# Patient Record
Sex: Female | Born: 1995 | Race: White | Hispanic: No | Marital: Single | Smoking: Former smoker
Health system: Southern US, Community
[De-identification: ages and names within clinical notes are randomized; demographics above are authoritative.]

## PROBLEM LIST (undated history)

## (undated) ENCOUNTER — Inpatient Hospital Stay (HOSPITAL_COMMUNITY): Payer: Self-pay

## (undated) DIAGNOSIS — R87619 Unspecified abnormal cytological findings in specimens from cervix uteri: Secondary | ICD-10-CM

## (undated) DIAGNOSIS — F419 Anxiety disorder, unspecified: Secondary | ICD-10-CM

## (undated) DIAGNOSIS — J45909 Unspecified asthma, uncomplicated: Secondary | ICD-10-CM

## (undated) DIAGNOSIS — A749 Chlamydial infection, unspecified: Secondary | ICD-10-CM

## (undated) DIAGNOSIS — IMO0002 Reserved for concepts with insufficient information to code with codable children: Secondary | ICD-10-CM

## (undated) DIAGNOSIS — N83209 Unspecified ovarian cyst, unspecified side: Secondary | ICD-10-CM

## (undated) DIAGNOSIS — E162 Hypoglycemia, unspecified: Secondary | ICD-10-CM

## (undated) DIAGNOSIS — O24419 Gestational diabetes mellitus in pregnancy, unspecified control: Secondary | ICD-10-CM

## (undated) DIAGNOSIS — A549 Gonococcal infection, unspecified: Secondary | ICD-10-CM

## (undated) HISTORY — PX: CYSTECTOMY: SHX5119

## (undated) HISTORY — PX: APPENDECTOMY: SHX54

## (undated) HISTORY — PX: WISDOM TOOTH EXTRACTION: SHX21

---
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
## 2013-03-10 ENCOUNTER — Emergency Department (INDEPENDENT_AMBULATORY_CARE_PROVIDER_SITE_OTHER)
Admission: EM | Admit: 2013-03-10 | Discharge: 2013-03-10 | Disposition: A | Discharge status: Home / Self Care | Payer: Medicaid Other | Source: Home / Self Care | Attending: Family Medicine | Admitting: Family Medicine

## 2013-03-10 ENCOUNTER — Emergency Department (INDEPENDENT_AMBULATORY_CARE_PROVIDER_SITE_OTHER): Payer: Medicaid Other

## 2013-03-10 ENCOUNTER — Encounter (HOSPITAL_COMMUNITY): Payer: Self-pay | Admitting: Emergency Medicine

## 2013-03-10 DIAGNOSIS — S60221A Contusion of right hand, initial encounter: Secondary | ICD-10-CM

## 2013-03-10 DIAGNOSIS — S60229A Contusion of unspecified hand, initial encounter: Secondary | ICD-10-CM

## 2013-03-10 MED ORDER — TRAMADOL HCL 50 MG PO TABS: 50.0000 mg | ORAL_TABLET | Freq: Four times a day (QID) | ORAL | Status: DC | PRN

## 2013-03-10 NOTE — ED Notes (Signed)
C/o right hand injury due to closing hand in car door today Tired icing hand for treatment

## 2013-03-10 NOTE — ED Provider Notes (Signed)
Brittany Williams is a 17 y.o. female who presents to Urgent Care today for right hand pain. Patient's hand was shut in a car door about 1 hour prior to presentation today. She notes pain and swelling especially across her radial wrist and metacarpal. She denies any radiating pain weakness or numbness. She took a Goody powder prior to presentation which has not helped much. No fevers or chills.   Past Medical History  Diagnosis Date  . Hypoglycemia   . Abnormal Pap smear   . Ovarian cyst   . Gonorrhea   . Chlamydia    History  Substance Use Topics  . Smoking status: Current Every Day Smoker -- 0.50 packs/day    Types: Cigarettes  . Smokeless tobacco: Not on file  . Alcohol Use: No   ROS as above Medications reviewed. No current facility-administered medications for this encounter.   Current Outpatient Prescriptions  Medication Sig Dispense Refill  . traMADol (ULTRAM) 50 MG tablet Take 1 tablet (50 mg total) by mouth every 6 (six) hours as needed.  15 tablet  0  . [DISCONTINUED] promethazine (PHENERGAN) 25 MG tablet Take 1 tablet (25 mg total) by mouth every 6 (six) hours as needed for nausea.  20 tablet  0    Exam:  BP 114/65  Pulse 80  Temp(Src) 98.4 F (36.9 C) (Oral)  Resp 14  SpO2 98%  LMP 02/24/2013 Gen: Well NAD Right hand: Swelling and ecchymosis present at the thenar eminence.  Tender across metacarpals 1-3 Tender across the wrist diffusely. Not particularly tender in the anatomical snuffbox.  Capillary refill and sensation are intact Motion of the fingers are intact thumb motion is slightly limited opposition otherwise normal.   No results found for this or any previous visit (from the past 24 hour(s)). Dg Wrist Complete Right  03/10/2013   CLINICAL DATA:  Right wrist injury and pain.  EXAM: RIGHT WRIST - COMPLETE 3+ VIEW  COMPARISON:  Plain films right hand 03/04/2007.  FINDINGS: Imaged bones, joints and soft tissues appear normal.  IMPRESSION: Negative exam.    Electronically Signed   By: Drusilla Kanner M.D.   On: 03/10/2013 13:38   Dg Hand Complete Right  03/10/2013   CLINICAL DATA:  Pain post injury  EXAM: RIGHT HAND - COMPLETE 3+ VIEW  COMPARISON:  03/04/2007  FINDINGS: Three views of right hand submitted. No acute fracture or subluxation. No radiopaque foreign body.  IMPRESSION: Negative.   Electronically Signed   By: Natasha Mead M.D.   On: 03/10/2013 13:31    Assessment and Plan: 17 y.o. female with contusion to the right hand. Plan to treat with NSAIDs, tramadol, and thumb spica splint. Followup at sports medicine if not improving.  Discussed warning signs or symptoms. Please see discharge instructions. Patient expresses understanding.      Rodolph Bong, MD 03/10/13 [phone]

## 2013-09-12 ENCOUNTER — Emergency Department (INDEPENDENT_AMBULATORY_CARE_PROVIDER_SITE_OTHER)
Admission: EM | Admit: 2013-09-12 | Discharge: 2013-09-12 | Disposition: A | Discharge status: Home / Self Care | Payer: Medicaid Other | Source: Home / Self Care

## 2013-09-12 ENCOUNTER — Encounter (HOSPITAL_COMMUNITY): Payer: Self-pay | Admitting: Emergency Medicine

## 2013-09-12 DIAGNOSIS — H109 Unspecified conjunctivitis: Secondary | ICD-10-CM

## 2013-09-12 MED ORDER — ERYTHROMYCIN 5 MG/GM OP OINT: TOPICAL_OINTMENT | OPHTHALMIC | Status: DC

## 2013-09-12 MED ORDER — KETOTIFEN FUMARATE 0.025 % OP SOLN: 1.0000 [drp] | Freq: Two times a day (BID) | OPHTHALMIC | Status: DC

## 2013-09-12 NOTE — ED Provider Notes (Signed)
CSN: [id number]     Arrival date & time 09/12/13  1215 History   First MD Initiated Contact with Patient 09/12/13 1258     Chief Complaint  Patient presents with  . Conjunctivitis   (Consider location/radiation/quality/duration/timing/severity/associated sxs/prior Treatment) HPI Comments: 2 d of bilat eye pain redness and soreness with drainage.   Past Medical History  Diagnosis Date  . Hypoglycemia   . Abnormal Pap smear   . Ovarian cyst   . Gonorrhea   . Chlamydia    Past Surgical History  Procedure Laterality Date  . Appendectomy    . Cystectomy    . Wisdom tooth extraction     Family History  Problem Relation Age of Onset  . Other Neg Hx    History  Substance Use Topics  . Smoking status: Current Every Day Smoker -- 0.50 packs/day    Types: Cigarettes  . Smokeless tobacco: Not on file  . Alcohol Use: No   OB History   Grav Para Term Preterm Abortions TAB SAB Ect Mult Living   0              Review of Systems  Constitutional: Negative.   Eyes: Positive for pain, discharge, redness and itching. Negative for visual disturbance.  All other systems reviewed and are negative.   Allergies  Latex  Home Medications   Prior to Admission medications   Medication Sig Start Date End Date Taking? Authorizing Provider  erythromycin ophthalmic ointment Place a 1/2 inch ribbon of ointment into the lower eyelid of ou tid. 09/12/13   Hayden Rasmussen, NP  ketotifen (ZADITOR) 0.025 % ophthalmic solution Place 1 drop into both eyes 2 (two) times daily. 09/12/13   Hayden Rasmussen, NP  traMADol (ULTRAM) 50 MG tablet Take 1 tablet (50 mg total) by mouth every 6 (six) hours as needed. 03/10/13   Rodolph Bong, MD   BP 108/76  Pulse 90  Temp(Src) 98.3 F (36.8 C) (Oral)  Resp 16  SpO2 98%  LMP 08/22/2013 Physical Exam  Nursing note and vitals reviewed. Constitutional: She appears well-developed and well-nourished. No distress.  Eyes: EOM are normal. Pupils are equal, round, and  reactive to light. Left eye exhibits discharge.  Bilat upper and lower lids red with inflammation, puffiness. Sclera mildly injected. Clear mucoid drainage.  Neck: Normal range of motion. Neck supple.  Neurological: She is alert.  Skin: Skin is warm and dry.    ED Course  Procedures (including critical care time) Labs Review Labs Reviewed - No data to display  Imaging Review No results found.   MDM   1. Bilateral conjunctivitis    Erythromycin op oint Warm compresses    Hayden Rasmussen, NP 09/12/13 1350

## 2013-09-12 NOTE — Discharge Instructions (Signed)

## 2013-09-12 NOTE — ED Notes (Signed)
C/O bilateral eye redness and drainage.  Irritation.    No relief with compresses.   otc meds tried.

## 2013-09-13 NOTE — ED Provider Notes (Signed)
Medical screening examination/treatment/procedure(s) were performed by non-physician practitioner and as supervising physician I was immediately available for consultation/collaboration.  Adamaris King, M.D.  Jola Critzer C Shamela Haydon, MD 09/13/13 0842 

## 2013-09-17 ENCOUNTER — Encounter (HOSPITAL_COMMUNITY): Payer: Self-pay | Admitting: Emergency Medicine

## 2013-09-17 ENCOUNTER — Emergency Department (HOSPITAL_COMMUNITY)
Admission: EM | Admit: 2013-09-17 | Discharge: 2013-09-17 | Disposition: A | Discharge status: Home / Self Care | Payer: Medicaid Other | Attending: Emergency Medicine | Admitting: Emergency Medicine

## 2013-09-17 DIAGNOSIS — H11429 Conjunctival edema, unspecified eye: Secondary | ICD-10-CM

## 2013-09-17 DIAGNOSIS — Z8619 Personal history of other infectious and parasitic diseases: Secondary | ICD-10-CM | POA: Insufficient documentation

## 2013-09-17 DIAGNOSIS — Z91040 Latex allergy status: Secondary | ICD-10-CM | POA: Insufficient documentation

## 2013-09-17 DIAGNOSIS — F17200 Nicotine dependence, unspecified, uncomplicated: Secondary | ICD-10-CM | POA: Insufficient documentation

## 2013-09-17 DIAGNOSIS — H109 Unspecified conjunctivitis: Secondary | ICD-10-CM

## 2013-09-17 DIAGNOSIS — Z862 Personal history of diseases of the blood and blood-forming organs and certain disorders involving the immune mechanism: Secondary | ICD-10-CM | POA: Insufficient documentation

## 2013-09-17 DIAGNOSIS — Z8639 Personal history of other endocrine, nutritional and metabolic disease: Secondary | ICD-10-CM | POA: Insufficient documentation

## 2013-09-17 MED ORDER — DIPHENHYDRAMINE HCL 25 MG PO CAPS
25.0000 mg | ORAL_CAPSULE | Freq: Once | ORAL | Status: AC
  Administered 2013-09-17: 25 mg via ORAL
  Filled 2013-09-17: qty 1

## 2013-09-17 MED ORDER — PROPARACAINE HCL 0.5 % OP SOLN
1.0000 [drp] | Freq: Once | OPHTHALMIC | Status: AC
  Administered 2013-09-17: 1 [drp] via OPHTHALMIC
  Filled 2013-09-17: qty 15

## 2013-09-17 MED ORDER — FLUORESCEIN SODIUM 1 MG OP STRP
1.0000 | ORAL_STRIP | Freq: Once | OPHTHALMIC | Status: AC
  Administered 2013-09-17: 1 via OPHTHALMIC
  Filled 2013-09-17: qty 1

## 2013-09-17 NOTE — ED Notes (Signed)
Pt c/o eye irritation and redness x 1 week. Pt tx for conjuctivitis on 5/30 now redness worse with swollen sclera to L eye. Pt reports burning with eye drops.

## 2013-09-17 NOTE — ED Provider Notes (Signed)
CSN: [id number]     Arrival date & time 09/17/13  0443 History   First MD Initiated Contact with Patient 09/17/13 [phone]     Chief Complaint  Patient presents with  . Eye Problem      HPI Patient states she's had some eye irritation and redness over the past week.  She was started on a bacterial eye ointment on May 30 she states that she's having some increasing redness to both of her eyes but her left greater than right.  She woke this morning and found increased swelling of her left eye conjunctiva was concerned and came to the ER for evaluation.  She reports when she puts in an antihistamine drops 2 or higher causes burning sensation of her.  She has no change in her vision.    Past Medical History  Diagnosis Date  . Hypoglycemia   . Abnormal Pap smear   . Ovarian cyst   . Gonorrhea   . Chlamydia    Past Surgical History  Procedure Laterality Date  . Appendectomy    . Cystectomy    . Wisdom tooth extraction     Family History  Problem Relation Age of Onset  . Other Neg Hx    History  Substance Use Topics  . Smoking status: Current Every Day Smoker -- 0.50 packs/day    Types: Cigarettes  . Smokeless tobacco: Not on file  . Alcohol Use: Yes   OB History   Grav Para Term Preterm Abortions TAB SAB Ect Mult Living   0              Review of Systems  All other systems reviewed and are negative.     Allergies  Latex  Home Medications   Prior to Admission medications   Not on File   BP 127/74  Pulse 101  Temp(Src) 98.4 F (36.9 C) (Oral)  Resp 18  Ht 5' (1.524 m)  Wt 135 lb (61.236 kg)  BMI 26.37 kg/m2  SpO2 99%  LMP 08/22/2013 Physical Exam  Nursing note and vitals reviewed. Constitutional: She is oriented to person, place, and time. She appears well-developed and well-nourished.  HENT:  Head: Normocephalic.  Eyes: EOM and lids are normal. Pupils are equal, round, and reactive to light. Lids are everted and swept, no foreign bodies found. Right eye  exhibits no chemosis, no discharge, no exudate and no hordeolum. No foreign body present in the right eye. Left eye exhibits chemosis. Left eye exhibits no discharge, no exudate and no hordeolum. No foreign body present in the left eye. Right conjunctiva is injected. Left conjunctiva is injected. No scleral icterus. Right eye exhibits normal extraocular motion. Left eye exhibits normal extraocular motion.  Slit lamp exam:      The left eye shows no corneal abrasion, no corneal flare, no corneal ulcer, no foreign body, no hyphema, no hypopyon and no fluorescein uptake.  Neck: Normal range of motion.  Pulmonary/Chest: Effort normal.  Abdominal: She exhibits no distension.  Musculoskeletal: Normal range of motion.  Neurological: She is alert and oriented to person, place, and time.  Psychiatric: She has a normal mood and affect.    ED Course  Procedures (including critical care time) Labs Review Labs Reviewed - No data to display  Imaging Review No results found.   EKG Interpretation None      MDM   Final diagnoses:  Chemosis  Conjunctivitis    Much of this may represent allergic reaction.  All medications we're  stopped the patient will followup with the ophthalmologist later today.  She understands return the ER for new or worsening symptoms.  The patient appears reasonably screened and/or stabilized for discharge and I doubt any other medical condition or other [facility] requiring further screening, evaluation, or treatment in the ED at this time prior to discharge.   Filed Vitals:   09/17/13 0611  BP: 127/74  Pulse: 101  Temp: 98.4 F (36.9 C)  Resp: [address]     Lyanne Co, MD 09/17/13 [phone]

## 2013-11-02 ENCOUNTER — Encounter (HOSPITAL_COMMUNITY): Payer: Self-pay | Admitting: Emergency Medicine

## 2013-11-02 ENCOUNTER — Emergency Department (HOSPITAL_COMMUNITY)
Admission: EM | Admit: 2013-11-02 | Discharge: 2013-11-02 | Disposition: A | Discharge status: Home / Self Care | Payer: Medicaid Other | Attending: Emergency Medicine | Admitting: Emergency Medicine

## 2013-11-02 DIAGNOSIS — Z23 Encounter for immunization: Secondary | ICD-10-CM | POA: Insufficient documentation

## 2013-11-02 DIAGNOSIS — A499 Bacterial infection, unspecified: Secondary | ICD-10-CM | POA: Insufficient documentation

## 2013-11-02 DIAGNOSIS — N390 Urinary tract infection, site not specified: Secondary | ICD-10-CM | POA: Diagnosis not present

## 2013-11-02 DIAGNOSIS — Y9389 Activity, other specified: Secondary | ICD-10-CM | POA: Insufficient documentation

## 2013-11-02 DIAGNOSIS — Z3202 Encounter for pregnancy test, result negative: Secondary | ICD-10-CM | POA: Diagnosis not present

## 2013-11-02 DIAGNOSIS — B9689 Other specified bacterial agents as the cause of diseases classified elsewhere: Secondary | ICD-10-CM | POA: Diagnosis not present

## 2013-11-02 DIAGNOSIS — Z792 Long term (current) use of antibiotics: Secondary | ICD-10-CM | POA: Diagnosis not present

## 2013-11-02 DIAGNOSIS — N760 Acute vaginitis: Secondary | ICD-10-CM | POA: Insufficient documentation

## 2013-11-02 DIAGNOSIS — F17200 Nicotine dependence, unspecified, uncomplicated: Secondary | ICD-10-CM | POA: Insufficient documentation

## 2013-11-02 DIAGNOSIS — T148XXA Other injury of unspecified body region, initial encounter: Secondary | ICD-10-CM

## 2013-11-02 DIAGNOSIS — Y929 Unspecified place or not applicable: Secondary | ICD-10-CM | POA: Insufficient documentation

## 2013-11-02 DIAGNOSIS — W268XXA Contact with other sharp object(s), not elsewhere classified, initial encounter: Secondary | ICD-10-CM | POA: Insufficient documentation

## 2013-11-02 DIAGNOSIS — Z79899 Other long term (current) drug therapy: Secondary | ICD-10-CM | POA: Diagnosis not present

## 2013-11-02 DIAGNOSIS — Z91040 Latex allergy status: Secondary | ICD-10-CM | POA: Diagnosis not present

## 2013-11-02 DIAGNOSIS — N949 Unspecified condition associated with female genital organs and menstrual cycle: Secondary | ICD-10-CM | POA: Diagnosis present

## 2013-11-02 DIAGNOSIS — IMO0002 Reserved for concepts with insufficient information to code with codable children: Secondary | ICD-10-CM | POA: Insufficient documentation

## 2013-11-02 LAB — WET PREP, GENITAL
Trich, Wet Prep: NONE SEEN
YEAST WET PREP: NONE SEEN

## 2013-11-02 LAB — URINALYSIS, ROUTINE W REFLEX MICROSCOPIC
Bilirubin Urine: NEGATIVE
GLUCOSE, UA: NEGATIVE mg/dL
HGB URINE DIPSTICK: NEGATIVE
KETONES UR: NEGATIVE mg/dL
Nitrite: NEGATIVE
PROTEIN: NEGATIVE mg/dL
Specific Gravity, Urine: 1.011 (ref 1.005–1.030)
Urobilinogen, UA: 0.2 mg/dL (ref 0.0–1.0)
pH: 8 (ref 5.0–8.0)

## 2013-11-02 LAB — URINE MICROSCOPIC-ADD ON

## 2013-11-02 LAB — HIV ANTIBODY (ROUTINE TESTING W REFLEX): HIV: NONREACTIVE

## 2013-11-02 LAB — RPR

## 2013-11-02 LAB — POC URINE PREG, ED: PREG TEST UR: NEGATIVE

## 2013-11-02 MED ORDER — AZITHROMYCIN 250 MG PO TABS
1000.0000 mg | ORAL_TABLET | Freq: Once | ORAL | Status: AC
  Administered 2013-11-02: 1000 mg via ORAL
  Filled 2013-11-02: qty 4

## 2013-11-02 MED ORDER — TETANUS-DIPHTH-ACELL PERTUSSIS 5-2.5-18.5 LF-MCG/0.5 IM SUSP
0.5000 mL | Freq: Once | INTRAMUSCULAR | Status: AC
  Administered 2013-11-02: 0.5 mL via INTRAMUSCULAR
  Filled 2013-11-02: qty 0.5

## 2013-11-02 MED ORDER — METRONIDAZOLE 500 MG PO TABS: 500.0000 mg | ORAL_TABLET | Freq: Two times a day (BID) | ORAL | Status: DC

## 2013-11-02 MED ORDER — CEPHALEXIN 500 MG PO CAPS: 500.0000 mg | ORAL_CAPSULE | Freq: Two times a day (BID) | ORAL | Status: DC

## 2013-11-02 MED ORDER — CEFTRIAXONE SODIUM 250 MG IJ SOLR
250.0000 mg | Freq: Once | INTRAMUSCULAR | Status: AC
  Administered 2013-11-02: 250 mg via INTRAMUSCULAR
  Filled 2013-11-02: qty 250

## 2013-11-02 NOTE — Discharge Instructions (Signed)
Please call your doctor for a followup appointment within 24-48 hours. When you talk to your doctor please let them know that you were seen in the emergency department and have them acquire all of your records so that they can discuss the findings with you and formulate a treatment plan to fully care for your new and ongoing problems. Please call and set-up an appointment with your primary care provider to be seen and re-assessed Please call and set-up an appointment with OBGYN to be re-assessed, important to continue with Pap smears Please rest and stay hydrated Please have partner checked Please take antibiotics as prescribed and take on a full stomach  Please avoid any sexual activity until infection cleared Please avoid any shaving of the area to prevent further irritation  Please apply neosporin or antibiotic ointment to aid in relief Please apply cool compressions Please continue to monitor symptoms closely and if symptoms are to worsen or change (fever greater than 101, chills, sweating, nausea, vomiting, stomach pain, diarrhea, headache, neck pain, back pain, red streaks, abscess formation, worsening or changes to vaginal discharge, vaginal pain) please report back to the ED immediately   Urinary Tract Infection Urinary tract infections (UTIs) can develop anywhere along your urinary tract. Your urinary tract is your body's drainage system for removing wastes and extra water. Your urinary tract includes two kidneys, two ureters, a bladder, and a urethra. Your kidneys are a pair of bean-shaped organs. Each kidney is about the size of your fist. They are located below your ribs, one on each side of your spine. CAUSES Infections are caused by microbes, which are microscopic organisms, including fungi, viruses, and bacteria. These organisms are so small that they can only be seen through a microscope. Bacteria are the microbes that most commonly cause UTIs. SYMPTOMS  Symptoms of UTIs may vary by  age and gender of the patient and by the location of the infection. Symptoms in young women typically include a frequent and intense urge to urinate and a painful, burning feeling in the bladder or urethra during urination. Older women and men are more likely to be tired, shaky, and weak and have muscle aches and abdominal pain. A fever may mean the infection is in your kidneys. Other symptoms of a kidney infection include pain in your back or sides below the ribs, nausea, and vomiting. DIAGNOSIS To diagnose a UTI, your caregiver will ask you about your symptoms. Your caregiver also will ask to provide a urine sample. The urine sample will be tested for bacteria and white blood cells. White blood cells are made by your body to help fight infection. TREATMENT  Typically, UTIs can be treated with medication. Because most UTIs are caused by a bacterial infection, they usually can be treated with the use of antibiotics. The choice of antibiotic and length of treatment depend on your symptoms and the type of bacteria causing your infection. HOME CARE INSTRUCTIONS  If you were prescribed antibiotics, take them exactly as your caregiver instructs you. Finish the medication even if you feel better after you have only taken some of the medication.  Drink enough water and fluids to keep your urine clear or pale yellow.  Avoid caffeine, tea, and carbonated beverages. They tend to irritate your bladder.  Empty your bladder often. Avoid holding urine for long periods of time.  Empty your bladder before and after sexual intercourse.  After a bowel movement, women should cleanse from front to back. Use each tissue only once. SEEK  MEDICAL CARE IF:   You have back pain.  You develop a fever.  Your symptoms do not begin to resolve within 3 days. SEEK IMMEDIATE MEDICAL CARE IF:   You have severe back pain or lower abdominal pain.  You develop chills.  You have nausea or vomiting.  You have continued  burning or discomfort with urination. MAKE SURE YOU:   Understand these instructions.  Will watch your condition.  Will get help right away if you are not doing well or get worse. Document Released: 01/10/2005 Document Revised: 10/02/2011 Document Reviewed: 05/11/2011 [facility] Patient Information 2015 [place], [place]. This information is not intended to replace advice given to you by your health care provider. Make sure you discuss any questions you have with your health care provider.  Bacterial Vaginosis Bacterial vaginosis is a vaginal infection that occurs when the normal balance of bacteria in the vagina is disrupted. It results from an overgrowth of certain bacteria. This is the most common vaginal infection in women of childbearing age. Treatment is important to prevent complications, especially in pregnant women, as it can cause a premature delivery. CAUSES  Bacterial vaginosis is caused by an increase in harmful bacteria that are normally present in smaller amounts in the vagina. Several different kinds of bacteria can cause bacterial vaginosis. However, the reason that the condition develops is not fully understood. RISK FACTORS Certain activities or behaviors can put you at an increased risk of developing bacterial vaginosis, including:  Having a new sex partner or multiple sex partners.  Douching.  Using an intrauterine device (IUD) for contraception. Women do not get bacterial vaginosis from toilet seats, bedding, swimming pools, or contact with objects around them. SIGNS AND SYMPTOMS  Some women with bacterial vaginosis have no signs or symptoms. Common symptoms include:  Grey vaginal discharge.  A fishlike odor with discharge, especially after sexual intercourse.  Itching or burning of the vagina and vulva.  Burning or pain with urination. DIAGNOSIS  Your health care provider will take a medical history and examine the vagina for signs of bacterial vaginosis. A  sample of vaginal fluid may be taken. Your health care provider will look at this sample under a microscope to check for bacteria and abnormal cells. A vaginal pH test may also be done.  TREATMENT  Bacterial vaginosis may be treated with antibiotic medicines. These may be given in the form of a pill or a vaginal cream. A second round of antibiotics may be prescribed if the condition comes back after treatment.  HOME CARE INSTRUCTIONS   Only take over-the-counter or prescription medicines as directed by your health care provider.  If antibiotic medicine was prescribed, take it as directed. Make sure you finish it even if you start to feel better.  Do not have sex until treatment is completed.  Tell all sexual partners that you have a vaginal infection. They should see their health care provider and be treated if they have problems, such as a mild rash or itching.  Practice safe sex by using condoms and only having one sex partner. SEEK MEDICAL CARE IF:   Your symptoms are not improving after 3 days of treatment.  You have increased discharge or pain.  You have a fever. MAKE SURE YOU:   Understand these instructions.  Will watch your condition.  Will get help right away if you are not doing well or get worse. FOR MORE INFORMATION  Centers for Disease Control and Prevention, Division of STD Prevention: SolutionApps.co.zawww.cdc.gov/std American Sexual  Health Association (ASHA): www.ashastd.org  Document Released: 04/02/2005 Document Revised: 01/21/2013 Document Reviewed: 11/12/2012 [facility] Patient Information 2015 [place], [place]. This information is not intended to replace advice given to you by your health care provider. Make sure you discuss any questions you have with your health care provider.   Emergency Department Resource Guide 1) Find a Doctor and Pay Out of Pocket Although you won't have to find out who is covered by your insurance plan, it is a good idea to ask around and get  recommendations. You will then need to call the office and see if the doctor you have chosen will accept you as a new patient and what types of options they offer for patients who are self-pay. Some doctors offer discounts or will set up payment plans for their patients who do not have insurance, but you will need to ask so you aren't surprised when you get to your appointment.  2) Contact Your Local Health Department Not all health departments have doctors that can see patients for sick visits, but many do, so it is worth a call to see if yours does. If you don't know where your local health department is, you can check in your phone book. The CDC also has a tool to help you locate your state's health department, and many state websites also have listings of all of their local health departments.  3) Find a Walk-in Clinic If your illness is not likely to be very severe or complicated, you may want to try a walk in clinic. These are popping up all over the country in pharmacies, drugstores, and shopping centers. They're usually staffed by nurse practitioners or physician assistants that have been trained to treat common illnesses and complaints. They're usually fairly quick and inexpensive. However, if you have serious medical issues or chronic medical problems, these are probably not your best option.  No Primary Care Doctor: - Call Health Connect at  [phone] - they can help you locate a primary care doctor that  accepts your insurance, provides certain services, etc. - Physician Referral Service- [phone]  Chronic Pain Problems: Organization         Address  Phone   Notes  [facility]  [phone] Patients need to be referred by their primary care doctor.   Medication Assistance: Organization         Address  Phone   Notes  [facility] Medication [facility] [address] [place] [place], [place] [address] [phone] --Must be a resident of  [facility] -- Must have NO insurance coverage whatsoever (no Medicaid/ Medicare, etc.) -- The pt. MUST have a primary care doctor that directs their care regularly and follows them in the community   MedAssist  [phone]   [employer]  [phone]    Agencies that provide inexpensive medical care: Organization         Address  Phone   Notes  [facility]  [phone]   [facility]    [phone]   [facility] [address] [place], [place] [address] [phone]   [facility] [address] [place], [place] [phone]   Planned Parenthood    [phone]   [facility]    [phone]   Community Health and [facility]  [address], [place] Phone:  [phone], Fax:  [phone] Hours of Operation:  9  am - 6 pm, M-F.  Also accepts Medicaid/Medicare and self-pay.  [facility] for [place] [place], [address], [place] Phone: [phone], Fax: [phone]. Hours of Operation:  8:30 am - 5:30 pm, M-F.  Also accepts Medicaid and self-pay.  [facility] [address], [place] Phone: [phone]   [place], [place], [place] [phone], Ext. [phone] Mondays & Thursdays: 7-9 AM.  First 15 patients are seen on a first come, first serve basis.    [place] Providers:  Organization         Address  Phone   Notes  [facility] [address], [place] [phone] Also accepts self-pay patients.  [facility] [address] [place], [place]  [phone]   [place], [address], [place] [phone]   [facility] [address], [place] [phone]   Lucianne Lei [address], [place]   [phone] Only accepts [place] Access  [place] patients after they have their name applied to their card.   Self-Pay (no insurance) in [facility]:  Organization         Address  Phone   Notes  Sickle Cell Patients, [facility] [place] [phone]   [facility] [place] [phone]   [facility] [place]  [place], [place], [place] [phone]   [facility]/Dr. Osei-Bonsu  [address], [place] or [address], [place] [phone] Phone number for both [place] and [place] locations is the same.  Urgent Medical and [facility] [address], [place] [phone]   [facility] [address], [place] or [address] [phone]   [facility] [address], [place] [phone], phone; [phone], fax Sees patients 1st and 3rd Saturday of every month.  Must not qualify for public or private insurance (i.e. Medicaid, Medicare, [place] Health Choice, Veterans' Benefits)  Household income should be no more than 200% of the poverty level The clinic cannot treat you if you are pregnant or think you are pregnant  Sexually transmitted diseases are not treated at the clinic.    Dental Care: Organization         Address  Phone  Notes  [facility] Department of [facility] [phone] Accepts children up to age 55 who are enrolled in [place] or [place]; pregnant women with a Medicaid card; and children who have applied for Medicaid or [place] Health Choice, but were declined, whose parents can pay a reduced fee at time of service.  [facility] Department of [facility]  [address], [place] [phone] Accepts children up to age 31 who are enrolled in [place] or [place]; pregnant women with a Medicaid  card; and children who have applied for Medicaid or [place] Health Choice, but were declined, whose parents can pay a reduced fee at time of service.  [place] Adult Dental Access PROGRAM  [place] [phone] Patients are seen by appointment only. Walk-ins are not accepted. [place] will see patients 18 years of  age and older. Monday - Tuesday (8am-5pm) Most Wednesdays (8:30-5pm) $30 per visit, cash only  [facility] Adult Dental Access PROGRAM  [address], [place] [facility] [phone] Patients are seen by appointment only. Walk-ins are not accepted. [place] will see patients 22 years of age and older. One Wednesday Evening (Monthly: Volunteer Based).  $30 per visit, cash only  [place]  [phone] for adults; Children under age 29, call Graduate Pediatric Dentistry at [phone]. Children aged 66-14, please call [phone] to request a pediatric application.  Dental services are provided in all areas of dental care including fillings, crowns and bridges, complete and partial dentures, implants, gum treatment, root canals, and extractions. Preventive care is also provided. Treatment is provided to both adults and children. Patients are selected via a lottery and there is often a waiting list.   [facility] [address], [place]  [phone] [email or web address]   [facility] [address] [place], [place] [phone], Ext. [phone] Second and Fourth Thursday of each month, opens at 6:30 AM; Clinic ends at 9 AM.  Patients are seen on a first-come first-served basis, and a limited number are seen during each clinic.   [facility]  [address] Hillard Danker [place], [place] [phone]   Eligibility Requirements You must have lived in [place], [place], or [place] counties for at least the last three months.   You cannot be eligible for state or federal sponsored [employer],  including [employer], [place], or [employer].   You generally cannot be eligible for healthcare insurance through your employer.    How to apply: Eligibility screenings are held every Tuesday and Wednesday afternoon from 1:00 pm until 4:00 pm. You do not need an appointment for the interview!  [facility] [address] [place], [place]   [place]  Leigh Department  [place]  [phone]    Behavioral Health Resources in the Community: Intensive Outpatient Programs Organization         Address  Phone  Notes  [place]. [address] [place], [place] [phone]   [facility] Outpatient [address] [place], [place]   ADS: Alcohol & Drug Svcs [address] [place], [place]   [place] [address],  [place], [place] or [phone]   Substance Abuse Resources Organization         Address  Phone  Notes  Alcohol and Drug Services  [phone]   [place]  [phone]   [place]   Chinita Pester  [phone]   Residential & Outpatient Substance Abuse Program  [phone]   Psychological Services Organization         Address  Phone  Notes  [employer] [place]  [place]  [phone]   [place] [address], [place] [phone] or [phone]    Mobile Crisis Teams Organization         Address  Phone  Notes  [facility], Mobile Crisis Care Unit  [phone]   [facility]  [address] [place], [place]   [facility] [address], [place] [phone]    Self-Help/Support Groups Organization         Address  Phone             Notes  Mental Health Assoc.  of [place] - variety of support groups  [place] Call for more information  Narcotics Anonymous (NA), [facility] [address], [employer] [place]  2 meetings at this location   Special educational needs teacher         Address  Phone  Notes  ASAP Residential Treatment [place],    [place]  [phone]   [facility]  [address], [place] [phone], [place], [place]   [place], [place] [phone] Admissions: 8am-3pm M-F  Incentives Substance [place] [address]    [place], [place] [phone]   [facility] [address] [place], [place], [place]   [facility] [address]  [place], [place]   [facility] Programs - Intensive Outpatient [address] Kristeen Mans [address], [place], [place]   [facility] ([place].) [place].,  [place], [place] [phone] or [phone]   [facility] Treatment Services (RTS) [address] [place], [place] Accepts Medicaid  [facility] [place] [address],  [place] [phone] Substance Abuse/Addiction Treatment   [facility] Organization         Address  Phone  Notes  [facility]  [phone]   Domenic Schwab, PhD [address] Arlis Porta [place], [place]   [phone] or [phone]   [place] Gam[place], [place] [phone]   [facility] [address] [place], [place] [phone] Insurance/Medicaid/sponsorship through [facility] and Families [address], [address]                                    [place], [place] [phone] [place] [address], [place] [phone]    Dr. Adele Schilder  [phone]   [facility] Dept. 1) [address] [place] 2)  [place] 3)  [place], [place] [phone]  [phone]   [place] [phone] or [phone] (After Hours)

## 2013-11-02 NOTE — ED Provider Notes (Signed)
CSN: [id number]     Arrival date & time 11/02/13  0545 History   First MD Initiated Contact with Patient 11/02/13 0606     Chief Complaint  Patient presents with  . Vaginal Pain     (Consider location/radiation/quality/duration/timing/severity/associated sxs/prior Treatment) The history is provided by the patient. No language interpreter was used.  Brittany Williams is a 18 y/o F with PMhx of hypoglycemia, abnormal pap smears, GC/Chlamydia, appendectomy presenting to the ED with vaginal discomfort after shaving on Friday. Patient reported that while shaving on Friday she noticed that she cut herself in her vaginal region, right sided labia majora. Stated that there has been a constant burning sensation and mild swelling noted to the region. Patient reported that the pain is worse when wearing her undergarments. Stated that wound started to itch the other day so she placed Kalamine lotion on it - stated that this made the pain worse. Reported that she placed Vasaline for comfort. Stated that she was told by her friend that her friend used the razor before her - patient reported that she normally does not share razors. Patient reported that she is sexually active and does not use protection and is not on birth control secondary to the birth control leading to abnormal Pap smear results. Patient reported that she has not followed up with a OBGYN secondary to being afraid of the outcome. Denied  fever, chills, chest pain, shortness of breath, difficulty breathing, bleeding, drainage, vaginal discharge, dysuria, hematuria, pelvic pain, nausea, vomiting, diarrhea, abdominal pain. PCP Dr. Concepcion ElkAvbuere  Past Medical History  Diagnosis Date  . Hypoglycemia   . Abnormal Pap smear   . Ovarian cyst   . Gonorrhea   . Chlamydia    Past Surgical History  Procedure Laterality Date  . Appendectomy    . Cystectomy    . Wisdom tooth extraction     Family History  Problem Relation Age of Onset  . Other Neg Hx   .  Hypertension Other   . Diabetes Other    History  Substance Use Topics  . Smoking status: Current Every Day Smoker -- 0.50 packs/day    Types: Cigarettes  . Smokeless tobacco: Not on file  . Alcohol Use: Yes     Comment: social   OB History   Grav Para Term Preterm Abortions TAB SAB Ect Mult Living   0              Review of Systems  Constitutional: Negative for fever and chills.  Respiratory: Negative for chest tightness and shortness of breath.   Cardiovascular: Negative for chest pain.  Gastrointestinal: Negative for nausea, vomiting, abdominal pain, diarrhea, constipation, blood in stool and anal bleeding.  Genitourinary: Positive for vaginal pain. Negative for dysuria, hematuria, vaginal bleeding, vaginal discharge and pelvic pain.  Musculoskeletal: Negative for back pain and neck pain.      Allergies  Latex  Home Medications   Prior to Admission medications   Medication Sig Start Date End Date Taking? Authorizing Provider  ibuprofen (ADVIL,MOTRIN) 800 MG tablet Take 800 mg by mouth every 8 (eight) hours as needed (pain).   Yes Historical Provider, MD  cephALEXin (KEFLEX) 500 MG capsule Take 1 capsule (500 mg total) by mouth 2 (two) times daily. 11/02/13   Roshan Roback, PA-C  metroNIDAZOLE (FLAGYL) 500 MG tablet Take 1 tablet (500 mg total) by mouth 2 (two) times daily. 11/02/13   Keelin Neville, PA-C   BP 103/62  Pulse 65  Temp(Src)  97.5 F (36.4 C) (Oral)  Resp 16  SpO2 100%  LMP 10/25/2013 Physical Exam  Nursing note and vitals reviewed. Constitutional: She is oriented to person, place, and time. She appears well-developed and well-nourished. No distress.  HENT:  Head: Normocephalic and atraumatic.  Mouth/Throat: Oropharynx is clear and moist. No oropharyngeal exudate.  Eyes: Conjunctivae and EOM are normal. Pupils are equal, round, and reactive to light. Right eye exhibits no discharge. Left eye exhibits no discharge.  Neck: Normal range of motion. Neck  supple. No tracheal deviation present.  Negative neck stiffness Negative nuchal rigidity  Negative cervical lymphadenopathy  Negative meningeal signs   Cardiovascular: Normal rate, regular rhythm and normal heart sounds.  Exam reveals no friction rub.   No murmur heard. Pulmonary/Chest: Effort normal and breath sounds normal. No respiratory distress. She has no wheezes. She has no rales.  Patient is able to speak in full sentences without difficulty Negative use of accessory muscles Negative stridor   Abdominal: Soft. Bowel sounds are normal. She exhibits no distension. There is no tenderness. There is no rebound and no guarding.  Negative abdominal distension BS normoactive in all 4 quadrants Abdomen soft upon palpation  Negative peritoneal signs Negative rigidity or guarding noted   Genitourinary:     Pelvic Exam: Mild erythema with superficial abrasions noted to the superior aspect of the right labia majora with negative active drainage or bleeding noted. Negative fluctuance upon palpation. Negative discomfort upon palpation to the region. Region of superficial abrasions is circular measuring approximately 0.5 cm x 0.5 cm. Negative red streaks. Negative signs of further infection. Doubt cellulitis. Negative sores, masses, deformities identified sectioning genitalia. Negative swelling, erythema, inflammation, lesions, sores, deformities, cysts noted to the vaginal canal. Cervix identified with negative friability-negative abnormalities noted. Mild thick white discharge noted nonodorous. Negative CMT. Negative bilateral adnexal tenderness. Exam chaperoned with nurse  Musculoskeletal: Normal range of motion.  Full ROM to upper and lower extremities without difficulty noted, negative ataxia noted.  Lymphadenopathy:    She has no cervical adenopathy.  Neurological: She is alert and oriented to person, place, and time. No cranial nerve deficit. She exhibits normal muscle tone. Coordination  normal.  Cranial nerves III-XII grossly intact Negative facial drooping Negative slurred speech  Negative aphasia  Skin: Skin is warm and dry. No rash noted. She is not diaphoretic. No erythema.  Psychiatric: She has a normal mood and affect. Her behavior is normal. Thought content normal.    ED Course  Procedures (including critical care time)  Results for orders placed during the hospital encounter of 11/02/13  WET PREP, GENITAL      Result Value Ref Range   Yeast Wet Prep HPF POC NONE SEEN  NONE SEEN   Trich, Wet Prep NONE SEEN  NONE SEEN   Clue Cells Wet Prep HPF POC FEW (*) NONE SEEN   WBC, Wet Prep HPF POC FEW (*) NONE SEEN  URINALYSIS, ROUTINE W REFLEX MICROSCOPIC      Result Value Ref Range   Color, Urine YELLOW  YELLOW   APPearance TURBID (*) CLEAR   Specific Gravity, Urine 1.011  1.005 - 1.030   pH 8.0  5.0 - 8.0   Glucose, UA NEGATIVE  NEGATIVE mg/dL   Hgb urine dipstick NEGATIVE  NEGATIVE   Bilirubin Urine NEGATIVE  NEGATIVE   Ketones, ur NEGATIVE  NEGATIVE mg/dL   Protein, ur NEGATIVE  NEGATIVE mg/dL   Urobilinogen, UA 0.2  0.0 - 1.0 mg/dL   Nitrite NEGATIVE  NEGATIVE   Leukocytes, UA MODERATE (*) NEGATIVE  URINE MICROSCOPIC-ADD ON      Result Value Ref Range   WBC, UA 7-10  <3 WBC/hpf   Urine-Other AMORPHOUS URATES/PHOSPHATES    POC URINE PREG, ED      Result Value Ref Range   Preg Test, Ur NEGATIVE  NEGATIVE    Labs Review Labs Reviewed  WET PREP, GENITAL - Abnormal; Notable for the following:    Clue Cells Wet Prep HPF POC FEW (*)    WBC, Wet Prep HPF POC FEW (*)    All other components within normal limits  URINALYSIS, ROUTINE W REFLEX MICROSCOPIC - Abnormal; Notable for the following:    APPearance TURBID (*)    Leukocytes, UA MODERATE (*)    All other components within normal limits  GC/CHLAMYDIA PROBE AMP  URINE MICROSCOPIC-ADD ON  RPR  HIV ANTIBODY (ROUTINE TESTING)  POC URINE PREG, ED    Imaging Review No results found.   EKG  Interpretation None      MDM   Final diagnoses:  Urinary tract infection without hematuria, site unspecified  BV (bacterial vaginosis)  Superficial abrasion   Medications  Tdap (BOOSTRIX) injection 0.5 mL (0.5 mLs Intramuscular Given 11/02/13 0752)  azithromycin (ZITHROMAX) tablet 1,000 mg (1,000 mg Oral Given 11/02/13 0751)  cefTRIAXone (ROCEPHIN) injection 250 mg (250 mg Intramuscular Given 11/02/13 0752)   Filed Vitals:   11/02/13 0550 11/02/13 0748  BP: 116/76 103/62  Pulse: 77 65  Temp: 97.7 F (36.5 C) 97.5 F (36.4 C)  TempSrc: Oral Oral  Resp: 18 16  SpO2: 100% 100%   Patient agreed to STD testing.  Urine pregnancy negative. Urinalysis noted moderate leukocytes with white blood cell count of 7-10 - negative hemoglobin, nitrites identified. Wet prep identified few clue cells and a few white blood cells. HIV, RPR, GC/Chlamydia pending. Urine culture pending. Doubt TOA. Doubt ovarian torsion. Doubt ectopic pregnancy. Doubt pyelonephritis. Doubt abscess. Patient presenting with superficial abrasion to the right labia majora measuring approximately 0.5 x 0.5 cm, circular region. Negative fluctuance upon palpation. Negative findings of cellulitis. Incidental urinary tract infection and BV identified. Negative findings of trichomoniasis. Patient sexually active will cover patient for STD. Patient given tetanus booster secondary to sharing of razors and patient does not remember the last time her tetanus was performed. Patient stable, afebrile. Patient not septic appearing. Discharged patient. Discharged patient with antibiotics to be treated for UTI and BV. Discussed with patient to apply Neosporin/bacitracin ointment. Discussed with patient to avoid shaving until fully healed. Discussed with patient the importance of Pap smears and the importance of following up with OBGYN. Referred to PCP and OBGYN. Discussed with patient to closely monitor symptoms and if symptoms are to worsen or  change to report back to the ED - strict return instructions given.  Patient agreed to plan of care, understood, all questions answered.   Raymon Mutton, PA-C 11/02/13 1635

## 2013-11-02 NOTE — ED Notes (Signed)
Patient states she cut herself while shaving. Small knick to labia noted during witness of pelvic exam. Patient denies taking in medication for discomfort PTA.

## 2013-11-02 NOTE — ED Notes (Signed)
Pt states she was shaving the other day and cut herself  Pt states the area is not healing and is very painful

## 2013-11-03 LAB — GC/CHLAMYDIA PROBE AMP
CT PROBE, AMP APTIMA: NEGATIVE
GC Probe RNA: NEGATIVE

## 2013-11-04 LAB — URINE CULTURE

## 2013-11-05 ENCOUNTER — Telehealth (HOSPITAL_BASED_OUTPATIENT_CLINIC_OR_DEPARTMENT_OTHER): Payer: Self-pay | Admitting: Emergency Medicine

## 2013-11-05 NOTE — Telephone Encounter (Signed)
Post ED Visit - Positive Culture Follow-up  Culture report reviewed by antimicrobial stewardship pharmacist: [] Wes Dulaney, Pharm.D., BCPS [] Jeremy Frens, Pharm.D., BCPS [] Elizabeth Martin, Pharm.D., BCPS [x] Minh Pham, Pharm.D., BCPS, AAHIVP [] Michelle Turner, Pharm.D., BCPS, AAHIVP  Positive urine culture Treated with Keflex, organism sensitive to the same and no further patient follow-up is required at this time.  Samella Lucchetti 11/05/2013, 6:49 PM   

## 2013-11-05 NOTE — ED Provider Notes (Signed)
Medical screening examination/treatment/procedure(s) were performed by non-physician practitioner and as supervising physician I was immediately available for consultation/collaboration.   Yides Saidi David Malaiyah Achorn III, MD 11/05/13 0859 

## 2013-12-20 ENCOUNTER — Encounter (HOSPITAL_COMMUNITY): Payer: Self-pay | Admitting: Emergency Medicine

## 2013-12-20 ENCOUNTER — Emergency Department (HOSPITAL_COMMUNITY)
Admission: EM | Admit: 2013-12-20 | Discharge: 2013-12-20 | Disposition: A | Discharge status: Home / Self Care | Payer: Medicaid Other | Attending: Emergency Medicine | Admitting: Emergency Medicine

## 2013-12-20 DIAGNOSIS — Z3202 Encounter for pregnancy test, result negative: Secondary | ICD-10-CM | POA: Insufficient documentation

## 2013-12-20 DIAGNOSIS — N390 Urinary tract infection, site not specified: Secondary | ICD-10-CM

## 2013-12-20 DIAGNOSIS — J45909 Unspecified asthma, uncomplicated: Secondary | ICD-10-CM | POA: Insufficient documentation

## 2013-12-20 DIAGNOSIS — Z8742 Personal history of other diseases of the female genital tract: Secondary | ICD-10-CM | POA: Insufficient documentation

## 2013-12-20 DIAGNOSIS — F17200 Nicotine dependence, unspecified, uncomplicated: Secondary | ICD-10-CM | POA: Insufficient documentation

## 2013-12-20 DIAGNOSIS — H9209 Otalgia, unspecified ear: Secondary | ICD-10-CM | POA: Insufficient documentation

## 2013-12-20 DIAGNOSIS — Z8639 Personal history of other endocrine, nutritional and metabolic disease: Secondary | ICD-10-CM | POA: Insufficient documentation

## 2013-12-20 DIAGNOSIS — R110 Nausea: Secondary | ICD-10-CM | POA: Insufficient documentation

## 2013-12-20 DIAGNOSIS — H9201 Otalgia, right ear: Secondary | ICD-10-CM

## 2013-12-20 DIAGNOSIS — R188 Other ascites: Secondary | ICD-10-CM | POA: Insufficient documentation

## 2013-12-20 DIAGNOSIS — R1032 Left lower quadrant pain: Secondary | ICD-10-CM

## 2013-12-20 DIAGNOSIS — Z862 Personal history of diseases of the blood and blood-forming organs and certain disorders involving the immune mechanism: Secondary | ICD-10-CM | POA: Insufficient documentation

## 2013-12-20 DIAGNOSIS — Z91040 Latex allergy status: Secondary | ICD-10-CM | POA: Insufficient documentation

## 2013-12-20 DIAGNOSIS — Z8619 Personal history of other infectious and parasitic diseases: Secondary | ICD-10-CM | POA: Insufficient documentation

## 2013-12-20 HISTORY — DX: Unspecified asthma, uncomplicated: J45.909

## 2013-12-20 LAB — URINALYSIS, ROUTINE W REFLEX MICROSCOPIC
Bilirubin Urine: NEGATIVE
GLUCOSE, UA: NEGATIVE mg/dL
HGB URINE DIPSTICK: NEGATIVE
KETONES UR: NEGATIVE mg/dL
Nitrite: NEGATIVE
Protein, ur: NEGATIVE mg/dL
SPECIFIC GRAVITY, URINE: 1.021 (ref 1.005–1.030)
Urobilinogen, UA: 0.2 mg/dL (ref 0.0–1.0)
pH: 7 (ref 5.0–8.0)

## 2013-12-20 LAB — URINE MICROSCOPIC-ADD ON

## 2013-12-20 LAB — WET PREP, GENITAL
TRICH WET PREP: NONE SEEN
YEAST WET PREP: NONE SEEN

## 2013-12-20 LAB — PREGNANCY, URINE: Preg Test, Ur: NEGATIVE

## 2013-12-20 MED ORDER — ANTIPYRINE-BENZOCAINE 5.4-1.4 % OT SOLN: 3.0000 [drp] | OTIC | Status: DC | PRN

## 2013-12-20 MED ORDER — LIDOCAINE HCL 1 % IJ SOLN
INTRAMUSCULAR | Status: AC
  Administered 2013-12-20: 0.9 mL via INTRAMUSCULAR
  Filled 2013-12-20: qty 20

## 2013-12-20 MED ORDER — AZITHROMYCIN 250 MG PO TABS
1000.0000 mg | ORAL_TABLET | Freq: Once | ORAL | Status: AC
  Administered 2013-12-20: 1000 mg via ORAL
  Filled 2013-12-20: qty 4

## 2013-12-20 MED ORDER — NITROFURANTOIN MONOHYD MACRO 100 MG PO CAPS: 100.0000 mg | ORAL_CAPSULE | Freq: Two times a day (BID) | ORAL | Status: DC

## 2013-12-20 MED ORDER — CEFTRIAXONE SODIUM 250 MG IJ SOLR
250.0000 mg | Freq: Once | INTRAMUSCULAR | Status: AC
  Administered 2013-12-20: 250 mg via INTRAMUSCULAR
  Filled 2013-12-20: qty 250

## 2013-12-20 MED ORDER — ACETAMINOPHEN 325 MG PO TABS
650.0000 mg | ORAL_TABLET | Freq: Once | ORAL | Status: AC
  Administered 2013-12-20: 650 mg via ORAL
  Filled 2013-12-20: qty 2

## 2013-12-20 MED ORDER — OXYCODONE-ACETAMINOPHEN 5-325 MG PO TABS: 1.0000 | ORAL_TABLET | Freq: Four times a day (QID) | ORAL | Status: DC | PRN

## 2013-12-20 NOTE — ED Notes (Signed)
Pt states she is having left lower quadrant pain for the past 2 weeks  Pt states the pain increases with sex and ambulation  Pt states she has an ear infection in her right ear for the past week and has a cough that she has had for the past 4 to 5 days  Pt states this morning she woke up feeling short of breath  Pt states she has been taking organic medication but it is not helping

## 2013-12-20 NOTE — ED Provider Notes (Signed)
CSN: [id number]     Arrival date & time 12/20/13  0615 History   First MD Initiated Contact with Patient 12/20/13 [phone]     Chief Complaint  Patient presents with  . Abdominal Pain  . Otalgia  . Cough     (Consider location/radiation/quality/duration/timing/severity/associated sxs/prior Treatment) Patient is a 18 y.o. female presenting with abdominal pain, ear pain, and cough. The history is provided by the patient.  Abdominal Pain Pain location:  LLQ Pain quality: sharp   Pain radiates to:  Does not radiate Pain severity:  Moderate Onset quality:  Sudden Duration:  2 weeks Timing:  Intermittent Progression:  Waxing and waning Chronicity:  Recurrent Relieved by:  Nothing Worsened by:  Movement Ineffective treatments:  None tried Associated symptoms: cough and nausea   Associated symptoms: no chest pain, no diarrhea, no dysuria, no fatigue, no fever, no hematuria, no shortness of breath and no vomiting   Otalgia Location:  Right Quality:  Aching Severity:  Mild Onset quality:  Gradual Duration:  1 week Timing:  Constant Associated symptoms: abdominal pain, congestion and cough   Associated symptoms: no diarrhea, no fever, no headaches, no neck pain and no vomiting   Cough Associated symptoms: ear pain   Associated symptoms: no chest pain, no fever, no headaches and no shortness of breath     Past Medical History  Diagnosis Date  . Hypoglycemia   . Abnormal Pap smear   . Ovarian cyst   . Gonorrhea   . Chlamydia   . Asthma    Past Surgical History  Procedure Laterality Date  . Appendectomy    . Cystectomy    . Wisdom tooth extraction     Family History  Problem Relation Age of Onset  . Other Neg Hx   . Hypertension Other   . Diabetes Other    History  Substance Use Topics  . Smoking status: Current Some Day Smoker -- 0.50 packs/day    Types: Cigarettes  . Smokeless tobacco: Not on file  . Alcohol Use: Yes     Comment: social   OB History   Grav Para  Term Preterm Abortions TAB SAB Ect Mult Living   0              Review of Systems  Constitutional: Negative for fever and fatigue.  HENT: Positive for congestion and ear pain. Negative for drooling.   Eyes: Negative for pain.  Respiratory: Positive for cough. Negative for shortness of breath.   Cardiovascular: Negative for chest pain.  Gastrointestinal: Positive for nausea and abdominal pain. Negative for vomiting and diarrhea.  Genitourinary: Negative for dysuria and hematuria.  Musculoskeletal: Negative for back pain, gait problem and neck pain.  Skin: Negative for color change.  Neurological: Negative for dizziness and headaches.  Hematological: Negative for adenopathy.  Psychiatric/Behavioral: Negative for behavioral problems.  All other systems reviewed and are negative.     Allergies  Latex  Home Medications   Prior to Admission medications   Not on File   BP 122/68  Pulse 82  Temp(Src) 98.3 F (36.8 C) (Oral)  Resp 19  Ht 5' (1.524 m)  Wt 130 lb (58.968 kg)  BMI 25.39 kg/m2  SpO2 98%  LMP 12/02/2013 Physical Exam  Nursing note and vitals reviewed. Constitutional: She is oriented to person, place, and time. She appears well-developed and well-nourished.  HENT:  Head: Normocephalic and atraumatic.  Mouth/Throat: Oropharynx is clear and moist. No oropharyngeal exudate.  Serous effusion noted behind right  TM. Normal appearing left TM.   Eyes: Conjunctivae and EOM are normal. Pupils are equal, round, and reactive to light.  Neck: Normal range of motion. Neck supple.  Cardiovascular: Normal rate, regular rhythm, normal heart sounds and intact distal pulses.  Exam reveals no gallop and no friction rub.   No murmur heard. Pulmonary/Chest: Effort normal and breath sounds normal. No respiratory distress. She has no wheezes. She exhibits tenderness (anterior chest wall ttp).  Abdominal: Soft. Bowel sounds are normal. There is tenderness (mild ttp of LLq). There is no  rebound and no guarding.  Genitourinary:  Normal appearing external vagina. Normal-appearing cervix. Os closed. Small amount of milky fluid in the posterior fornix. No significant cervical motion tenderness. Mild left adnexal tenderness to palpation.  Musculoskeletal: Normal range of motion. She exhibits no edema and no tenderness.  Neurological: She is alert and oriented to person, place, and time.  Skin: Skin is warm and dry.  Psychiatric: She has a normal mood and affect. Her behavior is normal.    ED Course  Procedures (including critical care time) Labs Review Labs Reviewed  WET PREP, GENITAL - Abnormal; Notable for the following:    Clue Cells Wet Prep HPF POC FEW (*)    WBC, Wet Prep HPF POC FEW (*)    All other components within normal limits  URINALYSIS, ROUTINE W REFLEX MICROSCOPIC - Abnormal; Notable for the following:    APPearance TURBID (*)    Leukocytes, UA MODERATE (*)    All other components within normal limits  URINE MICROSCOPIC-ADD ON - Abnormal; Notable for the following:    Squamous Epithelial / LPF MANY (*)    Bacteria, UA MANY (*)    All other components within normal limits  URINE CULTURE  GC/CHLAMYDIA PROBE AMP  PREGNANCY, URINE    Imaging Review No results found.   EKG Interpretation None      MDM   Final diagnoses:  UTI (lower urinary tract infection)  LLQ pain  Otalgia, right    7:36 AM 18 y.o. female with a history of left ovarian cyst who presents with left lower quadrant pain. She states that she developed left lower quadrant pain 2 weeks ago which has been intermittent since that time. Pain is consistent with previous ovarian cyst. She also has a self-reported history of gonorrhea and Chlamydia. She denies any fevers, vomiting, or diarrhea. She also complains of a mild cough with chest wall pain during coughing. She also complains of otalgia of the right ear and nasal congestion. She is afebrile and vital signs are unremarkable here. I  offered and ultrasound to confirm the presence of an ovarian cyst but she declined. I think this is reasonable as her abdomen is soft and her symptoms are consistent with previous ovarian cysts. Will perform pelvic exam.  8:21 AM: UA suspicious for UTI, will tx. Pt would like empiric tx for STD's, will cover w/ rocephin/azithro. LLQ pain likely ovarian cyst, will provide pain control. Do not think this is PID or ovarian torsion or TOA given well appearance, mild/intermittent sx, normal VS.  I have discussed the diagnosis/risks/treatment options with the patient and believe the pt to be eligible for discharge home to follow-up with her pcp as needed. We also discussed returning to the ED immediately if new or worsening sx occur. We discussed the sx which are most concerning (e.g., worsening pain, fever) that necessitate immediate return. Medications administered to the patient during their visit and any new prescriptions provided to  the patient are listed below.  Medications given during this visit Medications  cefTRIAXone (ROCEPHIN) injection 250 mg (not administered)  azithromycin (ZITHROMAX) tablet 1,000 mg (not administered)  acetaminophen (TYLENOL) tablet 650 mg (650 mg Oral Given 12/20/13 0741)    New Prescriptions   ANTIPYRINE-BENZOCAINE (A/B OTIC) OTIC SOLUTION    Place 3-4 drops into the right ear every 2 (two) hours as needed for ear pain.   NITROFURANTOIN, MACROCRYSTAL-MONOHYDRATE, (MACROBID) 100 MG CAPSULE    Take 1 capsule (100 mg total) by mouth 2 (two) times daily.   OXYCODONE-ACETAMINOPHEN (PERCOCET) 5-325 MG PER TABLET    Take 1 tablet by mouth every 6 (six) hours as needed for moderate pain.     Purvis Sheffield, MD 12/20/13 [phone]

## 2013-12-20 NOTE — Discharge Instructions (Signed)
Abdominal Pain, Women °Abdominal (stomach, pelvic, or belly) pain can be caused by many things. It is important to tell your doctor: °· The location of the pain. °· Does it come and go or is it present all the time? °· Are there things that start the pain (eating certain foods, exercise)? °· Are there other symptoms associated with the pain (fever, nausea, vomiting, diarrhea)? °All of this is helpful to know when trying to find the cause of the pain. °CAUSES  °· Stomach: virus or bacteria infection, or ulcer. °· Intestine: appendicitis (inflamed appendix), regional ileitis (Crohn's disease), ulcerative colitis (inflamed colon), irritable bowel syndrome, diverticulitis (inflamed diverticulum of the colon), or cancer of the stomach or intestine. °· Gallbladder disease or stones in the gallbladder. °· Kidney disease, kidney stones, or infection. °· Pancreas infection or cancer. °· Fibromyalgia (pain disorder). °· Diseases of the female organs: °¨ Uterus: fibroid (non-cancerous) tumors or infection. °¨ Fallopian tubes: infection or tubal pregnancy. °¨ Ovary: cysts or tumors. °¨ Pelvic adhesions (scar tissue). °¨ Endometriosis (uterus lining tissue growing in the pelvis and on the pelvic organs). °¨ Pelvic congestion syndrome (female organs filling up with blood just before the menstrual period). °¨ Pain with the menstrual period. °¨ Pain with ovulation (producing an egg). °¨ Pain with an IUD (intrauterine device, birth control) in the uterus. °¨ Cancer of the female organs. °· Functional pain (pain not caused by a disease, may improve without treatment). °· Psychological pain. °· Depression. °DIAGNOSIS  °Your doctor will decide the seriousness of your pain by doing an examination. °· Blood tests. °· X-rays. °· Ultrasound. °· CT scan (computed tomography, special type of X-ray). °· MRI (magnetic resonance imaging). °· Cultures, for infection. °· Barium enema (dye inserted in the large intestine, to better view it with  X-rays). °· Colonoscopy (looking in intestine with a lighted tube). °· Laparoscopy (minor surgery, looking in abdomen with a lighted tube). °· Major abdominal exploratory surgery (looking in abdomen with a large incision). °TREATMENT  °The treatment will depend on the cause of the pain.  °· Many cases can be observed and treated at home. °· Over-the-counter medicines recommended by your caregiver. °· Prescription medicine. °· Antibiotics, for infection. °· Birth control pills, for painful periods or for ovulation pain. °· Hormone treatment, for endometriosis. °· Nerve blocking injections. °· Physical therapy. °· Antidepressants. °· Counseling with a psychologist or psychiatrist. °· Minor or major surgery. °HOME CARE INSTRUCTIONS  °· Do not take laxatives, unless directed by your caregiver. °· Take over-the-counter pain medicine only if ordered by your caregiver. Do not take aspirin because it can cause an upset stomach or bleeding. °· Try a clear liquid diet (broth or water) as ordered by your caregiver. Slowly move to a bland diet, as tolerated, if the pain is related to the stomach or intestine. °· Have a thermometer and take your temperature several times a day, and record it. °· Bed rest and sleep, if it helps the pain. °· Avoid sexual intercourse, if it causes pain. °· Avoid stressful situations. °· Keep your follow-up appointments and tests, as your caregiver orders. °· If the pain does not go away with medicine or surgery, you may try: °¨ Acupuncture. °¨ Relaxation exercises (yoga, meditation). °¨ Group therapy. °¨ Counseling. °SEEK MEDICAL CARE IF:  °· You notice certain foods cause stomach pain. °· Your home care treatment is not helping your pain. °· You need stronger pain medicine. °· You want your IUD removed. °· You feel faint or   lightheaded. °· You develop nausea and vomiting. °· You develop a rash. °· You are having side effects or an allergy to your medicine. °SEEK IMMEDIATE MEDICAL CARE IF:  °· Your  pain does not go away or gets worse. °· You have a fever. °· Your pain is felt only in portions of the abdomen. The right side could possibly be appendicitis. The left lower portion of the abdomen could be colitis or diverticulitis. °· You are passing blood in your stools (bright red or black tarry stools, with or without vomiting). °· You have blood in your urine. °· You develop chills, with or without a fever. °· You pass out. °MAKE SURE YOU:  °· Understand these instructions. °· Will watch your condition. °· Will get help right away if you are not doing well or get worse. °Document Released: 01/28/2007 Document Revised: 08/17/2013 Document Reviewed: 02/17/2009 °[employer]® Patient Information ©2015 [employer]. This information is not intended to replace advice given to you by your health care provider. Make sure you discuss any questions you have with your health care provider. ° °

## 2013-12-22 LAB — URINE CULTURE
Colony Count: >=100000
SPECIAL REQUESTS: NORMAL

## 2013-12-22 LAB — GC/CHLAMYDIA PROBE AMP
CT Probe RNA: NEGATIVE
GC PROBE AMP APTIMA: NEGATIVE

## 2013-12-23 ENCOUNTER — Telehealth (HOSPITAL_BASED_OUTPATIENT_CLINIC_OR_DEPARTMENT_OTHER): Payer: Self-pay | Admitting: Emergency Medicine

## 2013-12-29 ENCOUNTER — Emergency Department (HOSPITAL_COMMUNITY)
Admission: EM | Admit: 2013-12-29 | Discharge: 2013-12-29 | Disposition: A | Discharge status: Home / Self Care | Payer: Medicaid Other | Attending: Emergency Medicine | Admitting: Emergency Medicine

## 2013-12-29 ENCOUNTER — Encounter (HOSPITAL_COMMUNITY): Payer: Self-pay | Admitting: Emergency Medicine

## 2013-12-29 DIAGNOSIS — F17200 Nicotine dependence, unspecified, uncomplicated: Secondary | ICD-10-CM | POA: Insufficient documentation

## 2013-12-29 DIAGNOSIS — Z9089 Acquired absence of other organs: Secondary | ICD-10-CM | POA: Insufficient documentation

## 2013-12-29 DIAGNOSIS — Z862 Personal history of diseases of the blood and blood-forming organs and certain disorders involving the immune mechanism: Secondary | ICD-10-CM | POA: Insufficient documentation

## 2013-12-29 DIAGNOSIS — Z91040 Latex allergy status: Secondary | ICD-10-CM | POA: Insufficient documentation

## 2013-12-29 DIAGNOSIS — Z8639 Personal history of other endocrine, nutritional and metabolic disease: Secondary | ICD-10-CM | POA: Insufficient documentation

## 2013-12-29 DIAGNOSIS — Z8619 Personal history of other infectious and parasitic diseases: Secondary | ICD-10-CM | POA: Insufficient documentation

## 2013-12-29 DIAGNOSIS — R1032 Left lower quadrant pain: Secondary | ICD-10-CM | POA: Insufficient documentation

## 2013-12-29 DIAGNOSIS — Z8742 Personal history of other diseases of the female genital tract: Secondary | ICD-10-CM | POA: Insufficient documentation

## 2013-12-29 DIAGNOSIS — R1030 Lower abdominal pain, unspecified: Secondary | ICD-10-CM

## 2013-12-29 MED ORDER — IBUPROFEN 800 MG PO TABS
800.0000 mg | ORAL_TABLET | Freq: Once | ORAL | Status: AC
  Administered 2013-12-29: 800 mg via ORAL
  Filled 2013-12-29: qty 1

## 2013-12-29 MED ORDER — IBUPROFEN 600 MG PO TABS: 600.0000 mg | ORAL_TABLET | Freq: Four times a day (QID) | ORAL | Status: DC | PRN

## 2013-12-29 NOTE — ED Notes (Signed)
Pt states she has a known ovarian cyst on the left side and it is hurting  Pt states she has been hurting for three weeks and was seen here last week for same

## 2013-12-29 NOTE — Progress Notes (Signed)
[facility] [employer]  Provided pt with a list of primary care resources, highlighting [facility].

## 2013-12-29 NOTE — ED Notes (Signed)
Patient was told to undress, patient refused. This tech asked patient to use the restroom to obtain sample, patient stated she forgot. Patient also sent friend down stairs to get food. This tech advised that she cannot have anything at this time. Patient sent friend anyways and asked him to call her with her options for breakfast. Patient states that she knows what's wrong and can't have surgery just wants meds to feel better.

## 2013-12-29 NOTE — ED Notes (Signed)
Pt refused pain meds and further treatment at onset of offer from MD.   MD notified and came to room with RN to explain risks of getting narcotics and receiving no further diagnostic testing. Pt made aware of risks and decided to take pain med given and d/c without further testing.

## 2013-12-29 NOTE — ED Notes (Signed)
Pt has purse and cell phone leaving room with her.

## 2013-12-29 NOTE — ED Provider Notes (Signed)
CSN: [id number]     Arrival date & time 12/29/13  0401 History   First MD Initiated Contact with Patient 12/29/13 [phone]     Chief Complaint  Patient presents with  . Abdominal Pain     (Consider location/radiation/quality/duration/timing/severity/associated sxs/prior Treatment) HPI Comments: Patient here complaining of worsening left lower quadrant pain consistent with her prior ovarian cyst. Symptoms have been there for approximately 3 weeks and are persistent. She does have vaginal bleeding now that she's on her menstrual cycle during her scheduled time. Denies any fever or chills. No urinary symptoms. Pain is persisting characterized as sharp and nonradiating and worse with movement and standing and better with rest. Been using over-the-counter medications without relief.  Patient is a 18 y.o. female presenting with abdominal pain. The history is provided by the patient.  Abdominal Pain   Past Medical History  Diagnosis Date  . Hypoglycemia   . Abnormal Pap smear   . Ovarian cyst   . Gonorrhea   . Chlamydia   . Asthma    Past Surgical History  Procedure Laterality Date  . Appendectomy    . Cystectomy    . Wisdom tooth extraction     Family History  Problem Relation Age of Onset  . Other Neg Hx   . Hypertension Other   . Diabetes Other    History  Substance Use Topics  . Smoking status: Current Some Day Smoker -- 0.50 packs/day    Types: Cigarettes  . Smokeless tobacco: Not on file  . Alcohol Use: Yes     Comment: social   OB History   Grav Para Term Preterm Abortions TAB SAB Ect Mult Living   0              Review of Systems  Gastrointestinal: Positive for abdominal pain.  All other systems reviewed and are negative.     Allergies  Latex  Home Medications   Prior to Admission medications   Not on File   BP 99/65  Pulse 75  Temp(Src) 98.3 F (36.8 C) (Oral)  Resp 16  SpO2 99%  LMP 12/28/2013 Physical Exam  Nursing note and vitals  reviewed. Constitutional: She is oriented to person, place, and time. She appears well-developed and well-nourished.  Non-toxic appearance. No distress.  HENT:  Head: Normocephalic and atraumatic.  Eyes: Conjunctivae, EOM and lids are normal. Pupils are equal, round, and reactive to light.  Neck: Normal range of motion. Neck supple. No tracheal deviation present. No mass present.  Cardiovascular: Normal rate, regular rhythm and normal heart sounds.  Exam reveals no gallop.   No murmur heard. Pulmonary/Chest: Effort normal and breath sounds normal. No stridor. No respiratory distress. She has no decreased breath sounds. She has no wheezes. She has no rhonchi. She has no rales.  Abdominal: Soft. Normal appearance and bowel sounds are normal. She exhibits no distension. There is tenderness in the left lower quadrant. There is no rigidity, no rebound, no guarding and no CVA tenderness.    Musculoskeletal: Normal range of motion. She exhibits no edema and no tenderness.  Neurological: She is alert and oriented to person, place, and time. She has normal strength. No cranial nerve deficit or sensory deficit. GCS eye subscore is 4. GCS verbal subscore is 5. GCS motor subscore is 6.  Skin: Skin is warm and dry. No abrasion and no rash noted.  Psychiatric: She has a normal mood and affect. Her speech is normal and behavior is normal.  ED Course  Procedures (including critical care time) Labs Review Labs Reviewed  URINALYSIS, ROUTINE W REFLEX MICROSCOPIC  POC URINE PREG, ED    Imaging Review No results found.   EKG Interpretation None      MDM   Final diagnoses:  None    Patient does not want any pelvic imaging at this time or pelvic exam. Offered her a pregnancy test which is also refused at this time. She is requesting to be for pain. Informed her that she could be having an ectopic pregnancy although unlikely. She understands this. Also informed her that it could be an ovarian  malignancy that we are not identify and she understands this day. She was to followup at [facility]    Toy Baker, MD 12/29/13 [phone]

## 2013-12-29 NOTE — Discharge Instructions (Signed)
Abdominal Pain, Women  Abdominal (stomach, pelvic, or belly) pain can be caused by many things. It is important to tell your doctor:   The location of the pain.   Does it come and go or is it present all the time?   Are there things that start the pain (eating certain foods, exercise)?   Are there other symptoms associated with the pain (fever, nausea, vomiting, diarrhea)?  All of this is helpful to know when trying to find the cause of the pain.  CAUSES    Stomach: virus or bacteria infection, or ulcer.   Intestine: appendicitis (inflamed appendix), regional ileitis (Crohn's disease), ulcerative colitis (inflamed colon), irritable bowel syndrome, diverticulitis (inflamed diverticulum of the colon), or cancer of the stomach or intestine.   Gallbladder disease or stones in the gallbladder.   Kidney disease, kidney stones, or infection.   Pancreas infection or cancer.   Fibromyalgia (pain disorder).   Diseases of the female organs:   Uterus: fibroid (non-cancerous) tumors or infection.   Fallopian tubes: infection or tubal pregnancy.   Ovary: cysts or tumors.   Pelvic adhesions (scar tissue).   Endometriosis (uterus lining tissue growing in the pelvis and on the pelvic organs).   Pelvic congestion syndrome (female organs filling up with blood just before the menstrual period).   Pain with the menstrual period.   Pain with ovulation (producing an egg).   Pain with an IUD (intrauterine device, birth control) in the uterus.   Cancer of the female organs.   Functional pain (pain not caused by a disease, may improve without treatment).   Psychological pain.   Depression.  DIAGNOSIS   Your doctor will decide the seriousness of your pain by doing an examination.   Blood tests.   X-rays.   Ultrasound.   CT scan (computed tomography, special type of X-ray).   MRI (magnetic resonance imaging).   Cultures, for infection.   Barium enema (dye inserted in the large intestine, to better view it with  X-rays).   Colonoscopy (looking in intestine with a lighted tube).   Laparoscopy (minor surgery, looking in abdomen with a lighted tube).   Major abdominal exploratory surgery (looking in abdomen with a large incision).  TREATMENT   The treatment will depend on the cause of the pain.    Many cases can be observed and treated at home.   Over-the-counter medicines recommended by your caregiver.   Prescription medicine.   Antibiotics, for infection.   Birth control pills, for painful periods or for ovulation pain.   Hormone treatment, for endometriosis.   Nerve blocking injections.   Physical therapy.   Antidepressants.   Counseling with a psychologist or psychiatrist.   Minor or major surgery.  HOME CARE INSTRUCTIONS    Do not take laxatives, unless directed by your caregiver.   Take over-the-counter pain medicine only if ordered by your caregiver. Do not take aspirin because it can cause an upset stomach or bleeding.   Try a clear liquid diet (broth or water) as ordered by your caregiver. Slowly move to a bland diet, as tolerated, if the pain is related to the stomach or intestine.   Have a thermometer and take your temperature several times a day, and record it.   Bed rest and sleep, if it helps the pain.   Avoid sexual intercourse, if it causes pain.   Avoid stressful situations.   Keep your follow-up appointments and tests, as your caregiver orders.   If the pain does   not go away with medicine or surgery, you may try:   Acupuncture.   Relaxation exercises (yoga, meditation).   Group therapy.   Counseling.  SEEK MEDICAL CARE IF:    You notice certain foods cause stomach pain.   Your home care treatment is not helping your pain.   You need stronger pain medicine.   You want your IUD removed.   You feel faint or lightheaded.   You develop nausea and vomiting.   You develop a rash.   You are having side effects or an allergy to your medicine.  SEEK IMMEDIATE MEDICAL CARE IF:    Your  pain does not go away or gets worse.   You have a fever.   Your pain is felt only in portions of the abdomen. The right side could possibly be appendicitis. The left lower portion of the abdomen could be colitis or diverticulitis.   You are passing blood in your stools (bright red or black tarry stools, with or without vomiting).   You have blood in your urine.   You develop chills, with or without a fever.   You pass out.  MAKE SURE YOU:    Understand these instructions.   Will watch your condition.   Will get help right away if you are not doing well or get worse.  Document Released: 01/28/2007 Document Revised: 08/17/2013 Document Reviewed: 02/17/2009  ExitCare Patient Information 2015 [employer]. This information is not intended to replace advice given to you by your health care provider. Make sure you discuss any questions you have with your health care provider.          Ovarian Cyst  An ovarian cyst is a fluid-filled sac that forms on an ovary. The ovaries are small organs that produce eggs in women. Various types of cysts can form on the ovaries. Most are not cancerous. Many do not cause problems, and they often go away on their own. Some may cause symptoms and require treatment. Common types of ovarian cysts include:   Functional cysts--These cysts may occur every month during the menstrual cycle. This is normal. The cysts usually go away with the next menstrual cycle if the woman does not get pregnant. Usually, there are no symptoms with a functional cyst.   Endometrioma cysts--These cysts form from the tissue that lines the uterus. They are also called "chocolate cysts" because they become filled with blood that turns brown. This type of cyst can cause pain in the lower abdomen during intercourse and with your menstrual period.   Cystadenoma cysts--This type develops from the cells on the outside of the ovary. These cysts can get very big and cause lower abdomen pain and pain with  intercourse. This type of cyst can twist on itself, cut off its blood supply, and cause severe pain. It can also easily rupture and cause a lot of pain.   Dermoid cysts--This type of cyst is sometimes found in both ovaries. These cysts may contain different kinds of body tissue, such as skin, teeth, hair, or cartilage. They usually do not cause symptoms unless they get very big.   Theca lutein cysts--These cysts occur when too much of a certain hormone (human chorionic gonadotropin) is produced and overstimulates the ovaries to produce an egg. This is most common after procedures used to assist with the conception of a baby (in vitro fertilization).  CAUSES    Fertility drugs can cause a condition in which multiple large cysts are formed on the   ovaries. This is called ovarian hyperstimulation syndrome.   A condition called polycystic ovary syndrome can cause hormonal imbalances that can lead to nonfunctional ovarian cysts.  SIGNS AND SYMPTOMS   Many ovarian cysts do not cause symptoms. If symptoms are present, they may include:   Pelvic pain or pressure.   Pain in the lower abdomen.   Pain during sexual intercourse.   Increasing girth (swelling) of the abdomen.   Abnormal menstrual periods.   Increasing pain with menstrual periods.   Stopping having menstrual periods without being pregnant.  DIAGNOSIS   These cysts are commonly found during a routine or annual pelvic exam. Tests may be ordered to find out more about the cyst. These tests may include:   Ultrasound.   X-ray of the pelvis.   CT scan.   MRI.   Blood tests.  TREATMENT   Many ovarian cysts go away on their own without treatment. Your health care provider may want to check your cyst regularly for 2-3 months to see if it changes. For women in menopause, it is particularly important to monitor a cyst closely because of the higher rate of ovarian cancer in menopausal women. When treatment is needed, it may include any of the following:   A  procedure to drain the cyst (aspiration). This may be done using a long needle and ultrasound. It can also be done through a laparoscopic procedure. This involves using a thin, lighted tube with a tiny camera on the end (laparoscope) inserted through a small incision.   Surgery to remove the whole cyst. This may be done using laparoscopic surgery or an open surgery involving a larger incision in the lower abdomen.   Hormone treatment or birth control pills. These methods are sometimes used to help dissolve a cyst.  HOME CARE INSTRUCTIONS    Only take over-the-counter or prescription medicines as directed by your health care provider.   Follow up with your health care provider as directed.   Get regular pelvic exams and Pap tests.  SEEK MEDICAL CARE IF:    Your periods are late, irregular, or painful, or they stop.   Your pelvic pain or abdominal pain does not go away.   Your abdomen becomes larger or swollen.   You have pressure on your bladder or trouble emptying your bladder completely.   You have pain during sexual intercourse.   You have feelings of fullness, pressure, or discomfort in your stomach.   You lose weight for no apparent reason.   You feel generally ill.   You become constipated.   You lose your appetite.   You develop acne.   You have an increase in body and facial hair.   You are gaining weight, without changing your exercise and eating habits.   You think you are pregnant.  SEEK IMMEDIATE MEDICAL CARE IF:    You have increasing abdominal pain.   You feel sick to your stomach (nauseous), and you throw up (vomit).   You develop a fever that comes on suddenly.   You have abdominal pain during a bowel movement.   Your menstrual periods become heavier than usual.  MAKE SURE YOU:   Understand these instructions.   Will watch your condition.   Will get help right away if you are not doing well or get worse.  Document Released: 04/02/2005 Document Revised: 04/07/2013 Document  Reviewed: 12/08/2012  Exit[employer] Patient Information 2015 [employer]. This information is not intended to replace advice given to you by   your health care provider. Make sure you discuss any questions you have with your health care provider.

## 2013-12-31 ENCOUNTER — Telehealth (HOSPITAL_COMMUNITY): Payer: Self-pay

## 2013-12-31 NOTE — ED Notes (Signed)
Unable to reach by telephone. Letter sent to address on record.  

## 2014-01-16 ENCOUNTER — Telehealth (HOSPITAL_COMMUNITY): Payer: Self-pay

## 2014-01-16 NOTE — ED Notes (Signed)
Unable to contact pt by mail or telephone. Unable to communicate lab results or treatment changes. 

## 2014-02-17 ENCOUNTER — Inpatient Hospital Stay (HOSPITAL_COMMUNITY)
Admission: AD | Admit: 2014-02-17 | Discharge: 2014-02-17 | Disposition: A | Discharge status: Home / Self Care | Payer: Medicaid Other | Source: Ambulatory Visit | Attending: Obstetrics & Gynecology | Admitting: Obstetrics & Gynecology

## 2014-02-17 ENCOUNTER — Encounter (HOSPITAL_COMMUNITY): Payer: Self-pay | Admitting: *Deleted

## 2014-02-17 DIAGNOSIS — Z3202 Encounter for pregnancy test, result negative: Secondary | ICD-10-CM | POA: Insufficient documentation

## 2014-02-17 DIAGNOSIS — R110 Nausea: Secondary | ICD-10-CM | POA: Insufficient documentation

## 2014-02-17 DIAGNOSIS — F17210 Nicotine dependence, cigarettes, uncomplicated: Secondary | ICD-10-CM | POA: Insufficient documentation

## 2014-02-17 LAB — URINE MICROSCOPIC-ADD ON

## 2014-02-17 LAB — URINALYSIS, ROUTINE W REFLEX MICROSCOPIC
BILIRUBIN URINE: NEGATIVE
GLUCOSE, UA: NEGATIVE mg/dL
HGB URINE DIPSTICK: NEGATIVE
KETONES UR: NEGATIVE mg/dL
Nitrite: NEGATIVE
PH: 6.5 (ref 5.0–8.0)
Protein, ur: NEGATIVE mg/dL
Specific Gravity, Urine: 1.02 (ref 1.005–1.030)
Urobilinogen, UA: 0.2 mg/dL (ref 0.0–1.0)

## 2014-02-17 LAB — GLUCOSE, CAPILLARY: Glucose-Capillary: 102 mg/dL — ABNORMAL HIGH (ref 70–99)

## 2014-02-17 LAB — POCT PREGNANCY, URINE: Preg Test, Ur: NEGATIVE

## 2014-02-17 NOTE — Discharge Instructions (Signed)

## 2014-02-17 NOTE — MAU Note (Signed)
C/o nausea for past week;c/o abdominal cramping for about a week; c/o breast pain for week;

## 2014-02-17 NOTE — MAU Provider Note (Signed)
History     CSN: [id number]  Arrival date and time: 02/17/14 [id number]   First Provider Initiated Contact with Patient 02/17/14 0932      Chief Complaint  Patient presents with  . Breast Pain  . Nausea  . Abdominal Pain   HPI   [name] is a 18 y.o. female G0P0 at Unknown gestation who presents with pregnancy symptoms; she has bilateral breast pain, nausea and abdominal pain. Last menstrual cycle was 01/28/2014. She is here for a pregnancy test and wants to know what she can take for nausea. She declines STI testing.   OB History    Gravida Para Term Preterm AB TAB SAB Ectopic Multiple Living   0               Past Medical History  Diagnosis Date  . Hypoglycemia   . Abnormal Pap smear   . Ovarian cyst   . Gonorrhea   . Chlamydia   . Asthma     Past Surgical History  Procedure Laterality Date  . Appendectomy    . Cystectomy    . Wisdom tooth extraction      Family History  Problem Relation Age of Onset  . Other Neg Hx   . Hypertension Other   . Diabetes Other     History  Substance Use Topics  . Smoking status: Current Some Day Smoker -- 0.50 packs/day    Types: Cigarettes  . Smokeless tobacco: Not on file  . Alcohol Use: Yes     Comment: social    Allergies:  Allergies  Allergen Reactions  . Latex Itching and Rash    Prescriptions prior to admission  Medication Sig Dispense Refill Last Dose  . ibuprofen (ADVIL,MOTRIN) 600 MG tablet Take 1 tablet (600 mg total) by mouth every 6 (six) hours as needed. 30 tablet 0    Results for orders placed or performed during the hospital encounter of 02/17/14 (from the past 48 hour(s))  Urinalysis, Routine w reflex microscopic     Status: Abnormal   Collection Time: 02/17/14  9:02 AM  Result Value Ref Range   Color, Urine YELLOW YELLOW   APPearance HAZY (A) CLEAR   Specific Gravity, Urine 1.020 1.005 - 1.030   pH 6.5 5.0 - 8.0   Glucose, UA NEGATIVE NEGATIVE mg/dL   Hgb urine dipstick NEGATIVE  NEGATIVE   Bilirubin Urine NEGATIVE NEGATIVE   Ketones, ur NEGATIVE NEGATIVE mg/dL   Protein, ur NEGATIVE NEGATIVE mg/dL   Urobilinogen, UA 0.2 0.0 - 1.0 mg/dL   Nitrite NEGATIVE NEGATIVE   Leukocytes, UA TRACE (A) NEGATIVE  Urine microscopic-add on     Status: Abnormal   Collection Time: 02/17/14  9:02 AM  Result Value Ref Range   Squamous Epithelial / LPF MANY (A) RARE   WBC, UA 3-6 <3 WBC/hpf   Bacteria, UA FEW (A) RARE  Pregnancy, urine POC     Status: None   Collection Time: 02/17/14  9:12 AM  Result Value Ref Range   Preg Test, Ur NEGATIVE NEGATIVE    Comment:        THE SENSITIVITY OF THIS METHODOLOGY IS >24 mIU/mL   Glucose, capillary     Status: Abnormal   Collection Time: 02/17/14  9:58 AM  Result Value Ref Range   Glucose-Capillary 102 (H) 70 - 99 mg/dL    Review of Systems  Constitutional: Negative for fever and chills.  Gastrointestinal: Positive for nausea.  Genitourinary: Negative for dysuria, urgency  and frequency.  Skin:       + Bilateral breast pain    Physical Exam   Blood pressure 116/68, pulse 83, temperature 98.2 F (36.8 C), temperature source Oral, resp. rate 18, height 5' (1.524 m), weight 61.689 kg (136 lb).  Physical Exam  Constitutional: She is oriented to person, place, and time. She appears well-nourished. No distress.  HENT:  Head: Normocephalic.  Eyes: Pupils are equal, round, and reactive to light.  Neck: Neck supple.  Respiratory: Effort normal.  Musculoskeletal: Normal range of motion.  Neurological: She is alert and oriented to person, place, and time.  Skin: She is not diaphoretic.  Psychiatric: Her behavior is normal.    MAU Course  Procedures  None  MDM UA Urine pregnancy test was negative.  She is due to start her period the middle of the month and she is encouraged to take an at home pregnancy test if her menstrual cycle does not start. Patient declined STI testing> pap done 2 months ago> no new partners. Patient  would like HIV testing.  CBG: patient requests to have her blood sugar checked.   Assessment and Plan   A: 1. Nausea   2. Pregnancy test negative    P: Discharge home in stable condition Ok to take over the counter Meclezine for nausea; as directed on the bottle Return to MAU for emergencies only Condoms always    Iona HansenJennifer Irene Rasch, NP 02/17/2014 1:07 PM

## 2014-08-22 ENCOUNTER — Inpatient Hospital Stay (HOSPITAL_COMMUNITY)
Admission: AD | Admit: 2014-08-22 | Discharge: 2014-08-22 | Disposition: A | Discharge status: Home / Self Care | Payer: Medicaid Other | Source: Ambulatory Visit | Attending: Obstetrics & Gynecology | Admitting: Obstetrics & Gynecology

## 2014-08-22 ENCOUNTER — Encounter (HOSPITAL_COMMUNITY): Payer: Self-pay

## 2014-08-22 DIAGNOSIS — Z3202 Encounter for pregnancy test, result negative: Secondary | ICD-10-CM | POA: Insufficient documentation

## 2014-08-22 DIAGNOSIS — F17210 Nicotine dependence, cigarettes, uncomplicated: Secondary | ICD-10-CM | POA: Insufficient documentation

## 2014-08-22 DIAGNOSIS — R112 Nausea with vomiting, unspecified: Secondary | ICD-10-CM

## 2014-08-22 LAB — URINALYSIS, ROUTINE W REFLEX MICROSCOPIC
Bilirubin Urine: NEGATIVE
Glucose, UA: NEGATIVE mg/dL
Hgb urine dipstick: NEGATIVE
Ketones, ur: NEGATIVE mg/dL
LEUKOCYTES UA: NEGATIVE
NITRITE: NEGATIVE
PROTEIN: NEGATIVE mg/dL
SPECIFIC GRAVITY, URINE: 1.02 (ref 1.005–1.030)
Urobilinogen, UA: 0.2 mg/dL (ref 0.0–1.0)
pH: 7 (ref 5.0–8.0)

## 2014-08-22 LAB — POCT PREGNANCY, URINE: Preg Test, Ur: NEGATIVE

## 2014-08-22 MED ORDER — ONDANSETRON 4 MG PO TBDP: 4.0000 mg | ORAL_TABLET | Freq: Four times a day (QID) | ORAL | Status: DC | PRN

## 2014-08-22 MED ORDER — PROMETHAZINE HCL 25 MG PO TABS: 25.0000 mg | ORAL_TABLET | Freq: Four times a day (QID) | ORAL | Status: DC | PRN

## 2014-08-22 MED ORDER — ONDANSETRON 8 MG PO TBDP
8.0000 mg | ORAL_TABLET | Freq: Once | ORAL | Status: AC
  Administered 2014-08-22: 8 mg via ORAL
  Filled 2014-08-22: qty 1

## 2014-08-22 NOTE — Discharge Instructions (Signed)
Nausea and Vomiting Nausea means you feel sick to your stomach. Throwing up (vomiting) is a reflex where stomach contents come out of your mouth. HOME CARE   Take medicine as told by your doctor.  Do not force yourself to eat. However, you do need to drink fluids.  If you feel like eating, eat a normal diet as told by your doctor.  Eat rice, wheat, potatoes, bread, lean meats, yogurt, fruits, and vegetables.  Avoid high-fat foods.  Drink enough fluids to keep your pee (urine) clear or pale yellow.  Ask your doctor how to replace body fluid losses (rehydrate). Signs of body fluid loss (dehydration) include:  Feeling very thirsty.  Dry lips and mouth.  Feeling dizzy.  Dark pee.  Peeing less than normal.  Feeling confused.  Fast breathing or heart rate. GET HELP RIGHT AWAY IF:   You have blood in your throw up.  You have black or bloody poop (stool).  You have a bad headache or stiff neck.  You feel confused.  You have bad belly (abdominal) pain.  You have chest pain or trouble breathing.  You do not pee at least once every 8 hours.  You have cold, clammy skin.  You keep throwing up after 24 to 48 hours.  You have a fever. MAKE SURE YOU:   Understand these instructions.  Will watch your condition.  Will get help right away if you are not doing well or get worse. Document Released: 09/19/2007 Document Revised: 06/25/2011 Document Reviewed: 09/01/2010 [facility] Patient Information 2015 [place], [place]. This information is not intended to replace advice given to you by your health care provider. Make sure you discuss any questions you have with your health care provider.  Viral Gastroenteritis Viral gastroenteritis is also known as stomach flu. This condition affects the stomach and intestinal tract. It can cause sudden diarrhea and vomiting. The illness typically lasts 3 to 8 days. Most people develop an immune response that eventually gets rid of the virus.  While this natural response develops, the virus can make you quite ill. CAUSES  Many different viruses can cause gastroenteritis, such as rotavirus or noroviruses. You can catch one of these viruses by consuming contaminated food or water. You may also catch a virus by sharing utensils or other personal items with an infected person or by touching a contaminated surface. SYMPTOMS  The most common symptoms are diarrhea and vomiting. These problems can cause a severe loss of body fluids (dehydration) and a body salt (electrolyte) imbalance. Other symptoms may include:  Fever.  Headache.  Fatigue.  Abdominal pain. DIAGNOSIS  Your caregiver can usually diagnose viral gastroenteritis based on your symptoms and a physical exam. A stool sample may also be taken to test for the presence of viruses or other infections. TREATMENT  This illness typically goes away on its own. Treatments are aimed at rehydration. The most serious cases of viral gastroenteritis involve vomiting so severely that you are not able to keep fluids down. In these cases, fluids must be given through an intravenous line (IV). HOME CARE INSTRUCTIONS   Drink enough fluids to keep your urine clear or pale yellow. Drink small amounts of fluids frequently and increase the amounts as tolerated.  Ask your caregiver for specific rehydration instructions.  Avoid:  Foods high in sugar.  Alcohol.  Carbonated drinks.  Tobacco.  Juice.  Caffeine drinks.  Extremely hot or cold fluids.  Fatty, greasy foods.  Too much intake of anything at one time.  Dairy products until 24 to 48 hours after diarrhea stops.  You may consume probiotics. Probiotics are active cultures of beneficial bacteria. They may lessen the amount and number of diarrheal stools in adults. Probiotics can be found in yogurt with active cultures and in supplements.  Wash your hands well to avoid spreading the virus.  Only take over-the-counter or  prescription medicines for pain, discomfort, or fever as directed by your caregiver. Do not give aspirin to children. Antidiarrheal medicines are not recommended.  Ask your caregiver if you should continue to take your regular prescribed and over-the-counter medicines.  Keep all follow-up appointments as directed by your caregiver. SEEK IMMEDIATE MEDICAL CARE IF:   You are unable to keep fluids down.  You do not urinate at least once every 6 to 8 hours.  You develop shortness of breath.  You notice blood in your stool or vomit. This may look like coffee grounds.  You have abdominal pain that increases or is concentrated in one small area (localized).  You have persistent vomiting or diarrhea.  You have a fever.  The patient is a child younger than 3 months, and he or she has a fever.  The patient is a child older than 3 months, and he or she has a fever and persistent symptoms.  The patient is a child older than 3 months, and he or she has a fever and symptoms suddenly get worse.  The patient is a baby, and he or she has no tears when crying. MAKE SURE YOU:   Understand these instructions.  Will watch your condition.  Will get help right away if you are not doing well or get worse. Document Released: 04/02/2005 Document Revised: 06/25/2011 Document Reviewed: 01/17/2011 [facility] Patient Information 2015 [place], [place]. This information is not intended to replace advice given to you by your health care provider. Make sure you discuss any questions you have with your health care provider.

## 2014-08-22 NOTE — MAU Note (Signed)
Pt states LMP-08/04/2014, however thinks she is pregnant r/t nausea and intermittent vomiting. States she always vomits chicken even hours after consuming it. Feels constant nausea. Denies pain. Has thick white vaginal discharge, non-odorous. Noted x1 week.

## 2014-08-22 NOTE — MAU Provider Note (Signed)
History     CSN: [id number]  Arrival date and time: 08/22/14 1003   First Provider Initiated Contact with Patient 08/22/14 1037      Chief Complaint  Patient presents with  . Nausea   Emesis  This is a new problem. The current episode started in the past 7 days. The problem occurs less than 2 times per day. The problem has been unchanged. There has been no fever. Pertinent negatives include no abdominal pain, arthralgias, chills, diarrhea, dizziness, fever, headaches or myalgias. She has tried nothing for the symptoms.   This is a 19 y.o. female who presents with c/o nausea and vomiting for a week. States has not vomited much but is nauseated "all the time". States has boiled ginger at home and it did not help. Denies any abdominal pain, states it "just feels uncomfortable".  Denies any gynecological complaints when I ask specifically. Period is not late. Has never missed periods.   RN note: Pt states LMP-08/04/2014, however thinks she is pregnant r/t nausea and intermittent vomiting. States she always vomits chicken even hours after consuming it. Feels constant nausea. Denies pain. Has thick white vaginal discharge, non-odorous. Noted x1 week.           OB History    Gravida Para Term Preterm AB TAB SAB Ectopic Multiple Living   0               Past Medical History  Diagnosis Date  . Hypoglycemia   . Abnormal Pap smear   . Ovarian cyst   . Gonorrhea   . Chlamydia   . Asthma     Past Surgical History  Procedure Laterality Date  . Appendectomy    . Cystectomy    . Wisdom tooth extraction      Family History  Problem Relation Age of Onset  . Other Neg Hx   . Hypertension Other   . Diabetes Other     History  Substance Use Topics  . Smoking status: Current Some Day Smoker -- 0.50 packs/day    Types: Cigarettes  . Smokeless tobacco: Never Used  . Alcohol Use: Yes     Comment: social    Allergies:  Allergies  Allergen Reactions  . Latex Itching and  Rash    Prescriptions prior to admission  Medication Sig Dispense Refill Last Dose  . Aspirin-Caffeine (BC FAST PAIN RELIEF PO) Take 1 packet by mouth daily.   08/21/2014 at Unknown time  . ibuprofen (ADVIL,MOTRIN) 600 MG tablet Take 1 tablet (600 mg total) by mouth every 6 (six) hours as needed. (Patient not taking: Reported on 08/22/2014) 30 tablet 0     Review of Systems  Constitutional: Negative for fever and chills.  Eyes: Negative for blurred vision.  Gastrointestinal: Positive for vomiting. Negative for abdominal pain and diarrhea.  Genitourinary: Negative for dysuria.  Musculoskeletal: Negative for myalgias and arthralgias.  Neurological: Negative for dizziness, focal weakness and headaches.   Physical Exam   Blood pressure 114/70, pulse 76, temperature 97.7 F (36.5 C), temperature source Oral, resp. rate 18, height 5' (1.524 m), weight 146 lb (66.225 kg), last menstrual period 08/04/2014, SpO2 99 %.  Physical Exam  Constitutional: She is oriented to person, place, and time. She appears well-developed and well-nourished. No distress.  HENT:  Head: Normocephalic.  Cardiovascular: Normal rate, regular rhythm and normal heart sounds.  Exam reveals no gallop and no friction rub.   No murmur heard. Respiratory: Effort normal and breath sounds normal. No  respiratory distress. She has no wheezes. She has no rales.  GI: Soft. She exhibits no distension and no mass. There is no tenderness. There is no rebound and no guarding.  Musculoskeletal: Normal range of motion.  Neurological: She is alert and oriented to person, place, and time.  Skin: Skin is warm and dry.  Psychiatric: She has a normal mood and affect.    MAU Course  Procedures  MDM Results for orders placed or performed during the hospital encounter of 08/22/14 (from the past 24 hour(s))  Urinalysis, Routine w reflex microscopic     Status: None   Collection Time: 08/22/14 10:12 AM  Result Value Ref Range   Color,  Urine YELLOW YELLOW   APPearance CLEAR CLEAR   Specific Gravity, Urine 1.020 1.005 - 1.030   pH 7.0 5.0 - 8.0   Glucose, UA NEGATIVE NEGATIVE mg/dL   Hgb urine dipstick NEGATIVE NEGATIVE   Bilirubin Urine NEGATIVE NEGATIVE   Ketones, ur NEGATIVE NEGATIVE mg/dL   Protein, ur NEGATIVE NEGATIVE mg/dL   Urobilinogen, UA 0.2 0.0 - 1.0 mg/dL   Nitrite NEGATIVE NEGATIVE   Leukocytes, UA NEGATIVE NEGATIVE  Pregnancy, urine POC     Status: None   Collection Time: 08/22/14 10:29 AM  Result Value Ref Range   Preg Test, Ur NEGATIVE NEGATIVE   Will give a dose of Zofran and try PO fluids.  Felt better after meds, wants to go home Wants work note   Assessment and Plan  A:  Nausea and vomiting      Probable gastroenteritis, but may need workup for other causes      Negative pregnancy test  P:  Discharge home       Rx Zofran and Phenergan, warned of sleepiness with Phenergan       Work note for today given       Encouraged to seek Primary doctor for ongoing care  [facility] JuanWILLIAMS,MARIE 08/22/2014, 11:06 AM

## 2014-08-23 ENCOUNTER — Emergency Department (HOSPITAL_COMMUNITY)
Admission: EM | Admit: 2014-08-23 | Discharge: 2014-08-24 | Disposition: A | Discharge status: Home / Self Care | Payer: Medicaid Other | Attending: Emergency Medicine | Admitting: Emergency Medicine

## 2014-08-23 ENCOUNTER — Encounter (HOSPITAL_COMMUNITY): Payer: Self-pay | Admitting: *Deleted

## 2014-08-23 DIAGNOSIS — Z9049 Acquired absence of other specified parts of digestive tract: Secondary | ICD-10-CM | POA: Insufficient documentation

## 2014-08-23 DIAGNOSIS — R109 Unspecified abdominal pain: Secondary | ICD-10-CM | POA: Insufficient documentation

## 2014-08-23 DIAGNOSIS — Z91040 Latex allergy status: Secondary | ICD-10-CM | POA: Insufficient documentation

## 2014-08-23 DIAGNOSIS — Z3202 Encounter for pregnancy test, result negative: Secondary | ICD-10-CM | POA: Insufficient documentation

## 2014-08-23 DIAGNOSIS — Z8742 Personal history of other diseases of the female genital tract: Secondary | ICD-10-CM | POA: Insufficient documentation

## 2014-08-23 DIAGNOSIS — R112 Nausea with vomiting, unspecified: Secondary | ICD-10-CM | POA: Insufficient documentation

## 2014-08-23 DIAGNOSIS — Z720 Tobacco use: Secondary | ICD-10-CM | POA: Insufficient documentation

## 2014-08-23 DIAGNOSIS — Z8619 Personal history of other infectious and parasitic diseases: Secondary | ICD-10-CM | POA: Insufficient documentation

## 2014-08-23 DIAGNOSIS — J45909 Unspecified asthma, uncomplicated: Secondary | ICD-10-CM | POA: Insufficient documentation

## 2014-08-23 DIAGNOSIS — N898 Other specified noninflammatory disorders of vagina: Secondary | ICD-10-CM | POA: Insufficient documentation

## 2014-08-23 IMAGING — US US PELVIS COMPLETE
1 series · 13 of 25 positions shown · non-contrast
Comparison: Abdominal pelvic CT of 02/02/2012

CLINICAL DATA: Left lower quadrant pain.  Rule out ovarian/adnexal
torsion.

TRANSABDOMINAL AND TRANSVAGINAL ULTRASOUND OF PELVIS
DOPPLER ULTRASOUND OF OVARIES
TECHNIQUE: Both transabdominal and transvaginal ultrasound
examinations of the pelvis were performed. Transabdominal technique
was performed for global imaging of the pelvis including uterus,
ovaries, adnexal regions, and pelvic cul-de-sac.
It was necessary to proceed with endovaginal exam following the
transabdominal exam to visualize the uterus, ovaries, and adnexa  .
Color and duplex Doppler ultrasound was utilized to evaluate blood
flow to the ovaries.

[Series 1: us pelvis complete · 0.24mm/px · 13 of 63 slices shown]
[im 1/63]
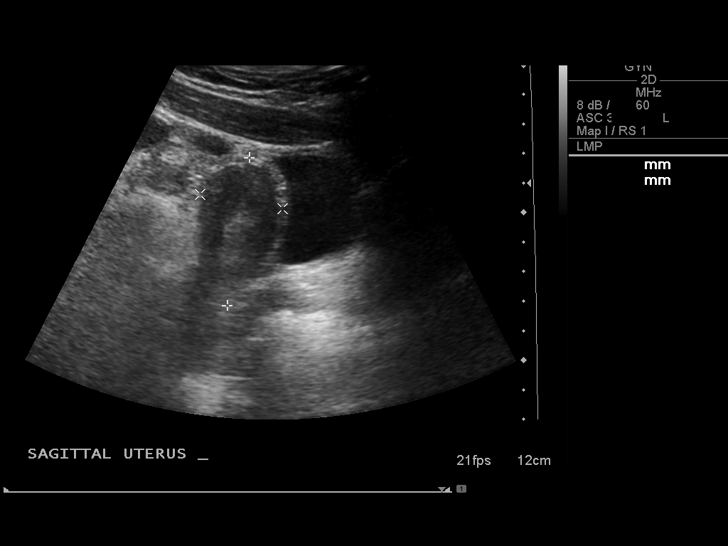
[im 6/63]
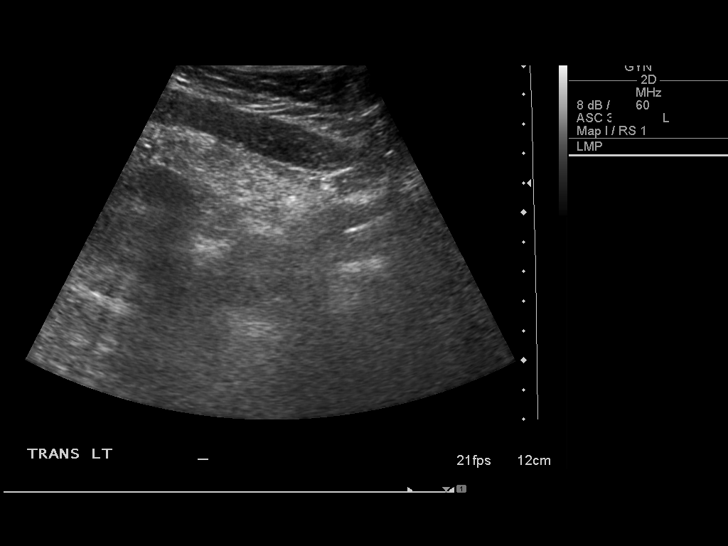
[im 11/63]
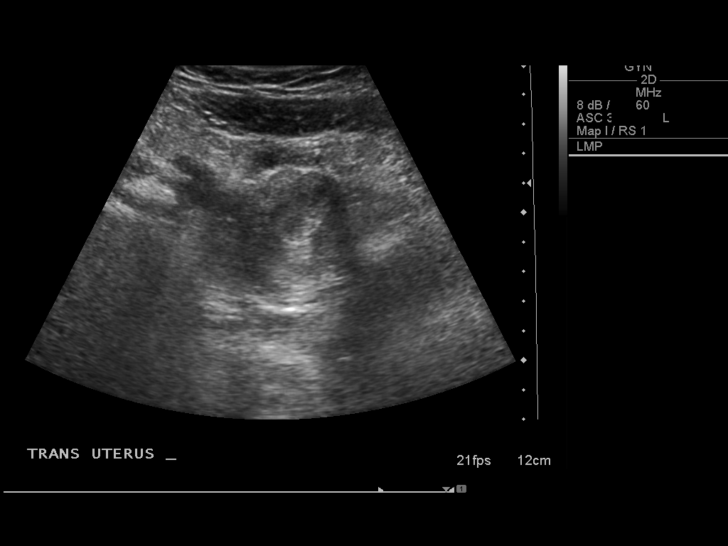
[im 16/63]
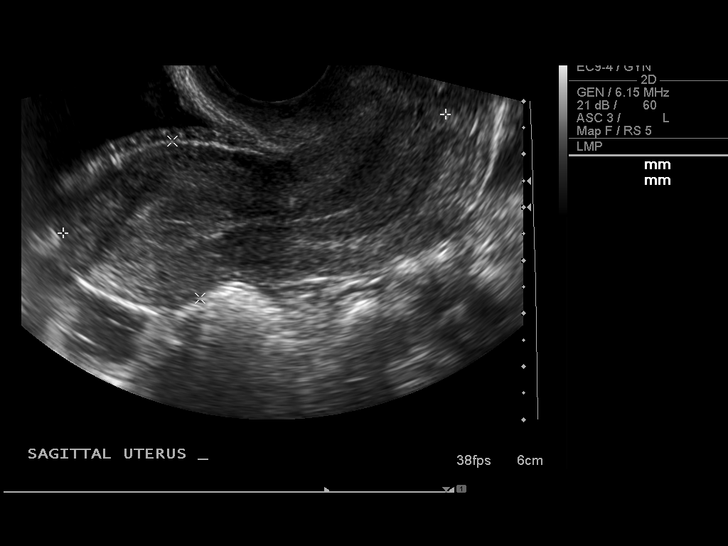
[im 21/63]
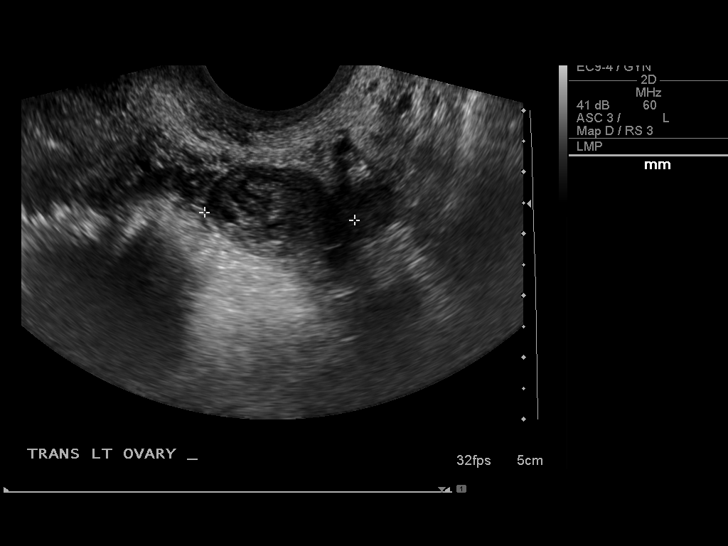
[im 26/63]
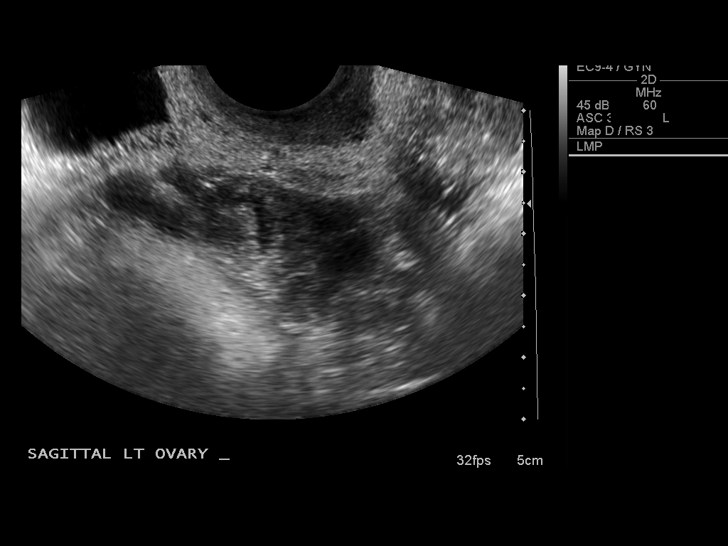
[im 32/63]
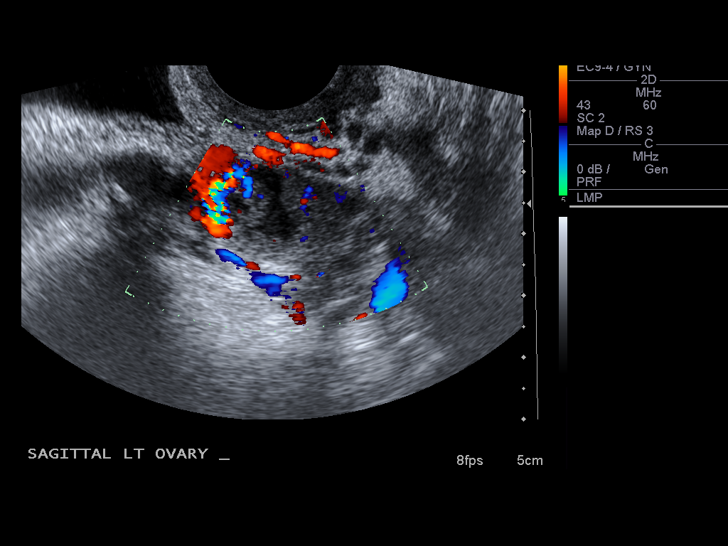
[im 37/63]
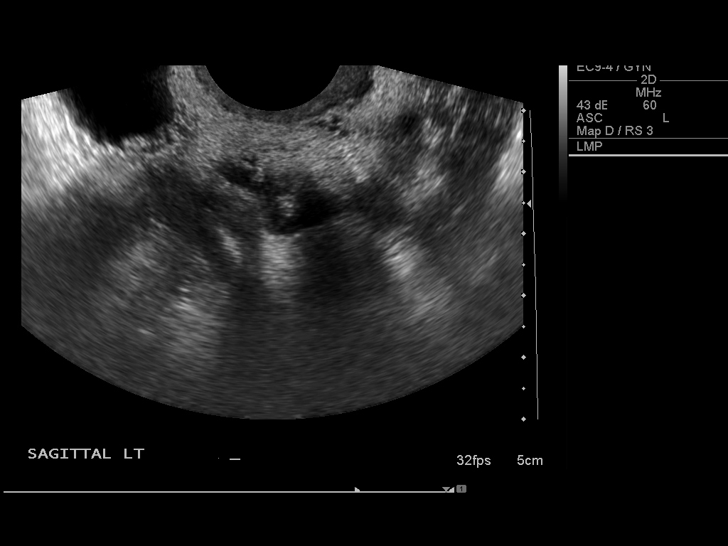
[im 42/63]
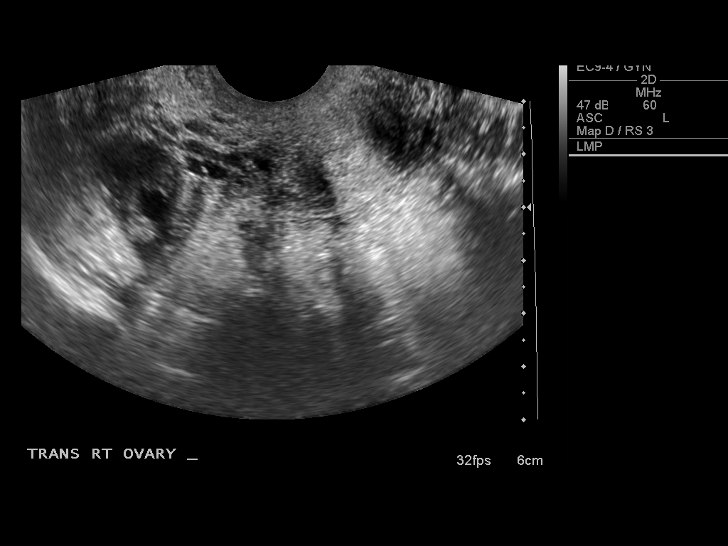
[im 47/63]
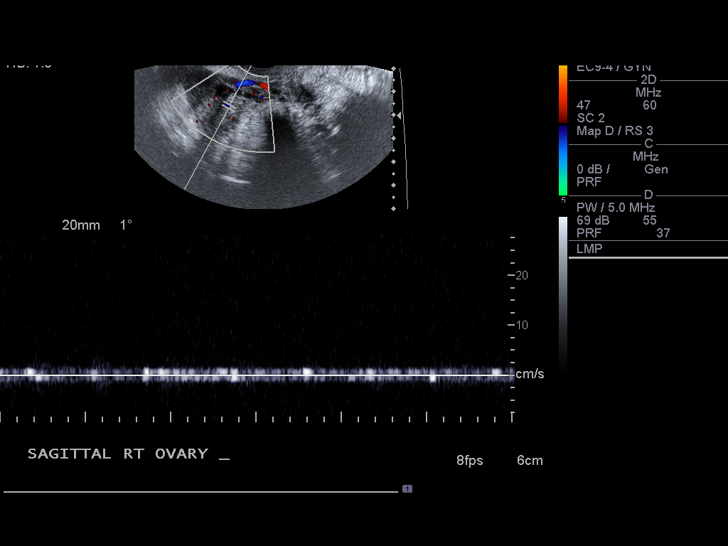
[im 52/63]
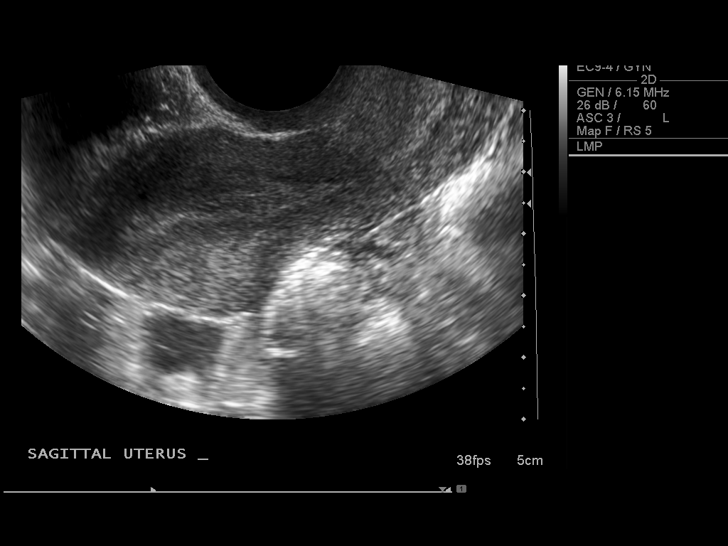
[im 57/63]
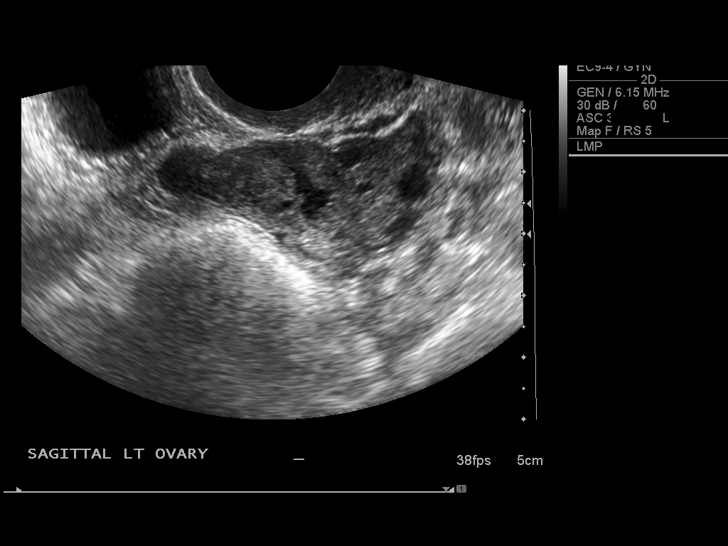
[im 63/63]
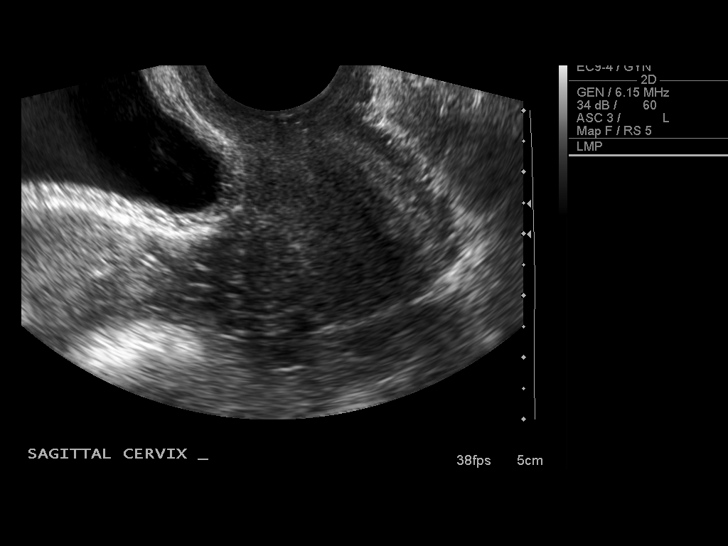

[13 of 25 positions shown; findings below may reference images not displayed]

FINDINGS: Uterus:  7.5 x 3.0 x 4.6 cm. Normal in morphology.

Endometrium:  Normal, 11 mm.

Right ovary: 2.7 x 1.7 x 1.9 cm. Normal in morphology.

Left ovary:  3.7 x 2.2 x 2.8 cm.}An irregular 1.4 cm lesion within
demonstrates mild hypervascularity.

Trace free pelvic fluid is likely physiologic.

Pulsed Doppler evaluation demonstrates normal low-resistance
arterial and venous waveforms in both ovaries.
IMPRESSION: 1.4 cm left ovarian lesion is likely an involuting corpus luteal
cyst.

No sonographic evidence for ovarian torsion.

## 2014-08-23 MED ORDER — ONDANSETRON 8 MG PO TBDP
8.0000 mg | ORAL_TABLET | Freq: Once | ORAL | Status: DC
  Filled 2014-08-23: qty 1

## 2014-08-23 NOTE — ED Notes (Signed)
Pt states that she has had nausea and vomiting x 1 week; pt states that she feel hot and flushed at times; pt c/o abd cramps at times; pt denies diarrhea; pt states that she has vomited x 2 today; pt c/o dizziness off and on

## 2014-08-24 LAB — CBC WITH DIFFERENTIAL/PLATELET
Basophils Absolute: 0 10*3/uL (ref 0.0–0.1)
Basophils Relative: 0 % (ref 0–1)
EOS PCT: 2 % (ref 0–5)
Eosinophils Absolute: 0.2 10*3/uL (ref 0.0–0.7)
HEMATOCRIT: 43 % (ref 36.0–46.0)
Hemoglobin: 14.4 g/dL (ref 12.0–15.0)
Lymphocytes Relative: 26 % (ref 12–46)
Lymphs Abs: 3.4 10*3/uL (ref 0.7–4.0)
MCH: 30.6 pg (ref 26.0–34.0)
MCHC: 33.5 g/dL (ref 30.0–36.0)
MCV: 91.3 fL (ref 78.0–100.0)
Monocytes Absolute: 1.1 10*3/uL — ABNORMAL HIGH (ref 0.1–1.0)
Monocytes Relative: 8 % (ref 3–12)
Neutro Abs: 8.6 10*3/uL — ABNORMAL HIGH (ref 1.7–7.7)
Neutrophils Relative %: 64 % (ref 43–77)
PLATELETS: 228 10*3/uL (ref 150–400)
RBC: 4.71 MIL/uL (ref 3.87–5.11)
RDW: 13.5 % (ref 11.5–15.5)
WBC: 13.3 10*3/uL — ABNORMAL HIGH (ref 4.0–10.5)

## 2014-08-24 LAB — COMPREHENSIVE METABOLIC PANEL
ALT: 16 U/L (ref 14–54)
AST: 18 U/L (ref 15–41)
Albumin: 4 g/dL (ref 3.5–5.0)
Alkaline Phosphatase: 46 U/L (ref 38–126)
Anion gap: 3 — ABNORMAL LOW (ref 5–15)
BUN: 9 mg/dL (ref 6–20)
CHLORIDE: 107 mmol/L (ref 101–111)
CO2: 26 mmol/L (ref 22–32)
Calcium: 8.6 mg/dL — ABNORMAL LOW (ref 8.9–10.3)
Creatinine, Ser: 0.81 mg/dL (ref 0.44–1.00)
GFR calc Af Amer: >60 mL/min (ref >60)
GLUCOSE: 91 mg/dL (ref 70–99)
POTASSIUM: 3.6 mmol/L (ref 3.5–5.1)
Sodium: 136 mmol/L (ref 135–145)
Total Bilirubin: 0.7 mg/dL (ref 0.3–1.2)
Total Protein: 6.9 g/dL (ref 6.5–8.1)

## 2014-08-24 LAB — URINALYSIS, ROUTINE W REFLEX MICROSCOPIC
BILIRUBIN URINE: NEGATIVE
Glucose, UA: NEGATIVE mg/dL
Hgb urine dipstick: NEGATIVE
Ketones, ur: NEGATIVE mg/dL
Leukocytes, UA: NEGATIVE
NITRITE: NEGATIVE
PH: 5.5 (ref 5.0–8.0)
Protein, ur: NEGATIVE mg/dL
Specific Gravity, Urine: 1.026 (ref 1.005–1.030)
Urobilinogen, UA: 0.2 mg/dL (ref 0.0–1.0)

## 2014-08-24 LAB — WET PREP, GENITAL
Trich, Wet Prep: NONE SEEN
Yeast Wet Prep HPF POC: NONE SEEN

## 2014-08-24 LAB — GC/CHLAMYDIA PROBE AMP (~~LOC~~) NOT AT ARMC
CHLAMYDIA, DNA PROBE: NEGATIVE
Neisseria Gonorrhea: NEGATIVE

## 2014-08-24 LAB — LIPASE, BLOOD: LIPASE: 23 U/L (ref 22–51)

## 2014-08-24 LAB — POC URINE PREG, ED: PREG TEST UR: NEGATIVE

## 2014-08-24 MED ORDER — METOCLOPRAMIDE HCL 10 MG PO TABS: 10.0000 mg | ORAL_TABLET | Freq: Four times a day (QID) | ORAL | Status: DC | PRN

## 2014-08-24 MED ORDER — FAMOTIDINE 20 MG PO TABS: 20.0000 mg | ORAL_TABLET | Freq: Two times a day (BID) | ORAL | Status: DC

## 2014-08-24 MED ORDER — METOCLOPRAMIDE HCL 10 MG PO TABS
10.0000 mg | ORAL_TABLET | Freq: Once | ORAL | Status: AC
  Administered 2014-08-24: 10 mg via ORAL
  Filled 2014-08-24: qty 1

## 2014-08-24 MED ORDER — FAMOTIDINE 20 MG PO TABS
40.0000 mg | ORAL_TABLET | Freq: Once | ORAL | Status: AC
  Administered 2014-08-24: 40 mg via ORAL
  Filled 2014-08-24: qty 2

## 2014-08-24 NOTE — ED Notes (Signed)
Pt given crackers and gingerale.

## 2014-08-24 NOTE — Discharge Instructions (Signed)
Your workup today has not show any lab abnormality.  Your abdominal exam showed no signs of an acute surgical problem.  You will need further workup with a primary care doctor and/or gastroenterologist.  Please use the list below to establish care with a local primary care doctor.  Use the medications prescribed today to help with symptoms.  Eat small frequent meals, avoiding foods that seem to trigger nausea and/or vomiting.  Drink plenty of fluids   Nausea and Vomiting Nausea is a sick feeling that often comes before throwing up (vomiting). Vomiting is a reflex where stomach contents come out of your mouth. Vomiting can cause severe loss of body fluids (dehydration). Children and elderly adults can become dehydrated quickly, especially if they also have diarrhea. Nausea and vomiting are symptoms of a condition or disease. It is important to find the cause of your symptoms. CAUSES   Direct irritation of the stomach lining. This irritation can result from increased acid production (gastroesophageal reflux disease), infection, food poisoning, taking certain medicines (such as nonsteroidal anti-inflammatory drugs), alcohol use, or tobacco use.  Signals from the brain.These signals could be caused by a headache, heat exposure, an inner ear disturbance, increased pressure in the brain from injury, infection, a tumor, or a concussion, pain, emotional stimulus, or metabolic problems.  An obstruction in the gastrointestinal tract (bowel obstruction).  Illnesses such as diabetes, hepatitis, gallbladder problems, appendicitis, kidney problems, cancer, sepsis, atypical symptoms of a heart attack, or eating disorders.  Medical treatments such as chemotherapy and radiation.  Receiving medicine that makes you sleep (general anesthetic) during surgery. DIAGNOSIS Your caregiver may ask for tests to be done if the problems do not improve after a few days. Tests may also be done if symptoms are severe or if the  reason for the nausea and vomiting is not clear. Tests may include:  Urine tests.  Blood tests.  Stool tests.  Cultures (to look for evidence of infection).  X-rays or other imaging studies. Test results can help your caregiver make decisions about treatment or the need for additional tests. TREATMENT You need to stay well hydrated. Drink frequently but in small amounts.You may wish to drink water, sports drinks, clear broth, or eat frozen ice pops or gelatin dessert to help stay hydrated.When you eat, eating slowly may help prevent nausea.There are also some antinausea medicines that may help prevent nausea. HOME CARE INSTRUCTIONS   Take all medicine as directed by your caregiver.  If you do not have an appetite, do not force yourself to eat. However, you must continue to drink fluids.  If you have an appetite, eat a normal diet unless your caregiver tells you differently.  Eat a variety of complex carbohydrates (rice, wheat, potatoes, bread), lean meats, yogurt, fruits, and vegetables.  Avoid high-fat foods because they are more difficult to digest.  Drink enough water and fluids to keep your urine clear or pale yellow.  If you are dehydrated, ask your caregiver for specific rehydration instructions. Signs of dehydration may include:  Severe thirst.  Dry lips and mouth.  Dizziness.  Dark urine.  Decreasing urine frequency and amount.  Confusion.  Rapid breathing or pulse. SEEK IMMEDIATE MEDICAL CARE IF:   You have blood or brown flecks (like coffee grounds) in your vomit.  You have black or bloody stools.  You have a severe headache or stiff neck.  You are confused.  You have severe abdominal pain.  You have chest pain or trouble breathing.  You do  not urinate at least once every 8 hours.  You develop cold or clammy skin.  You continue to vomit for longer than 24 to 48 hours.  You have a fever. MAKE SURE YOU:   Understand these  instructions.  Will watch your condition.  Will get help right away if you are not doing well or get worse. Document Released: 04/02/2005 Document Revised: 06/25/2011 Document Reviewed: 08/30/2010 [facility] Patient Information 2015 [place], [place]. This information is not intended to replace advice given to you by your health care provider. Make sure you discuss any questions you have with your health care provider.   Emergency Department Resource Guide 1) Find a Doctor and Pay Out of Pocket Although you won't have to find out who is covered by your insurance plan, it is a good idea to ask around and get recommendations. You will then need to call the office and see if the doctor you have chosen will accept you as a new patient and what types of options they offer for patients who are self-pay. Some doctors offer discounts or will set up payment plans for their patients who do not have insurance, but you will need to ask so you aren't surprised when you get to your appointment.  2) Contact Your Local Health Department Not all health departments have doctors that can see patients for sick visits, but many do, so it is worth a call to see if yours does. If you don't know where your local health department is, you can check in your phone book. The CDC also has a tool to help you locate your state's health department, and many state websites also have listings of all of their local health departments.  3) Find a Walk-in Clinic If your illness is not likely to be very severe or complicated, you may want to try a walk in clinic. These are popping up all over the country in pharmacies, drugstores, and shopping centers. They're usually staffed by nurse practitioners or physician assistants that have been trained to treat common illnesses and complaints. They're usually fairly quick and inexpensive. However, if you have serious medical issues or chronic medical problems, these are probably not your best  option.  No Primary Care Doctor: - Call Health Connect at  [phone] - they can help you locate a primary care doctor that  accepts your insurance, provides certain services, etc. - Physician Referral Service- [phone]  Chronic Pain Problems: Organization         Address  Phone   Notes  [facility]  [phone] Patients need to be referred by their primary care doctor.   Medication Assistance: Organization         Address  Phone   Notes  [facility] Medication [facility] [address] [place], [place] [address] [phone]) [phone] --Must be a resident of [place] -- Must have NO insurance coverage whatsoever (no Medicaid/ Medicare, etc.) -- The pt. MUST have a primary care doctor that directs their care regularly and follows them in the community   Med[facility]ist  [phone]   [employer]  [phone]    Agencies that provide inexpensive medical care: Organization         Address  Phone   Notes  [facility]  [phone]   [facility]    [phone]   [facility] [address] [place], [place] [address] [phone]) [phone]   [facility] [place] [address] [place], [place] [phone]   Planned Parenthood    [phone]   [facility]    [phone]   [facility] and [facility]  [address], [place] Phone:  [phone], Fax:  [phone] Hours of Operation:  9 am - 6 pm, M-F.  Also accepts Medicaid/Medicare and self-pay.  [facility]  [address], [place] Phone: [phone], Fax: [phone]. Hours of Operation:  8:30 am - 5:30 pm, M-F.  Also accepts Medicaid and self-pay.  [facility] [place] [address] [place] Phone: [phone]   [facility] [address] Natasha Bence [place], [place] [phone], Ext. [phone] Mondays & Thursdays: 7-9 AM.  First 15  patients are seen on a first come, first serve basis.    Medicaid-accepting [facility] Providers:  Organization         Address  Phone   Notes  [facility] [address], [place] [phone] Also accepts self-pay patients.  [facility] [address] Laurell Josephs [place], [place]  [phone]   [facility] [address], [place] [phone]   [facility] [address] [place] [phone]   Renaye Rakers [address], [place]   [phone] Only accepts [place] patients after they have their name applied to their card.   Self-Pay (no insurance) in [facility]:  Organization         Address  Phone   Notes  Sickle Cell Patients, [facility] [address] [place], [place] [phone]   [facility] [address] , [place] [phone]   [facility]  [address], [place] [phone]   [facility]/Dr. Osei-Bonsu  [address], [employer] or [address], [place] [phone] Phone number for both [place] and [place] locations is the same.  [facility] [address] [place] [phone]   [facility] [address] [place] or [address] [phone]   [facility] [place] [address] [place] [phone], phone; [phone], fax Sees patients 1st and 3rd Saturday of every month.  Must not qualify for public or private insurance (i.e. Medicaid, Medicare, [place] Health Choice, Veterans' Benefits)  Household income should be no more than 200% of the poverty level The clinic cannot treat you if you are pregnant or think you are pregnant  Sexually transmitted diseases are not treated at the clinic.    Dental  Care: Organization         Address  Phone  Notes  [facility] Department of [facility] [address] [place], [place] [phone] Accepts children up to age 92 who are enrolled in [place] or [place] Health Choice; pregnant women with a Medicaid card; and children who have applied for Medicaid or [place] Health Choice, but were declined, whose parents can pay a reduced fee at time of service.  [facility] Department of [facility]  [address], [place] [phone] Accepts children up to age 50 who are enrolled in [place] or [place] Health Choice; pregnant women with a Medicaid card; and children  who have applied for Medicaid or [place] Health Choice, but were declined, whose parents can pay a reduced fee at time of service.  [facility]  [address] [place], [place] [phone] Patients are seen by appointment only. Walk-ins are not accepted. [facility] will see patients 53 years of age and older. Monday - Tuesday (8am-5pm) Most Wednesdays (8:30-5pm) $30 per visit, cash only  [facility]  [address], [employer] [phone] Patients are seen by appointment only. Walk-ins are not accepted. [facility] will see patients 5 years of age and older. One Wednesday Evening (Monthly: Volunteer Based).  $30 per visit, cash only  [employer]  [phone] for adults; Children under age 26, call Graduate Pediatric Dentistry at [phone]. Children aged 65-14, please call [phone] to request a pediatric application.  Dental services are provided in all areas of dental care including fillings, crowns and bridges, complete and partial dentures, implants, gum treatment, root canals, and extractions. Preventive care is also provided. Treatment is provided to both adults and children. Patients are selected via a lottery and there is often a waiting list.   [facility] [address], [place]  [phone] [email or web address]   [facility] [address] [place], [place] [phone], Ext. [phone] Second and Fourth Thursday of each month, opens at 6:30 AM; Clinic ends at 9 AM.  Patients are seen on a first-come first-served basis, and a limited number are seen during each clinic.   [facility]  [address] Ether Griffins [place], [place] [phone]   Eligibility Requirements You must have lived in [place], [place], or [place] counties for at least the last three months.   You cannot be eligible for state or federal sponsored [place], including [employer], [place], or [employer].   You generally cannot be eligible for healthcare insurance through your employer.    How to apply: Eligibility screenings are held every Tuesday and Wednesday afternoon from 1:00 pm until 4:00 pm. You do not need an appointment for the interview!  [facility] [address] [place], [place] [phone]   [facility] Health Department  [phone]   [facility] Health Department  [phone]   [facility] Health Department  [phone]    Behavioral Health Resources in the Community: Intensive Outpatient Programs Organization         Address  Phone  Notes  [facility] Services [address], [place], [place] [phone]   [facility] Outpatient [address] [place], [place] [phone]   [facility]: Alcohol [facility] Drug Svcs [address] [place], [place]  [phone]   [facility] Mental Health [address]  [place], [place] [phone] or [phone]   Substance Abuse Resources Organization         Address  Phone  Notes  Alcohol and Drug Services  [phone]   [facility]  [phone]   [facility] [place]  [phone]   Floydene Flock  [phone]   Residential & Outpatient Substance Abuse Program  [phone]    Psychological Services Organization         Address  Phone  Notes  [facility]  [phone]   [facility] Services  [phone]   [facility] Mental Health [address] [place] [phone] or [phone]    [place] Crisis Teams Organization  Address  Phone  Notes  [facility], [place] Crisis Care Unit  [phone]   [facility]  [address] [place], [place] [phone]   [facility] [address] [place] [phone]    Self-Help/Support Groups Organization         Address  Phone             Notes  [facility] [place] - variety of support groups  [phone]- [phone] Call for more information  Narcotics Anonymous (NA), [facility] [address], [place]  2 meetings at this location   Statisticianesidential Treatment Programs Organization         Address  Phone  Notes  [facility] [address],    [place]  [phone]   [facility]  [address], [place] [phone], [place], [place] [phone]   [facility] [address], [place] [phone] Admissions: 8am-3pm M-F  [facility] [address]    [place], [place] [phone]   [facility] [address], [place], [place] [phone]   [facility] [address]  [place], [place] [phone]   [facility] - Intensive Outpatient [address], [place], [place] [phone]   [facility] ([facility] [address]  [place], [place] [phone] or [phone]   [facility] ([facility]) [address] [place], [place] [phone] Accepts Medicaid  [facility] [address]  [place] [phone] Substance Abuse/Addiction Treatment   [facility] Behavioral Health Resources Organization         Address  Phone  Notes  [facility]  [phone](888) [phone]   Angie FavaJulie Brannon, PhD [address] [place], [place]   [phone]) [phone] or [phone]   [facility]   [address] [place], [place] [phone]) [phone]   [facility] [address], [place], [place] [phone]) [phone] Insurance/Medicaid/sponsorship through [facility]  [facility] [address]                                    [place], [place] [phone]) [phone] Therapy/tele-psych/case  [facility] [address].   [place], [place] [phone]) [phone]    Dr. Lolly MustacheArfeen  [phone]) [phone]   [facility] [place]  [employer] [facility] 1) [address], [place] 2) [address], [place] 3)  [address], [place] [phone](336) [phone]  [phone]) [phone]   [facility] Child Abuse Hotline [phone]) [phone] or [phone]) [phone] (After Hours)

## 2014-08-24 NOTE — ED Notes (Signed)
Pt states she [facility] not [facility], pts family member in room yelling and upset b/c pt has not been seen yet, states her daughter could be dying right now and should of been seen before now, pts family member states "it doesn't take that long to see pts".

## 2014-08-24 NOTE — ED Notes (Signed)
Pt states [facility], they did nothing for her, states still having abdominal cramping 1/10 and vomiting, states took zofran yesterday and it didn't help, pt does not want it again.

## 2014-08-24 NOTE — ED Notes (Signed)
Pt ate crackers, drank ginger ale, and was eating a bowl of noodles when discharged, no vomiting noted.

## 2014-08-24 NOTE — ED Provider Notes (Signed)
CSN: [id number]     Arrival date & time 08/23/14  2218 History   First MD Initiated Contact with Patient 08/24/14 0127     Chief Complaint  Patient presents with  . Emesis     (Consider location/radiation/quality/duration/timing/severity/associated sxs/prior Treatment) HPI 19 year old female presents to the emergency department from home with complaint of one week of nausea and vomiting.  She reports about an hour after eating.  She will become extremely nauseated and vomit.  She denies any abdominal pain over the past week, however today she had some lower abdominal cramping.  She reports 1 day of white discharge, now resolved.  She denies any new sexual partners.  No fevers or chills.  [facility], was given Zofran and Phenergan.  She reports she does not like taking these medications.  Patient thinks she may be pregnant.  LMP 2-3 weeks ago. Past Medical History  Diagnosis Date  . Hypoglycemia   . Abnormal Pap smear   . Ovarian cyst   . Gonorrhea   . Chlamydia   . Asthma    Past Surgical History  Procedure Laterality Date  . Appendectomy    . Cystectomy    . Wisdom tooth extraction     Family History  Problem Relation Age of Onset  . Other Neg Hx   . Hypertension Other   . Diabetes Other    History  Substance Use Topics  . Smoking status: Current Some Day Smoker -- 0.50 packs/day    Types: Cigarettes  . Smokeless tobacco: Never Used  . Alcohol Use: Yes     Comment: social   OB History    Gravida Para Term Preterm AB TAB SAB Ectopic Multiple Living   0              Review of Systems  See History of Present Illness; otherwise all other systems are reviewed and negative  Allergies  Latex  Home Medications   Prior to Admission medications   Medication Sig Start Date End Date Taking? Authorizing Provider  famotidine (PEPCID) 20 MG tablet Take 1 tablet (20 mg total) by mouth 2 (two) times daily. 08/24/14   Brittany Severinlga Laveah Gloster, MD  metoCLOPramide  (REGLAN) 10 MG tablet Take 1 tablet (10 mg total) by mouth every 6 (six) hours as needed for nausea or refractory nausea / vomiting. 08/24/14   Brittany Severinlga Artavious Trebilcock, MD  ondansetron (ZOFRAN ODT) 4 MG disintegrating tablet Take 1 tablet (4 mg total) by mouth every 6 (six) hours as needed for nausea. Patient not taking: Reported on 08/23/2014 08/22/14   Brittany Williams, CNM  promethazine (PHENERGAN) 25 MG tablet Take 1 tablet (25 mg total) by mouth every 6 (six) hours as needed for nausea or vomiting. Patient not taking: Reported on 08/23/2014 08/22/14   Brittany Williams, CNM   BP 112/70 mmHg  Pulse 87  Temp(Src) 98.6 F (37 C) (Oral)  Resp 16  Ht 5' (1.524 m)  Wt 145 lb (65.772 kg)  BMI 28.32 kg/m2  SpO2 99%  LMP 08/04/2014 (Exact Date) Physical Exam  Constitutional: She is oriented to person, place, and time. She appears well-developed and well-nourished.  HENT:  Head: Normocephalic and atraumatic.  Nose: Nose normal.  Mouth/Throat: Oropharynx is clear and moist.  Eyes: Conjunctivae and EOM are normal. Pupils are equal, round, and reactive to light.  Neck: Normal range of motion. Neck supple. No JVD present. No tracheal deviation present. No thyromegaly present.  Cardiovascular: Normal rate, regular rhythm,  normal heart sounds and intact distal pulses.  Exam reveals no gallop and no friction rub.   No murmur heard. Pulmonary/Chest: Effort normal and breath sounds normal. No stridor. No respiratory distress. She has no wheezes. She has no rales. She exhibits no tenderness.  Abdominal: Soft. Bowel sounds are normal. She exhibits no distension and no mass. There is no tenderness. There is no rebound and no guarding.  Genitourinary:  External genitalia normal Vagina with scant white discharge Cervix  normal negative for cervical motion tenderness Adnexa palpated, no masses or negative for tenderness noted Bladder palpated negative for tenderness Uterus palpated no masses or negative for tenderness     Musculoskeletal: Normal range of motion. She exhibits no edema or tenderness.  Lymphadenopathy:    She has no cervical adenopathy.  Neurological: She is alert and oriented to person, place, and time. She displays normal reflexes. She exhibits normal muscle tone. Coordination normal.  Skin: Skin is warm and dry. No rash noted. No erythema. No pallor.  Psychiatric: She has a normal mood and affect. Her behavior is normal. Judgment and thought content normal.  Nursing note and vitals reviewed.   ED Course  Procedures (including critical care time) Labs Review Labs Reviewed  WET PREP, GENITAL - Abnormal; Notable for the following:    Clue Cells Wet Prep HPF POC MODERATE (*)    WBC, Wet Prep HPF POC FEW (*)    All other components within normal limits  CBC WITH DIFFERENTIAL/PLATELET - Abnormal; Notable for the following:    WBC 13.3 (*)    Neutro Abs 8.6 (*)    Monocytes Absolute 1.1 (*)    All other components within normal limits  COMPREHENSIVE METABOLIC PANEL - Abnormal; Notable for the following:    Calcium 8.6 (*)    Anion gap 3 (*)    All other components within normal limits  URINALYSIS, ROUTINE W REFLEX MICROSCOPIC - Abnormal; Notable for the following:    APPearance CLOUDY (*)    All other components within normal limits  LIPASE, BLOOD  POC URINE PREG, ED  GC/CHLAMYDIA PROBE AMP ([place])    Imaging Review No results found.   EKG Interpretation None      MDM   Final diagnoses:  Nausea and vomiting, vomiting of unspecified type    19 year old female with reported one week of nausea and vomiting, unable to keep anything down.  Patient eating crackers when into the room.  Pelvic exam with small amount of white discharge.  Crampy abdominal pain appears to be coming from uterine region, but is not severe at this time.  The rest of her exam is benign.  Labs are unremarkable aside from slightly elevated white blood cell count.  We'll start her on Reglan and Pepcid  and have her follow-up with a primary care doctor for further workup    Brittany Severinlga Jalia Zuniga, MD 08/24/14 2311

## 2014-09-29 ENCOUNTER — Encounter (HOSPITAL_COMMUNITY): Payer: Self-pay

## 2014-09-29 ENCOUNTER — Inpatient Hospital Stay (HOSPITAL_COMMUNITY)
Admission: AD | Admit: 2014-09-29 | Discharge: 2014-09-29 | Disposition: A | Discharge status: Home / Self Care | Payer: Medicaid Other | Source: Ambulatory Visit | Attending: Obstetrics and Gynecology | Admitting: Obstetrics and Gynecology

## 2014-09-29 DIAGNOSIS — N912 Amenorrhea, unspecified: Secondary | ICD-10-CM | POA: Insufficient documentation

## 2014-09-29 DIAGNOSIS — N926 Irregular menstruation, unspecified: Secondary | ICD-10-CM

## 2014-09-29 DIAGNOSIS — Z3201 Encounter for pregnancy test, result positive: Secondary | ICD-10-CM | POA: Insufficient documentation

## 2014-09-29 LAB — POCT PREGNANCY, URINE: Preg Test, Ur: POSITIVE — AB

## 2014-09-29 NOTE — MAU Provider Note (Signed)
Subjective:  [name] is a 19 y.o. female presenting to MAU for a pregnancy verification letter. She had 4 positive pregnancy tests at home . She denies pain or bleeding at this time.    Objective:  GENERAL: Well-developed, well-nourished female in no acute distress.  LUNGS: Effort normal SKIN: Warm, dry and without erythema PSYCH: Normal mood and affect  Filed Vitals:   09/29/14 1937  BP: 121/73  Pulse: 89  Temp: 98.3 F (36.8 C)  Resp: 16   Results for orders placed or performed during the hospital encounter of 09/29/14 (from the past 48 hour(s))  Pregnancy, urine POC     Status: Abnormal   Collection Time: 09/29/14  7:43 PM  Result Value Ref Range   Preg Test, Ur POSITIVE (A) NEGATIVE    Comment:        THE SENSITIVITY OF THIS METHODOLOGY IS >24 mIU/mL       Assessment:  1. Missed period      Plan:  Discharge home in stable condition Start prenatal care ASAP Prenatal vitamins daily Pregnancy verification letter given First trimester warning signs given.  Duane Lope, NP. 09/29/2014 .nlow

## 2014-09-29 NOTE — MAU Note (Signed)
States her to find out if she's pregnant. LMP 5/9; no birth control. Took 4 pregnancy tests at home and were all positive. Denies vaginal bleeding. Denies abdominal pain.

## 2014-10-14 ENCOUNTER — Inpatient Hospital Stay (HOSPITAL_COMMUNITY)
Admission: AD | Admit: 2014-10-14 | Discharge: 2014-10-14 | Disposition: A | Discharge status: Home / Self Care | Payer: Medicaid Other | Source: Ambulatory Visit | Attending: Obstetrics & Gynecology | Admitting: Obstetrics & Gynecology

## 2014-10-14 ENCOUNTER — Encounter (HOSPITAL_COMMUNITY): Payer: Self-pay | Admitting: *Deleted

## 2014-10-14 ENCOUNTER — Inpatient Hospital Stay (HOSPITAL_COMMUNITY): Payer: Medicaid Other

## 2014-10-14 DIAGNOSIS — O200 Threatened abortion: Secondary | ICD-10-CM

## 2014-10-14 DIAGNOSIS — J069 Acute upper respiratory infection, unspecified: Secondary | ICD-10-CM

## 2014-10-14 DIAGNOSIS — R102 Pelvic and perineal pain: Secondary | ICD-10-CM | POA: Insufficient documentation

## 2014-10-14 DIAGNOSIS — Z3A01 Less than 8 weeks gestation of pregnancy: Secondary | ICD-10-CM | POA: Insufficient documentation

## 2014-10-14 DIAGNOSIS — O26899 Other specified pregnancy related conditions, unspecified trimester: Secondary | ICD-10-CM

## 2014-10-14 DIAGNOSIS — R109 Unspecified abdominal pain: Secondary | ICD-10-CM

## 2014-10-14 DIAGNOSIS — O219 Vomiting of pregnancy, unspecified: Secondary | ICD-10-CM

## 2014-10-14 DIAGNOSIS — O26891 Other specified pregnancy related conditions, first trimester: Secondary | ICD-10-CM | POA: Insufficient documentation

## 2014-10-14 LAB — URINALYSIS, ROUTINE W REFLEX MICROSCOPIC
BILIRUBIN URINE: NEGATIVE
GLUCOSE, UA: NEGATIVE mg/dL
Hgb urine dipstick: NEGATIVE
Ketones, ur: NEGATIVE mg/dL
Leukocytes, UA: NEGATIVE
NITRITE: NEGATIVE
Protein, ur: NEGATIVE mg/dL
Specific Gravity, Urine: 1.025 (ref 1.005–1.030)
UROBILINOGEN UA: 0.2 mg/dL (ref 0.0–1.0)
pH: 6 (ref 5.0–8.0)

## 2014-10-14 LAB — CBC
HEMATOCRIT: 41.8 % (ref 36.0–46.0)
HEMOGLOBIN: 14.4 g/dL (ref 12.0–15.0)
MCH: 30.5 pg (ref 26.0–34.0)
MCHC: 34.4 g/dL (ref 30.0–36.0)
MCV: 88.6 fL (ref 78.0–100.0)
Platelets: 224 10*3/uL (ref 150–400)
RBC: 4.72 MIL/uL (ref 3.87–5.11)
RDW: 14.4 % (ref 11.5–15.5)
WBC: 11.1 10*3/uL — ABNORMAL HIGH (ref 4.0–10.5)

## 2014-10-14 LAB — WET PREP, GENITAL
Trich, Wet Prep: NONE SEEN
Yeast Wet Prep HPF POC: NONE SEEN

## 2014-10-14 LAB — HIV ANTIBODY (ROUTINE TESTING W REFLEX): HIV Screen 4th Generation wRfx: NONREACTIVE

## 2014-10-14 LAB — HCG, QUANTITATIVE, PREGNANCY: HCG, BETA CHAIN, QUANT, S: 72058 m[IU]/mL — AB (ref <5)

## 2014-10-14 MED ORDER — PROMETHAZINE HCL 25 MG PO TABS: 25.0000 mg | ORAL_TABLET | Freq: Four times a day (QID) | ORAL | Status: DC | PRN

## 2014-10-14 MED ORDER — DEXTROSE IN LACTATED RINGERS 5 % IV SOLN
INTRAVENOUS | Status: DC
  Administered 2014-10-14: 10:00:00 via INTRAVENOUS

## 2014-10-14 NOTE — MAU Provider Note (Signed)
History     CSN: [id number]  Arrival date and time: 10/14/14 [id number]   None     Chief Complaint  Patient presents with  . Abdominal Pain  . Cough  . Emesis During Pregnancy   HPI This is a 19 y.o. female at 2444w3d by LMP who presents with c/o sharp pains in lower abdomen. States they started a few days ago.  They come and go. Do not radiate. ALso states she "doesn't feel good".  General malaise with intermittent dry cough. No SOB or fever. No sore throat. States has nausea and vomitng, cannot keep anything down.   RN Note: Started a few days ago. Having sharp pains in lower. Someone at work coughed in her face, hasn't felt good since them. Generally doesn't feel good, has cough now. Denies fever. Not keeping anything down.        OB History    Gravida Para Term Preterm AB TAB SAB Ectopic Multiple Living   1               Past Medical History  Diagnosis Date  . Hypoglycemia   . Abnormal Pap smear   . Ovarian cyst   . Gonorrhea   . Chlamydia   . Asthma     Past Surgical History  Procedure Laterality Date  . Appendectomy    . Cystectomy    . Wisdom tooth extraction      Family History  Problem Relation Age of Onset  . Other Neg Hx   . Hypertension Other   . Diabetes Other     History  Substance Use Topics  . Smoking status: Current Some Day Smoker -- 0.50 packs/day    Types: Cigarettes  . Smokeless tobacco: Never Used  . Alcohol Use: Yes     Comment: social   Medical, Surgical, Family and Social histories reviewed and are listed above.  Medications and allergies reviewed.   Allergies:  Allergies  Allergen Reactions  . Latex Itching and Rash    Prescriptions prior to admission  Medication Sig Dispense Refill Last Dose  . famotidine (PEPCID) 20 MG tablet Take 1 tablet (20 mg total) by mouth 2 (two) times daily. 30 tablet 0   . metoCLOPramide (REGLAN) 10 MG tablet Take 1 tablet (10 mg total) by mouth every 6 (six) hours as needed for nausea or  refractory nausea / vomiting. 30 tablet 0     Review of Systems  Constitutional: Positive for malaise/fatigue. Negative for fever and chills.  HENT: Negative for congestion and sore throat.   Respiratory: Positive for cough. Negative for sputum production, shortness of breath and wheezing.   Gastrointestinal: Positive for nausea, vomiting and abdominal pain. Negative for diarrhea and constipation.  Genitourinary: Negative for dysuria, urgency and frequency.  Musculoskeletal: Negative for myalgias.  Neurological: Positive for weakness. Negative for dizziness, focal weakness and headaches.   Physical Exam   Blood pressure 120/73, pulse 97, temperature 98.1 F (36.7 C), temperature source Oral, resp. rate 18, height 5' (1.524 m), weight 149 lb (67.586 kg), last menstrual period 08/23/2014.  Physical Exam  Constitutional: She is oriented to person, place, and time. She appears well-developed and well-nourished. No distress.  HENT:  Head: Normocephalic.  Neck: Normal range of motion. Neck supple.  Cardiovascular: Normal rate, regular rhythm and normal heart sounds.   Respiratory: Effort normal. No respiratory distress. She has no wheezes. She has no rales. She exhibits no tenderness.  GI: Soft. She exhibits no distension and no mass.  There is tenderness (slight tenderness suprapubically). There is no rebound and no guarding.  Genitourinary: Vagina normal. No vaginal discharge found.  Cervix long and closed No bleeding No CMT Adnexae nontender   Musculoskeletal: Normal range of motion.  Neurological: She is alert and oriented to person, place, and time.  Skin: Skin is warm and dry.  Psychiatric: She has a normal mood and affect.    MAU Course  Procedures  MDM Pelvic exam done to rule out signs of ectopic pregnancy Cultures and wet prep sent Blood work ordered US ordered  Results for orders placed or performed during the hospital encounter of 10/14/14 (from the past 72 hour(s))   GC/Chlamydia probe amp ([place])not at [facility]     Status: None   Collection Time: 10/14/14 12:00 AM  Result Value Ref Range   Chlamydia Negative     Comment: Normal Reference Range - Negative   Neisseria gonorrhea Negative     Comment: Normal Reference Range - Negative  Urinalysis, Routine w reflex microscopic (not at [facility])     Status: Abnormal   Collection Time: 10/14/14  9:00 AM  Result Value Ref Range   Color, Urine YELLOW YELLOW   APPearance HAZY (A) CLEAR   Specific Gravity, Urine 1.025 1.005 - 1.030   pH 6.0 5.0 - 8.0   Glucose, UA NEGATIVE NEGATIVE mg/dL   Hgb urine dipstick NEGATIVE NEGATIVE   Bilirubin Urine NEGATIVE NEGATIVE   Ketones, ur NEGATIVE NEGATIVE mg/dL   Protein, ur NEGATIVE NEGATIVE mg/dL   Urobilinogen, UA 0.2 0.0 - 1.0 mg/dL   Nitrite NEGATIVE NEGATIVE   Leukocytes, UA NEGATIVE NEGATIVE    Comment: MICROSCOPIC NOT DONE ON URINES WITH NEGATIVE PROTEIN, BLOOD, LEUKOCYTES, NITRITE, OR GLUCOSE <1000 mg/dL.  hCG, quantitative, pregnancy     Status: Abnormal   Collection Time: 10/14/14  9:15 AM  Result Value Ref Range   hCG, Beta Chain, Quant, S 72058 (H) <5 mIU/mL    Comment:          GEST. AGE      CONC.  (mIU/mL)   <=1 WEEK        5 - 50     2 WEEKS       50 - 500     3 WEEKS       100 - 10,000     4 WEEKS     1,000 - 30,000     5 WEEKS     3,500 - 115,000   6-8 WEEKS     12,000 - 270,000    12 WEEKS     15,000 - 220,000        FEMALE AND NON-PREGNANT FEMALE:     LESS THAN 5 mIU/mL   CBC     Status: Abnormal   Collection Time: 10/14/14  9:15 AM  Result Value Ref Range   WBC 11.1 (H) 4.0 - 10.5 K/uL   RBC 4.72 3.87 - 5.11 MIL/uL   Hemoglobin 14.4 12.0 - 15.0 g/dL   HCT 16.1 09.6 - 04.5 %   MCV 88.6 78.0 - 100.0 fL   MCH 30.5 26.0 - 34.0 pg   MCHC 34.4 30.0 - 36.0 g/dL   RDW 40.9 81.1 - 91.4 %   Platelets 224 150 - 400 K/uL  HIV antibody     Status: None   Collection Time: 10/14/14  9:15 AM  Result Value Ref Range   HIV Screen 4th Generation  wRfx Non Reactive Non Reactive  Comment: (NOTE) Performed At: [facility] [address] [place], [place] [id number] Mila Homer MD VO:[id number]   Wet prep, genital     Status: Abnormal   Collection Time: 10/14/14 10:25 AM  Result Value Ref Range   Yeast Wet Prep HPF POC NONE SEEN NONE SEEN   Trich, Wet Prep NONE SEEN NONE SEEN   Clue Cells Wet Prep HPF POC MODERATE (A) NONE SEEN   WBC, Wet Prep HPF POC MODERATE (A) NONE SEEN    Comment: BACTERIA- TOO NUMEROUS TO COUNT   US Ob Comp Less 14 Wks  10/14/2014   CLINICAL DATA:  Pregnant, lower pelvic pain x3 days  EXAM: OBSTETRIC <14 WK [place] AND TRANSVAGINAL OB US  TECHNIQUE: Both transabdominal and transvaginal ultrasound examinations were performed for complete evaluation of the gestation as well as the maternal uterus, adnexal regions, and pelvic cul-de-sac. Transvaginal technique was performed to assess early pregnancy.  COMPARISON:  None.  FINDINGS: Intrauterine gestational sac: Visualized/normal in shape.  Yolk sac:  Present  Embryo:  Present  Cardiac Activity: Present  Heart Rate: 134  bpm  CRL:  10.4  mm   7 w   1 d                  [place] EDC: 06/01/2015  Maternal uterus/adnexae: Small subchronic hemorrhage.  Bilateral ovaries are within normal limits.  No pelvic ascites.  IMPRESSION: Single live intrauterine gestation with estimated gestational age [redacted] weeks 1 day by crown-rump length.   Electronically Signed   By: Charline Bills M.D.   On: 10/14/2014 10:07   US Ob Transvaginal  10/14/2014   CLINICAL DATA:  Pregnant, lower pelvic pain x3 days  EXAM: OBSTETRIC <14 WK [place] AND TRANSVAGINAL OB US  TECHNIQUE: Both transabdominal and transvaginal ultrasound examinations were performed for complete evaluation of the gestation as well as the maternal uterus, adnexal regions, and pelvic cul-de-sac. Transvaginal technique was performed to assess early pregnancy.  COMPARISON:  None.  FINDINGS: Intrauterine gestational sac: Visualized/normal  in shape.  Yolk sac:  Present  Embryo:  Present  Cardiac Activity: Present  Heart Rate: 134  bpm  CRL:  10.4  mm   7 w   1 d                  [place] EDC: 06/01/2015  Maternal uterus/adnexae: Small subchronic hemorrhage.  Bilateral ovaries are within normal limits.  No pelvic ascites.  IMPRESSION: Single live intrauterine gestation with estimated gestational age [redacted] weeks 1 day by crown-rump length.   Electronically Signed   By: Charline Bills M.D.   On: 10/14/2014 10:07     Assessment and Plan  A:  SIUP at [redacted]w[redacted]d by LMP, confirmed by [place]      Abdominal/pelvic pain, no evidence of ectopic pregnancy      General malaise with dry cough, probably respiratory virus       Nausea and vomiting of early pregnancy  P:  Discharge home       Reassured that pregnancy is intrauterine and progressing well.       Rx Phenergan for prn use at home for N/VV        Advised to start PNV       Advised to take Tylenol for aches and pains       PO fluids for hydration       Advised to start prenatal care with Dr Clearance Coots soon  Wynelle Bourgeois 10/14/2014, 9:01 AM

## 2014-10-14 NOTE — MAU Note (Signed)
Started a few days ago.  Having sharp pains in lower.  Someone at work coughed in her face, hasn't felt good since them. Generally doesn't feel good, has cough now. Denies fever.  Not keeping anything down.

## 2014-10-14 NOTE — Discharge Instructions (Signed)
First Trimester of Pregnancy °The first trimester of pregnancy is from week 1 until the end of week 12 (months 1 through 3). A week after a sperm fertilizes an egg, the egg will implant on the wall of the uterus. This embryo will begin to develop into a baby. Genes from you and your partner are forming the baby. The female genes determine whether the baby is a boy or a girl. At 6-8 weeks, the eyes and face are formed, and the heartbeat can be seen on ultrasound. At the end of 12 weeks, all the baby's organs are formed.  °Now that you are pregnant, you will want to do everything you can to have a healthy baby. Two of the most important things are to get good prenatal care and to follow your health care provider's instructions. Prenatal care is all the medical care you receive before the baby's birth. This care will help prevent, find, and treat any problems during the pregnancy and childbirth. °BODY CHANGES °Your body goes through many changes during pregnancy. The changes vary from woman to woman.  °· You may gain or lose a couple of pounds at first. °· You may feel sick to your stomach (nauseous) and throw up (vomit). If the vomiting is uncontrollable, call your health care provider. °· You may tire easily. °· You may develop headaches that can be relieved by medicines approved by your health care provider. °· You may urinate more often. Painful urination may mean you have a bladder infection. °· You may develop heartburn as a result of your pregnancy. °· You may develop constipation because certain hormones are causing the muscles that push waste through your intestines to slow down. °· You may develop hemorrhoids or swollen, bulging veins (varicose veins). °· Your breasts may begin to grow larger and become tender. Your nipples may stick out more, and the tissue that surrounds them (areola) may become darker. °· Your gums may bleed and may be sensitive to brushing and flossing. °· Dark spots or blotches (chloasma,  mask of pregnancy) may develop on your face. This will likely fade after the baby is born. °· Your menstrual periods will stop. °· You may have a loss of appetite. °· You may develop cravings for certain kinds of food. °· You may have changes in your emotions from day to day, such as being excited to be pregnant or being concerned that something may go wrong with the pregnancy and baby. °· You may have more vivid and strange dreams. °· You may have changes in your hair. These can include thickening of your hair, rapid growth, and changes in texture. Some women also have hair loss during or after pregnancy, or hair that feels dry or thin. Your hair will most likely return to normal after your baby is born. °WHAT TO EXPECT AT YOUR PRENATAL VISITS °During a routine prenatal visit: °· You will be weighed to make sure you and the baby are growing normally. °· Your blood pressure will be taken. °· Your abdomen will be measured to track your baby's growth. °· The fetal heartbeat will be listened to starting around week 10 or 12 of your pregnancy. °· Test results from any previous visits will be discussed. °Your health care provider may ask you: °· How you are feeling. °· If you are feeling the baby move. °· If you have had any abnormal symptoms, such as leaking fluid, bleeding, severe headaches, or abdominal cramping. °· If you have any questions. °Other tests   that may be performed during your first trimester include: °· Blood tests to find your blood type and to check for the presence of any previous infections. They will also be used to check for low iron levels (anemia) and Rh antibodies. Later in the pregnancy, blood tests for diabetes will be done along with other tests if problems develop. °· Urine tests to check for infections, diabetes, or protein in the urine. °· An ultrasound to confirm the proper growth and development of the baby. °· An amniocentesis to check for possible genetic problems. °· Fetal screens for  spina bifida and Down syndrome. °· You may need other tests to make sure you and the baby are doing well. °HOME CARE INSTRUCTIONS  °Medicines °· Follow your health care provider's instructions regarding medicine use. Specific medicines may be either safe or unsafe to take during pregnancy. °· Take your prenatal vitamins as directed. °· If you develop constipation, try taking a stool softener if your health care provider approves. °Diet °· Eat regular, well-balanced meals. Choose a variety of foods, such as meat or vegetable-based protein, fish, milk and low-fat dairy products, vegetables, fruits, and whole grain breads and cereals. Your health care provider will help you determine the amount of weight gain that is right for you. °· Avoid raw meat and uncooked cheese. These carry germs that can cause birth defects in the baby. °· Eating four or five small meals rather than three large meals a day may help relieve nausea and vomiting. If you start to feel nauseous, eating a few soda crackers can be helpful. Drinking liquids between meals instead of during meals also seems to help nausea and vomiting. °· If you develop constipation, eat more high-fiber foods, such as fresh vegetables or fruit and whole grains. Drink enough fluids to keep your urine clear or pale yellow. °Activity and Exercise °· Exercise only as directed by your health care provider. Exercising will help you: °¨ Control your weight. °¨ Stay in shape. °¨ Be prepared for labor and delivery. °· Experiencing pain or cramping in the lower abdomen or low back is a good sign that you should stop exercising. Check with your health care provider before continuing normal exercises. °· Try to avoid standing for long periods of time. Move your legs often if you must stand in one place for a long time. °· Avoid heavy lifting. °· Wear low-heeled shoes, and practice good posture. °· You may continue to have sex unless your health care provider directs you  otherwise. °Relief of Pain or Discomfort °· Wear a good support bra for breast tenderness.   °· Take warm sitz baths to soothe any pain or discomfort caused by hemorrhoids. Use hemorrhoid cream if your health care provider approves.   °· Rest with your legs elevated if you have leg cramps or low back pain. °· If you develop varicose veins in your legs, wear support hose. Elevate your feet for 15 minutes, 3-4 times a day. Limit salt in your diet. °Prenatal Care °· Schedule your prenatal visits by the twelfth week of pregnancy. They are usually scheduled monthly at first, then more often in the last 2 months before delivery. °· Write down your questions. Take them to your prenatal visits. °· Keep all your prenatal visits as directed by your health care provider. °Safety °· Wear your seat belt at all times when driving. °· Make a list of emergency phone numbers, including numbers for family, friends, the hospital, and police and fire departments. °General Tips °·   Ask your health care provider for a referral to a local prenatal education class. Begin classes no later than at the beginning of month 6 of your pregnancy.  Ask for help if you have counseling or nutritional needs during pregnancy. Your health care provider can offer advice or refer you to specialists for help with various needs.  Do not use hot tubs, steam rooms, or saunas.  Do not douche or use tampons or scented sanitary pads.  Do not cross your legs for long periods of time.  Avoid cat litter boxes and soil used by cats. These carry germs that can cause birth defects in the baby and possibly loss of the fetus by miscarriage or stillbirth.  Avoid all smoking, herbs, alcohol, and medicines not prescribed by your health care provider. Chemicals in these affect the formation and growth of the baby.  Schedule a dentist appointment. At home, brush your teeth with a soft toothbrush and be gentle when you floss. SEEK MEDICAL CARE IF:   You have  dizziness.  You have mild pelvic cramps, pelvic pressure, or nagging pain in the abdominal area.  You have persistent nausea, vomiting, or diarrhea.  You have a bad smelling vaginal discharge.  You have pain with urination.  You notice increased swelling in your face, hands, legs, or ankles. SEEK IMMEDIATE MEDICAL CARE IF:   You have a fever.  You are leaking fluid from your vagina.  You have spotting or bleeding from your vagina.  You have severe abdominal cramping or pain.  You have rapid weight gain or loss.  You vomit blood or material that looks like coffee grounds.  You are exposed to [place] measles and have never had them.  You are exposed to fifth disease or chickenpox.  You develop a severe headache.  You have shortness of breath.  You have any kind of trauma, such as from a fall or a car accident. Document Released: 03/27/2001 Document Revised: 08/17/2013 Document Reviewed: 02/10/2013 [facility] Patient Information 2015 [place], [place]. This information is not intended to replace advice given to you by your health care provider. Make sure you discuss any questions you have with your health care provider. Morning Sickness Morning sickness is when you feel sick to your stomach (nauseous) during pregnancy. This nauseous feeling may or may not come with vomiting. It often occurs in the morning but can be a problem any time of day. Morning sickness is most common during the first trimester, but it may continue throughout pregnancy. While morning sickness is unpleasant, it is usually harmless unless you develop severe and continual vomiting (hyperemesis gravidarum). This condition requires more intense treatment.  CAUSES  The cause of morning sickness is not completely known but seems to be related to normal hormonal changes that occur in pregnancy. RISK FACTORS You are at greater risk if you:  Experienced nausea or vomiting before your pregnancy.  Had morning sickness  during a previous pregnancy.  Are pregnant with more than one baby, such as twins. TREATMENT  Do not use any medicines (prescription, over-the-counter, or herbal) for morning sickness without first talking to your health care provider. Your health care provider may prescribe or recommend:  Vitamin B6 supplements.  Anti-nausea medicines.  The herbal medicine ginger. HOME CARE INSTRUCTIONS   Only take over-the-counter or prescription medicines as directed by your health care provider.  Taking multivitamins before getting pregnant can prevent or decrease the severity of morning sickness in most women.  Eat a piece of dry toast  or unsalted crackers before getting out of bed in the morning.  Eat five or six small meals a day.  Eat dry and bland foods (rice, baked potato). Foods high in carbohydrates are often helpful.  Do not drink liquids with your meals. Drink liquids between meals.  Avoid greasy, fatty, and spicy foods.  Get someone to cook for you if the smell of any food causes nausea and vomiting.  If you feel nauseous after taking prenatal vitamins, take the vitamins at night or with a snack.  Snack on protein foods (nuts, yogurt, cheese) between meals if you are hungry.  Eat unsweetened gelatins for desserts.  Wearing an acupressure wristband (worn for sea sickness) may be helpful.  Acupuncture may be helpful.  Do not smoke.  Get a humidifier to keep the air in your house free of odors.  Get plenty of fresh air. SEEK MEDICAL CARE IF:   Your home remedies are not working, and you need medicine.  You feel dizzy or lightheaded.  You are losing weight. SEEK IMMEDIATE MEDICAL CARE IF:   You have persistent and uncontrolled nausea and vomiting.  You pass out (faint). MAKE SURE YOU:  Understand these instructions.  Will watch your condition.  Will get help right away if you are not doing well or get worse. Document Released: 05/24/2006 Document Revised:  04/07/2013 Document Reviewed: 09/17/2012 [facility] Patient Information 2015 [place], [place]. This information is not intended to replace advice given to you by your health care provider. Make sure you discuss any questions you have with your health care provider.  Viral Infections A viral infection can be caused by different types of viruses.Most viral infections are not serious and resolve on their own. However, some infections may cause severe symptoms and may lead to further complications. SYMPTOMS Viruses can frequently cause:  Minor sore throat.  Aches and pains.  Headaches.  Runny nose.  Different types of rashes.  Watery eyes.  Tiredness.  Cough.  Loss of appetite.  Gastrointestinal infections, resulting in nausea, vomiting, and diarrhea. These symptoms do not respond to antibiotics because the infection is not caused by bacteria. However, you might catch a bacterial infection following the viral infection. This is sometimes called a "superinfection." Symptoms of such a bacterial infection may include:  Worsening sore throat with pus and difficulty swallowing.  Swollen neck glands.  Chills and a high or persistent fever.  Severe headache.  Tenderness over the sinuses.  Persistent overall ill feeling (malaise), muscle aches, and tiredness (fatigue).  Persistent cough.  Yellow, green, or brown mucus production with coughing. HOME CARE INSTRUCTIONS   Only take over-the-counter or prescription medicines for pain, discomfort, diarrhea, or fever as directed by your caregiver.  Drink enough water and fluids to keep your urine clear or pale yellow. Sports drinks can provide valuable electrolytes, sugars, and hydration.  Get plenty of rest and maintain proper nutrition. Soups and broths with crackers or rice are fine. SEEK IMMEDIATE MEDICAL CARE IF:   You have severe headaches, shortness of breath, chest pain, neck pain, or an unusual rash.  You have uncontrolled  vomiting, diarrhea, or you are unable to keep down fluids.  You or your child has an oral temperature above 102 F (38.9 C), not controlled by medicine.  Your baby is older than 3 months with a rectal temperature of 102 F (38.9 C) or higher.  Your baby is 67 months old or younger with a rectal temperature of 100.4 F (38 C) or higher. MAKE  SURE YOU:   Understand these instructions.  Will watch your condition.  Will get help right away if you are not doing well or get worse. Document Released: 01/10/2005 Document Revised: 06/25/2011 Document Reviewed: 08/07/2010 [facility] Patient Information 2015 [place]. This information is not intended to replace advice given to you by your health care provider. Make sure you discuss any questions you have with your health care provider.

## 2014-10-15 ENCOUNTER — Encounter (HOSPITAL_COMMUNITY): Payer: Self-pay | Admitting: Advanced Practice Midwife

## 2014-10-15 LAB — GC/CHLAMYDIA PROBE AMP (~~LOC~~) NOT AT ARMC
Chlamydia: NEGATIVE
Neisseria Gonorrhea: NEGATIVE

## 2014-10-27 ENCOUNTER — Telehealth: Payer: Self-pay | Admitting: *Deleted

## 2014-10-27 NOTE — Telephone Encounter (Signed)
Patient is concerned about illness during pregnancy. 7/13 1:58 VM not set up- patient has NOB scheduled, but has not been seen. Attempted to call patient- no answer.

## 2014-11-02 ENCOUNTER — Ambulatory Visit (INDEPENDENT_AMBULATORY_CARE_PROVIDER_SITE_OTHER): Payer: Medicaid Other | Admitting: Certified Nurse Midwife

## 2014-11-02 ENCOUNTER — Encounter: Payer: Self-pay | Admitting: Certified Nurse Midwife

## 2014-11-02 VITALS — BP 114/72 | HR 72 | Temp 98.3°F | Wt 148.0 lb

## 2014-11-02 DIAGNOSIS — Z3481 Encounter for supervision of other normal pregnancy, first trimester: Secondary | ICD-10-CM | POA: Insufficient documentation

## 2014-11-02 DIAGNOSIS — Z3401 Encounter for supervision of normal first pregnancy, first trimester: Secondary | ICD-10-CM | POA: Diagnosis not present

## 2014-11-02 DIAGNOSIS — B9689 Other specified bacterial agents as the cause of diseases classified elsewhere: Secondary | ICD-10-CM

## 2014-11-02 DIAGNOSIS — N760 Acute vaginitis: Secondary | ICD-10-CM | POA: Diagnosis not present

## 2014-11-02 DIAGNOSIS — A499 Bacterial infection, unspecified: Secondary | ICD-10-CM

## 2014-11-02 DIAGNOSIS — Z3402 Encounter for supervision of normal first pregnancy, second trimester: Secondary | ICD-10-CM | POA: Diagnosis not present

## 2014-11-02 LAB — POCT URINALYSIS DIPSTICK
BILIRUBIN UA: NEGATIVE
Blood, UA: NEGATIVE
GLUCOSE UA: NEGATIVE
Ketones, UA: NEGATIVE
Leukocytes, UA: NEGATIVE
NITRITE UA: NEGATIVE
Protein, UA: NEGATIVE
Spec Grav, UA: 1.025
Urobilinogen, UA: NEGATIVE
pH, UA: 5.5

## 2014-11-02 MED ORDER — CITRANATAL 90 DHA 90-1 & 300 MG PO MISC: 1.0000 | Freq: Every day | ORAL | Status: DC

## 2014-11-02 MED ORDER — METRONIDAZOLE 0.75 % VA GEL: 1.0000 | Freq: Two times a day (BID) | VAGINAL | Status: DC

## 2014-11-02 MED ORDER — TRIPLE ANTIBIOTIC 5-400-5000 EX OINT: TOPICAL_OINTMENT | Freq: Four times a day (QID) | CUTANEOUS | Status: DC

## 2014-11-02 MED ORDER — DOXYLAMINE-PYRIDOXINE 10-10 MG PO TBEC: DELAYED_RELEASE_TABLET | ORAL | Status: DC

## 2014-11-02 NOTE — Progress Notes (Signed)
Subjective:    Brittany Williams is being seen today for her first obstetrical visit.  This is not a planned pregnancy. She is at [redacted]w[redacted]d gestation. Her obstetrical history is significant for asthma. Relationship with FOB: significant other, living together. Patient does intend to breast feed. Pregnancy history fully reviewed.  Not currently employed.   The information documented in the HPI was reviewed and verified.  Menstrual History: OB History    Gravida Para Term Preterm AB TAB SAB Ectopic Multiple Living   1               Menarche age: 76  Patient's last menstrual period was 08/28/2014.    Past Medical History  Diagnosis Date  . Hypoglycemia   . Abnormal Pap smear   . Ovarian cyst   . Gonorrhea   . Chlamydia   . Asthma     Past Surgical History  Procedure Laterality Date  . Appendectomy    . Cystectomy    . Wisdom tooth extraction       (Not in a hospital admission) Allergies  Allergen Reactions  . Latex Itching and Rash    History  Substance Use Topics  . Smoking status: Former Smoker -- 0.50 packs/day    Types: Cigarettes    Quit date: 09/23/2014  . Smokeless tobacco: Never Used  . Alcohol Use: No    Family History  Problem Relation Age of Onset  . Other Neg Hx   . Hypertension Other   . Diabetes Other      Review of Systems Constitutional: negative for weight loss Gastrointestinal: + for nausea & vomiting Genitourinary:negative for genital lesions and vaginal discharge and dysuria Musculoskeletal:negative for back pain Behavioral/Psych: negative for abusive relationship, depression, illegal drug usage and tobacco use    Objective:    BP 114/72 mmHg  Pulse 72  Temp(Src) 98.3 F (36.8 C)  Wt 148 lb (67.132 kg)  LMP 08/28/2014 General Appearance:    Alert, cooperative, no distress, appears stated age  Head:    Normocephalic, without obvious abnormality, atraumatic  Eyes:    PERRL, conjunctiva/corneas clear, EOM's intact, fundi    benign, both eyes   Ears:    Normal TM's and external ear canals, both ears  Nose:   Nares normal, septum midline, mucosa normal, no drainage    or sinus tenderness  Throat:   Lips, mucosa, and tongue normal; teeth and gums normal  Neck:   Supple, symmetrical, trachea midline, no adenopathy;    thyroid:  no enlargement/tenderness/nodules; no carotid   bruit or JVD  Back:     Symmetric, no curvature, ROM normal, no CVA tenderness  Lungs:     Clear to auscultation bilaterally, respirations unlabored  Chest Wall:    No tenderness or deformity   Heart:    Regular rate and rhythm, S1 and S2 normal, no murmur, rub   or gallop  Breast Exam:    No tenderness, masses, or nipple abnormality  Abdomen:     Soft, non-tender, bowel sounds active all four quadrants,    no masses, no organomegaly  Genitalia:    Normal female without lesion, discharge or tenderness  Extremities:   Extremities normal, atraumatic, no cyanosis or edema  Pulses:   2+ and symmetric all extremities  Skin:   Skin color, texture, turgor normal, no rashes or lesions  Lymph nodes:   Cervical, supraclavicular, and axillary nodes normal  Neurologic:   CNII-XII intact, normal strength, sensation and reflexes  throughout    Cervix:  Long, thick, closed, posterior    Lab Review Urine pregnancy test Labs reviewed yes Radiologic studies reviewed yes Assessment:    Pregnancy at 7072w3d weeks   N&V in early pregnancy BV   Plan:      Prenatal vitamins.  Counseling provided regarding continued use of seat belts, cessation of alcohol consumption, smoking or use of illicit drugs; infection precautions i.e., influenza/TDAP immunizations, toxoplasmosis,CMV, parvovirus, listeria and varicella; workplace safety, exercise during pregnancy; routine dental care, safe medications, sexual activity, hot tubs, saunas, pools, travel, caffeine use, fish and methlymercury, potential toxins, hair treatments, varicose veins Weight gain recommendations per IOM  guidelines reviewed: underweight/BMI< 18.5--> gain 28 - 40 lbs; normal weight/BMI 18.5 - 24.9--> gain 25 - 35 lbs; overweight/BMI 25 - 29.9--> gain 15 - 25 lbs; obese/BMI >30->gain  11 - 20 lbs Problem list reviewed and updated. FIRST/CF mutation testing/NIPT/QUAD SCREEN/fragile X/Ashkenazi Jewish population testing/Spinal muscular atrophy discussed: requested. Role of ultrasound in pregnancy discussed; fetal survey: requested. Amniocentesis discussed: not indicated.  No orders of the defined types were placed in this encounter.   Orders Placed This Encounter  Procedures  . Culture, OB Urine  . Obstetric panel  . HIV antibody  . Hemoglobinopathy evaluation  . Varicella zoster antibody, IgG  . Vit D  25 hydroxy (rtn osteoporosis monitoring)  . POCT urine pregnancy    Follow up in 4 weeks. 50% of 30 min visit spent on counseling and coordination of care.

## 2014-11-03 LAB — OBSTETRIC PANEL
Antibody Screen: NEGATIVE
Basophils Absolute: 0 10*3/uL (ref 0.0–0.1)
Basophils Relative: 0 % (ref 0–1)
Eosinophils Absolute: 0.1 10*3/uL (ref 0.0–0.7)
Eosinophils Relative: 1 % (ref 0–5)
HEMATOCRIT: 42.1 % (ref 36.0–46.0)
HEMOGLOBIN: 14.5 g/dL (ref 12.0–15.0)
Hepatitis B Surface Ag: NEGATIVE
Lymphocytes Relative: 16 % (ref 12–46)
Lymphs Abs: 2.4 10*3/uL (ref 0.7–4.0)
MCH: 31.1 pg (ref 26.0–34.0)
MCHC: 34.4 g/dL (ref 30.0–36.0)
MCV: 90.3 fL (ref 78.0–100.0)
MPV: 10.3 fL (ref 8.6–12.4)
Monocytes Absolute: 1.2 10*3/uL — ABNORMAL HIGH (ref 0.1–1.0)
Monocytes Relative: 8 % (ref 3–12)
Neutro Abs: 11.1 10*3/uL — ABNORMAL HIGH (ref 1.7–7.7)
Neutrophils Relative %: 75 % (ref 43–77)
PLATELETS: 232 10*3/uL (ref 150–400)
RBC: 4.66 MIL/uL (ref 3.87–5.11)
RDW: 14.3 % (ref 11.5–15.5)
Rh Type: POSITIVE
Rubella: 4.74 Index — ABNORMAL HIGH (ref <0.90)
WBC: 14.8 10*3/uL — ABNORMAL HIGH (ref 4.0–10.5)

## 2014-11-03 LAB — VARICELLA ZOSTER ANTIBODY, IGG: VARICELLA IGG: 2895 {index} — AB (ref <135.00)

## 2014-11-03 LAB — VITAMIN D 25 HYDROXY (VIT D DEFICIENCY, FRACTURES): Vit D, 25-Hydroxy: 28 ng/mL — ABNORMAL LOW (ref 30–100)

## 2014-11-03 LAB — HIV ANTIBODY (ROUTINE TESTING W REFLEX): HIV: NONREACTIVE

## 2014-11-04 ENCOUNTER — Other Ambulatory Visit: Payer: Self-pay | Admitting: Certified Nurse Midwife

## 2014-11-04 DIAGNOSIS — O2341 Unspecified infection of urinary tract in pregnancy, first trimester: Secondary | ICD-10-CM

## 2014-11-04 LAB — CULTURE, OB URINE

## 2014-11-04 LAB — HEMOGLOBINOPATHY EVALUATION
HEMOGLOBIN OTHER: 0 %
HGB A2 QUANT: 2.8 % (ref 2.2–3.2)
HGB A: 97.2 % (ref 96.8–97.8)
HGB F QUANT: 0 % (ref 0.0–2.0)
HGB S QUANTITAION: 0 %

## 2014-11-04 MED ORDER — NITROFURANTOIN MONOHYD MACRO 100 MG PO CAPS: 100.0000 mg | ORAL_CAPSULE | Freq: Two times a day (BID) | ORAL | Status: AC

## 2014-11-07 ENCOUNTER — Encounter (HOSPITAL_COMMUNITY): Payer: Self-pay | Admitting: *Deleted

## 2014-11-07 ENCOUNTER — Inpatient Hospital Stay (HOSPITAL_COMMUNITY)
Admission: AD | Admit: 2014-11-07 | Discharge: 2014-11-08 | Disposition: A | Discharge status: Home / Self Care | Payer: Medicaid Other | Source: Ambulatory Visit | Attending: Obstetrics | Admitting: Obstetrics

## 2014-11-07 DIAGNOSIS — R51 Headache: Secondary | ICD-10-CM | POA: Diagnosis not present

## 2014-11-07 DIAGNOSIS — Z3A10 10 weeks gestation of pregnancy: Secondary | ICD-10-CM | POA: Diagnosis not present

## 2014-11-07 DIAGNOSIS — O219 Vomiting of pregnancy, unspecified: Secondary | ICD-10-CM

## 2014-11-07 DIAGNOSIS — O210 Mild hyperemesis gravidarum: Secondary | ICD-10-CM | POA: Diagnosis not present

## 2014-11-07 DIAGNOSIS — Z87891 Personal history of nicotine dependence: Secondary | ICD-10-CM | POA: Diagnosis not present

## 2014-11-07 NOTE — MAU Note (Signed)
Patient presents to MAU with c/o vomiting since yesterday. States unable to eat or drink for the past 2 days.

## 2014-11-08 DIAGNOSIS — O219 Vomiting of pregnancy, unspecified: Secondary | ICD-10-CM | POA: Diagnosis not present

## 2014-11-08 LAB — URINALYSIS, ROUTINE W REFLEX MICROSCOPIC
BILIRUBIN URINE: NEGATIVE
GLUCOSE, UA: NEGATIVE mg/dL
Hgb urine dipstick: NEGATIVE
KETONES UR: 40 mg/dL — AB
Nitrite: NEGATIVE
Protein, ur: NEGATIVE mg/dL
Specific Gravity, Urine: >1.03 — ABNORMAL HIGH (ref 1.005–1.030)
UROBILINOGEN UA: 0.2 mg/dL (ref 0.0–1.0)
pH: 6 (ref 5.0–8.0)

## 2014-11-08 LAB — URINE MICROSCOPIC-ADD ON

## 2014-11-08 MED ORDER — PROMETHAZINE HCL 25 MG/ML IJ SOLN
25.0000 mg | INTRAMUSCULAR | Status: AC
  Administered 2014-11-08: 25 mg via INTRAMUSCULAR
  Filled 2014-11-08: qty 1

## 2014-11-08 MED ORDER — BUTALBITAL-APAP-CAFFEINE 50-325-40 MG PO TABS
1.0000 | ORAL_TABLET | Freq: Once | ORAL | Status: AC
  Administered 2014-11-08: 1 via ORAL
  Filled 2014-11-08: qty 1

## 2014-11-08 MED ORDER — CYCLOBENZAPRINE HCL 10 MG PO TABS: 10.0000 mg | ORAL_TABLET | Freq: Two times a day (BID) | ORAL | Status: DC | PRN

## 2014-11-08 MED ORDER — METOCLOPRAMIDE HCL 10 MG PO TABS: 10.0000 mg | ORAL_TABLET | Freq: Three times a day (TID) | ORAL | Status: DC

## 2014-11-08 MED ORDER — METOCLOPRAMIDE HCL 10 MG PO TABS
10.0000 mg | ORAL_TABLET | Freq: Once | ORAL | Status: AC
  Administered 2014-11-08: 10 mg via ORAL
  Filled 2014-11-08: qty 1

## 2014-11-08 NOTE — Discharge Instructions (Signed)

## 2014-11-08 NOTE — MAU Note (Signed)
Patient c/o "migraine'' for the past couple of hours. Has not taken any medication for the pain; states unable to hold any food or drink down. Has tried taken medication at home for nausea.

## 2014-11-08 NOTE — MAU Provider Note (Signed)
History     CSN: [id number]  Arrival date and time: 11/07/14 2329   First Provider Initiated Contact with Patient 11/08/14 0030      Chief Complaint  Patient presents with  . Emesis  . Headache   HPI Brittany Williams 19 y.o. G1P0 @[redacted]w[redacted]d  presents to MAU complaining of nausea, vomiting and HA.  She has had nausea for the last 2 days and has been unable to keep anything down during that time.  She was prescribed diclegis on 7/19 but has not yet gotten rx due to needing prior auth.  She HA is 8/10, located bilat forehead hurting into ears.  Described as sharp, throbbing, worse with movement, photophobia, phonophobia.   Prior to pregnancy, she used BC powders.  Tylenol has never been helpful.   OB History    Gravida Para Term Preterm AB TAB SAB Ectopic Multiple Living   1               Past Medical History  Diagnosis Date  . Hypoglycemia   . Abnormal Pap smear   . Ovarian cyst   . Gonorrhea   . Chlamydia   . Asthma     Past Surgical History  Procedure Laterality Date  . Appendectomy    . Cystectomy    . Wisdom tooth extraction      Family History  Problem Relation Age of Onset  . Other Neg Hx   . Hypertension Other   . Diabetes Other     History  Substance Use Topics  . Smoking status: Former Smoker -- 0.50 packs/day    Types: Cigarettes    Quit date: 09/23/2014  . Smokeless tobacco: Never Used  . Alcohol Use: No    Allergies:  Allergies  Allergen Reactions  . Latex Itching and Rash    Prescriptions prior to admission  Medication Sig Dispense Refill Last Dose  . Doxylamine-Pyridoxine (DICLEGIS) 10-10 MG TBEC Take 1 tablet with lunch and breakfast.  Take 2 tabs at bedtime. 100 tablet 4   . metroNIDAZOLE (METROGEL VAGINAL) 0.75 % vaginal gel Place 1 Applicatorful vaginally 2 (two) times daily. 70 g 0   . neomycin-bacitracin-polymyxin (NEOSPORIN) [phone] ointment Apply topically 4 (four) times daily. 28.3 g 0   . nitrofurantoin, macrocrystal-monohydrate,  (MACROBID) 100 MG capsule Take 1 capsule (100 mg total) by mouth 2 (two) times daily. 14 capsule 0   . Prenat w/o A-FeCbGl-DSS-FA-DHA (CITRANATAL 90 DHA) 90-1 & 300 MG MISC Take 1 tablet by mouth daily. 60 each 6   . promethazine (PHENERGAN) 25 MG tablet Take 1 tablet (25 mg total) by mouth every 6 (six) hours as needed for nausea or vomiting. (Patient not taking: Reported on 11/02/2014) 30 tablet 2 Not Taking    ROS Pertinent ROS in HPI.  All other systems are negative.   Physical Exam   Blood pressure 123/78, pulse 89, temperature 97.6 F (36.4 C), temperature source Oral, resp. rate 16, last menstrual period 08/28/2014, SpO2 99 %.  Physical Exam  Constitutional: She is oriented to person, place, and time. She appears well-developed and well-nourished. No distress.  HENT:  Head: Normocephalic and atraumatic.  Eyes: EOM are normal.  Neck: Normal range of motion.  Cardiovascular: Normal rate.   Respiratory: Effort normal and breath sounds normal. No respiratory distress.  GI: Soft. Bowel sounds are normal. She exhibits no distension. There is tenderness.  Musculoskeletal: Normal range of motion.  Neurological: She is alert and oriented to person, place, and time.  Skin:  Skin is warm and dry.  Psychiatric: She has a normal mood and affect.    MAU Course  Procedures  MDM U/A with 40 ketones.  Will hold on IV fluids, give IM phenergan and then po hydration. If HA unimproved with phenergan, will give po fioricet.   Discussed with pt can give phenergan suppositories and fioricet rx for home use until able to see [facility] provider again. Report given and [facility] [place].  Bertram Denver 11/08/2014, 12:35 AM   0145 Pt reports continued feeling of nausea and headache.  Proceed with administration of fioricet.  0250 Reports improvement in both headache and nausea.  Able to drink soda and eat crackers without difficulty.   Assessment and Plan   Nausea and  vomiting in pregnancy Headache in pregnancy  Plan: Discharge to home RX Reglan RX Flexeril Keep scheduled appt  Marlis Edelson, CNM

## 2014-11-09 ENCOUNTER — Other Ambulatory Visit: Payer: Self-pay | Admitting: Certified Nurse Midwife

## 2014-11-09 DIAGNOSIS — O2341 Unspecified infection of urinary tract in pregnancy, first trimester: Secondary | ICD-10-CM

## 2014-11-09 LAB — URINE CULTURE

## 2014-11-09 MED ORDER — CEFUROXIME AXETIL 250 MG/5ML PO SUSR
ORAL | Status: DC
Start: 2014-11-09 — End: 2015-02-22

## 2014-11-30 ENCOUNTER — Ambulatory Visit: Payer: Medicaid Other | Admitting: Certified Nurse Midwife

## 2014-11-30 VITALS — BP 112/74 | HR 102 | Temp 98.2°F | Wt 148.1 lb

## 2014-11-30 DIAGNOSIS — Z3481 Encounter for supervision of other normal pregnancy, first trimester: Secondary | ICD-10-CM

## 2014-11-30 DIAGNOSIS — K5901 Slow transit constipation: Secondary | ICD-10-CM

## 2014-11-30 DIAGNOSIS — O219 Vomiting of pregnancy, unspecified: Secondary | ICD-10-CM

## 2014-11-30 LAB — POCT URINALYSIS DIPSTICK
Bilirubin, UA: NEGATIVE
Glucose, UA: NORMAL
Ketones, UA: NEGATIVE
Leukocytes, UA: NEGATIVE
Nitrite, UA: NEGATIVE
RBC UA: NEGATIVE
SPEC GRAV UA: 1.02
UROBILINOGEN UA: NEGATIVE
pH, UA: 5.5

## 2014-11-30 MED ORDER — ONDANSETRON HCL 4 MG PO TABS: 4.0000 mg | ORAL_TABLET | Freq: Every day | ORAL | Status: DC | PRN

## 2014-11-30 MED ORDER — SENNOSIDES-DOCUSATE SODIUM 8.6-50 MG PO TABS: 2.0000 | ORAL_TABLET | Freq: Every day | ORAL | Status: DC | PRN

## 2014-11-30 NOTE — Progress Notes (Signed)
  Subjective:    Brittany Williams is a 19 y.o. female being seen today for her obstetrical visit. She is at [redacted]w[redacted]d gestation. Patient reports: nausea, no bleeding, no contractions, no cramping, no leaking and vomiting.  Guidance given for constipation.  Patient expressed interest in waterbirth, information given.    Problem List Items Addressed This Visit    None    Visit Diagnoses    Nausea and vomiting during pregnancy prior to [redacted] weeks gestation    -  Primary    Relevant Medications    ondansetron (ZOFRAN) 4 MG tablet    Slow transit constipation        Relevant Medications    senna-docusate (SENOKOT-S) 8.6-50 MG per tablet      Patient Active Problem List   Diagnosis Date Noted  . Encounter for supervision of other normal pregnancy in first trimester 11/02/2014    Objective:     BP 112/74 mmHg  Pulse 102  Temp(Src) 98.2 F (36.8 C)  Wt 148 lb 1.6 oz (67.178 kg)  LMP 08/28/2014 Uterine Size: Below umbilicus   FHR: 150's  Assessment:    Pregnancy @ [redacted]w[redacted]d  weeks N&V in early pregnancy: precautions given Constipation    Plan:    Problem list reviewed and updated. Labs reviewed. Follow up in 4 weeks with AFP/Quad screen. FIRST/CF mutation testing/NIPT/QUAD SCREEN/fragile X/Ashkenazi Jewish population testing/Spinal muscular atrophy discussed: requested. Role of ultrasound in pregnancy discussed; fetal survey: requested. Amniocentesis discussed: not indicated. 50% of 15 minute visit spent on counseling and coordination of care.

## 2014-12-28 ENCOUNTER — Ambulatory Visit (INDEPENDENT_AMBULATORY_CARE_PROVIDER_SITE_OTHER): Payer: Medicaid Other | Admitting: Certified Nurse Midwife

## 2014-12-28 ENCOUNTER — Encounter: Payer: Medicaid Other | Admitting: Certified Nurse Midwife

## 2014-12-28 VITALS — BP 114/69 | HR 89 | Temp 98.0°F | Wt 152.0 lb

## 2014-12-28 DIAGNOSIS — Z3402 Encounter for supervision of normal first pregnancy, second trimester: Secondary | ICD-10-CM

## 2014-12-28 DIAGNOSIS — B373 Candidiasis of vulva and vagina: Secondary | ICD-10-CM

## 2014-12-28 DIAGNOSIS — G44211 Episodic tension-type headache, intractable: Secondary | ICD-10-CM

## 2014-12-28 DIAGNOSIS — B3731 Acute candidiasis of vulva and vagina: Secondary | ICD-10-CM

## 2014-12-28 LAB — POCT URINALYSIS DIPSTICK
Bilirubin, UA: NEGATIVE
Blood, UA: NEGATIVE
Glucose, UA: NEGATIVE
Ketones, UA: NEGATIVE
LEUKOCYTES UA: NEGATIVE
NITRITE UA: NEGATIVE
PH UA: 5
Protein, UA: NEGATIVE
Spec Grav, UA: 1.025
UROBILINOGEN UA: NEGATIVE

## 2014-12-28 MED ORDER — BUTALBITAL-APAP-CAFFEINE 50-325-40 MG PO TABS: 1.0000 | ORAL_TABLET | Freq: Four times a day (QID) | ORAL | Status: DC | PRN

## 2014-12-28 MED ORDER — TERCONAZOLE 0.4 % VA CREA: 1.0000 | TOPICAL_CREAM | Freq: Every day | VAGINAL | Status: DC

## 2014-12-28 MED ORDER — FLUCONAZOLE 100 MG PO TABS: 100.0000 mg | ORAL_TABLET | Freq: Once | ORAL | Status: DC

## 2014-12-28 NOTE — Progress Notes (Signed)
  Subjective:    Brittany Williams is a 19 y.o. female being seen today for her obstetrical visit. She is at [redacted]w[redacted]d gestation. Patient reports: no bleeding, no contractions, no cramping, no leaking and vaginal irritation.  Is having vaginal irritation with sexual intercourse, denies odor, is having discharge, chunky white with itching.    Problem List Items Addressed This Visit    None    Visit Diagnoses    Supervision of normal first pregnancy in second trimester    -  Primary    Relevant Orders    POCT urinalysis dipstick (Completed)    AFP, Quad Screen    US OB Comp + 14 Wk    Intractable episodic tension-type headache        Relevant Medications    butalbital-acetaminophen-caffeine (FIORICET) 50-325-40 MG per tablet    Vulvovaginal candidiasis        Relevant Medications    terconazole (TERAZOL 7) 0.4 % vaginal cream    fluconazole (DIFLUCAN) 100 MG tablet      Patient Active Problem List   Diagnosis Date Noted  . Encounter for supervision of other normal pregnancy in first trimester 11/02/2014    Objective:     BP 114/69 mmHg  Pulse 89  Temp(Src) 98 F (36.7 C)  Wt 152 lb (68.947 kg)  LMP 08/28/2014 Uterine Size: Below umbilicus   FHR: 150 by doppler Cervix:  +chunky white discharge, slight erythema, cervix:  Long, closed, thick and posterior.   Assessment:    Pregnancy @ [redacted]w[redacted]d  weeks Doing well    Vulvovaginitis  Plan:    Problem list reviewed and updated. Labs reviewed.  Follow up in 4 weeks. FIRST/CF mutation testing/NIPT/QUAD SCREEN/fragile X/Ashkenazi Jewish population testing/Spinal muscular atrophy discussed: ordered. Role of ultrasound in pregnancy discussed; fetal survey: ordered. Amniocentesis discussed: not indicated. 50% of 15 minute visit spent on counseling and coordination of care.

## 2014-12-28 NOTE — Addendum Note (Signed)
Addended by: Marya Landry D on: 12/28/2014 04:52 PM   Modules accepted: Orders

## 2014-12-29 LAB — AFP, QUAD SCREEN
AFP: 49.3 ng/mL
Curr Gest Age: 17.6 wks.days
HCG, Total: 24.56 IU/mL
INH: 192.6 pg/mL
INTERPRETATION-AFP: NEGATIVE
MOM FOR AFP: 1.15
MoM for INH: 1.15
MoM for hCG: 0.88
OPEN SPINA BIFIDA: NEGATIVE
TRI 18 SCR RISK EST: NEGATIVE
Trisomy 18 (Edward) Syndrome Interp.: 1:86700 {titer}
UE3 VALUE: 1.25 ng/mL
uE3 Mom: 1.04

## 2014-12-31 LAB — SURESWAB, VAGINOSIS/VAGINITIS PLUS
Atopobium vaginae: DETECTED Log (cells/mL)
BV CATEGORY: UNDETERMINED — AB
C. GLABRATA, DNA: NOT DETECTED
C. PARAPSILOSIS, DNA: NOT DETECTED
C. TROPICALIS, DNA: NOT DETECTED
C. albicans, DNA: DETECTED — AB
C. trachomatis RNA, TMA: NOT DETECTED
GARDNERELLA VAGINALIS: 7 Log (cells/mL)
LACTOBACILLUS SPECIES: 7.5 Log (cells/mL)
MEGASPHAERA SPECIES: 6.3 Log (cells/mL)
N. GONORRHOEAE RNA, TMA: NOT DETECTED
T. vaginalis RNA, QL TMA: NOT DETECTED

## 2015-01-04 ENCOUNTER — Other Ambulatory Visit: Payer: Self-pay | Admitting: Certified Nurse Midwife

## 2015-01-04 DIAGNOSIS — B373 Candidiasis of vulva and vagina: Secondary | ICD-10-CM

## 2015-01-04 DIAGNOSIS — N760 Acute vaginitis: Principal | ICD-10-CM

## 2015-01-04 DIAGNOSIS — B3731 Acute candidiasis of vulva and vagina: Secondary | ICD-10-CM

## 2015-01-04 DIAGNOSIS — B9689 Other specified bacterial agents as the cause of diseases classified elsewhere: Secondary | ICD-10-CM

## 2015-01-04 MED ORDER — TERCONAZOLE 0.4 % VA CREA: 1.0000 | TOPICAL_CREAM | Freq: Every day | VAGINAL | Status: DC

## 2015-01-04 MED ORDER — METRONIDAZOLE 500 MG PO TABS: 500.0000 mg | ORAL_TABLET | Freq: Two times a day (BID) | ORAL | Status: DC

## 2015-01-04 MED ORDER — FLUCONAZOLE 100 MG PO TABS: 100.0000 mg | ORAL_TABLET | Freq: Once | ORAL | Status: DC

## 2015-01-11 ENCOUNTER — Encounter: Payer: Self-pay | Admitting: *Deleted

## 2015-01-12 ENCOUNTER — Telehealth: Payer: Self-pay

## 2015-01-12 NOTE — Telephone Encounter (Signed)
COULD NOT REACH PATIENT BY PHONE, TO REMIND HER OF U/S APPT ON 9/29 AT 1PM HERE AT [facility] - SENT HER EMAIL USING ADDRESS WE HAVE IN [place]

## 2015-01-13 ENCOUNTER — Ambulatory Visit (INDEPENDENT_AMBULATORY_CARE_PROVIDER_SITE_OTHER): Payer: Medicaid Other

## 2015-01-13 DIAGNOSIS — Z36 Encounter for antenatal screening of mother: Secondary | ICD-10-CM | POA: Diagnosis not present

## 2015-01-13 DIAGNOSIS — Z3402 Encounter for supervision of normal first pregnancy, second trimester: Secondary | ICD-10-CM

## 2015-01-25 ENCOUNTER — Ambulatory Visit (INDEPENDENT_AMBULATORY_CARE_PROVIDER_SITE_OTHER): Payer: Medicaid Other | Admitting: Certified Nurse Midwife

## 2015-01-25 ENCOUNTER — Encounter: Payer: Medicaid Other | Admitting: Certified Nurse Midwife

## 2015-01-25 VITALS — BP 114/70 | HR 97 | Temp 98.2°F | Wt 160.0 lb

## 2015-01-25 DIAGNOSIS — Z3402 Encounter for supervision of normal first pregnancy, second trimester: Secondary | ICD-10-CM

## 2015-01-25 LAB — POCT URINALYSIS DIPSTICK
BILIRUBIN UA: NEGATIVE
Blood, UA: NEGATIVE
Glucose, UA: NEGATIVE
KETONES UA: NEGATIVE
LEUKOCYTES UA: NEGATIVE
Nitrite, UA: NEGATIVE
PH UA: 8.5
PROTEIN UA: NEGATIVE
Urobilinogen, UA: NEGATIVE

## 2015-01-26 NOTE — Progress Notes (Signed)
Subjective:    Brittany Williams is a 19 y.o. female being seen today for her obstetrical visit. She is at 4378w4d gestation. Patient reports: backache, no bleeding, no contractions, no cramping, no leaking and lower pelvic pain with movement. Discussed increasing pillows, pelvic rest, massage, limiting lifting to less than 10 lbs. Fetal movement: normal.  Problem List Items Addressed This Visit    None    Visit Diagnoses    Encounter for supervision of normal first pregnancy in second trimester    -  Primary    Relevant Orders    POCT urinalysis dipstick (Completed)      Patient Active Problem List   Diagnosis Date Noted  . Encounter for supervision of other normal pregnancy in first trimester 11/02/2014   Objective:    BP 114/70 mmHg  Pulse 97  Temp(Src) 98.2 F (36.8 C)  Wt 160 lb (72.576 kg)  LMP 08/28/2014 FHT: 150 BPM  Uterine Size: 21 cm and size equals dates     Assessment:    Pregnancy @ 8478w4d    Pelvic girdle pain  Plan:    OBGCT: discussed. Signs and symptoms of preterm labor: discussed.  Labs, problem list reviewed and updated 2 hr GTT planned Follow up in 4 weeks.

## 2015-01-30 ENCOUNTER — Encounter (HOSPITAL_COMMUNITY): Payer: Self-pay | Admitting: *Deleted

## 2015-01-30 ENCOUNTER — Inpatient Hospital Stay (HOSPITAL_COMMUNITY)
Admit: 2015-01-30 | Discharge: 2015-01-30 | Disposition: A | Discharge status: Home / Self Care | Payer: Medicaid Other | Source: Ambulatory Visit | Attending: Obstetrics | Admitting: Obstetrics

## 2015-01-30 DIAGNOSIS — Z3492 Encounter for supervision of normal pregnancy, unspecified, second trimester: Secondary | ICD-10-CM | POA: Insufficient documentation

## 2015-01-30 DIAGNOSIS — O9989 Other specified diseases and conditions complicating pregnancy, childbirth and the puerperium: Secondary | ICD-10-CM | POA: Diagnosis not present

## 2015-01-30 DIAGNOSIS — N949 Unspecified condition associated with female genital organs and menstrual cycle: Secondary | ICD-10-CM | POA: Diagnosis not present

## 2015-01-30 DIAGNOSIS — R102 Pelvic and perineal pain: Secondary | ICD-10-CM

## 2015-01-30 DIAGNOSIS — O26899 Other specified pregnancy related conditions, unspecified trimester: Secondary | ICD-10-CM

## 2015-01-30 LAB — URINALYSIS, ROUTINE W REFLEX MICROSCOPIC
Bilirubin Urine: NEGATIVE
GLUCOSE, UA: NEGATIVE mg/dL
Hgb urine dipstick: NEGATIVE
KETONES UR: 15 mg/dL — AB
LEUKOCYTES UA: NEGATIVE
Nitrite: NEGATIVE
PH: 6 (ref 5.0–8.0)
Protein, ur: NEGATIVE mg/dL
Specific Gravity, Urine: 1.02 (ref 1.005–1.030)
Urobilinogen, UA: 0.2 mg/dL (ref 0.0–1.0)

## 2015-01-30 NOTE — Discharge Instructions (Signed)

## 2015-01-30 NOTE — MAU Note (Signed)
After pt talked with [place] police officer she was ready to leave;

## 2015-01-30 NOTE — MAU Note (Signed)
Left before getting her discharge instructions;

## 2015-01-30 NOTE — MAU Provider Note (Signed)
Chief Complaint: Labor Eval   First Provider Initiated Contact with Patient 01/30/15 1606     SUBJECTIVE HPI: Brittany Williams is a 19 y.o. G1P0 at [redacted]w[redacted]d who presents to Maternity Admissions by EMS reporting low abd pain that started while she was being arrested for shoplifting. Pain has resolved by arrival to MAU.  Denies any injury or abd trauma.   Location: supra-pubic  Quality: cramping Severity: Moderate when it occurred. None now.  Duration: >1 hour Context: Occurred while being arrested.  Timing: intermittent Modifying factors: Resolved spontaneously. Associated signs and symptoms: Neg for fever, chills, VB, vaginal discharge.  Past Medical History  Diagnosis Date  . Hypoglycemia   . Abnormal Pap smear   . Ovarian cyst   . Gonorrhea   . Chlamydia   . Asthma    OB History  Gravida Para Term Preterm AB SAB TAB Ectopic Multiple Living  1             # Outcome Date GA Lbr Len/2nd Weight Sex Delivery Anes PTL Lv  1 Current              Past Surgical History  Procedure Laterality Date  . Appendectomy    . Cystectomy    . Wisdom tooth extraction     Social History   Social History  . Marital Status: Single    Spouse Name: N/A  . Number of Children: N/A  . Years of Education: N/A   Occupational History  . Not on file.   Social History Main Topics  . Smoking status: Former Smoker -- 0.50 packs/day    Types: Cigarettes    Quit date: 09/23/2014  . Smokeless tobacco: Never Used  . Alcohol Use: No  . Drug Use: No  . Sexual Activity: Yes    Birth Control/ Protection: None   Other Topics Concern  . Not on file   Social History Narrative   No current facility-administered medications on file prior to encounter.   Current Outpatient Prescriptions on File Prior to Encounter  Medication Sig Dispense Refill  . Prenat w/o A-FeCbGl-DSS-FA-DHA (CITRANATAL 90 DHA) 90-1 & 300 MG MISC Take 1 tablet by mouth daily. 60 each 6  . senna-docusate (SENOKOT-S) 8.6-50 MG  per tablet Take 2 tablets by mouth daily as needed for mild constipation. 90 tablet 1  . terconazole (TERAZOL 7) 0.4 % vaginal cream Place 1 applicator vaginally at bedtime. 45 g 0  . cefUROXime (CEFTIN) 250 MG/5ML suspension Take 10 ml by mouth twice daily for 7 days. (Patient not taking: Reported on 11/30/2014) 150 mL 0  . metroNIDAZOLE (FLAGYL) 500 MG tablet Take 1 tablet (500 mg total) by mouth 2 (two) times daily. (Patient not taking: Reported on 01/30/2015) 14 tablet 0   Allergies  Allergen Reactions  . Latex Itching and Rash    I have reviewed the past Medical Hx, Surgical Hx, Social Hx, Allergies and Medications.   Review of Systems  Constitutional: Negative for fever and chills.  Gastrointestinal: Positive for abdominal pain. Negative for nausea, vomiting, diarrhea and constipation.  Genitourinary: Negative for dysuria, urgency, frequency, hematuria, flank pain, vaginal bleeding and vaginal discharge.  Musculoskeletal: Negative for back pain.    OBJECTIVE LMP 08/28/2014  Constitutional: Well-developed, well-nourished female in no acute distress, laughing, talking to support person.  Respiratory: normal rate and effort.  GI: Abd soft, non-tender, gravid appropriate for gestational age. Pos BS x 4 MS: Extremities nontender, no edema, normal ROM Neurologic: Alert and oriented x 4.  GU: NEFG, physiologic discharge, no blood noted, cervix long and closed. No adnexal tenderness or masses. No CMT.  FHR 153 by doppler No UC's per toco  LAB RESULTS No results found for this or any previous visit (from the past 24 hour(s)).  IMAGING No results found.  MAU COURSE Cervical exam  MDM Female at [redacted] weeks gestation who experienced low abd pain ~1 hour after being arrested w/out any use of force or injury. Pain C/W round ligament pain. No evidence of preterm labor.   ASSESSMENT 1. Pain of round ligament affecting pregnancy, antepartum     PLAN Discharge home in stable  condition. Preterm labor Precautions Follow-up Information    Follow up with Roe Coombsachelle A Denney, CNM On 02/22/2015.   Specialty:  Certified Nurse Midwife   Why:  Routine prenatal visit or sooner as needed if symptoms worsen   Contact information:   [address] [place] [id number] [phone]       [facility].   Why:  As needed in emergencies   Contact information:   [address] [id number] mc [place] [id number] [phone]       Medication List    STOP taking these medications        butalbital-acetaminophen-caffeine 50-325-40 MG tablet  Commonly known as:  FIORICET     cyclobenzaprine 10 MG tablet  Commonly known as:  FLEXERIL     Doxylamine-Pyridoxine 10-10 MG Tbec  Commonly known as:  DICLEGIS     fluconazole 100 MG tablet  Commonly known as:  DIFLUCAN     metoCLOPramide 10 MG tablet  Commonly known as:  REGLAN     metroNIDAZOLE 0.75 % vaginal gel  Commonly known as:  METROGEL VAGINAL     neomycin-bacitracin-polymyxin [phone] ointment     ondansetron 4 MG tablet  Commonly known as:  ZOFRAN      TAKE these medications        acetaminophen 500 MG tablet  Commonly known as:  TYLENOL  Take 500 mg by mouth every 6 (six) hours as needed for headache.     CITRANATAL 90 DHA 90-1 & 300 MG Misc  Take 1 tablet by mouth daily.     metroNIDAZOLE 500 MG tablet  Commonly known as:  FLAGYL  Take 1 tablet (500 mg total) by mouth 2 (two) times daily.     senna-docusate 8.6-50 MG tablet  Commonly known as:  Senokot-S  Take 2 tablets by mouth daily as needed for mild constipation.     terconazole 0.4 % vaginal cream  Commonly known as:  TERAZOL 7  Place 1 applicator vaginally at bedtime.      ASK your doctor about these medications        cefUROXime 250 MG/5ML suspension  Commonly known as:  CEFTIN  Take 10 ml by mouth twice daily for 7 days.        [place] Jabori Henegar, [place] 01/30/2015  6:23 PM

## 2015-01-30 NOTE — MAU Note (Addendum)
Pt brought in by EMS after being arrested for shoplifting; FHR dopplered at 153; no ucs tracing;

## 2015-02-22 ENCOUNTER — Ambulatory Visit (INDEPENDENT_AMBULATORY_CARE_PROVIDER_SITE_OTHER): Payer: Medicaid Other | Admitting: Certified Nurse Midwife

## 2015-02-22 VITALS — BP 124/74 | HR 93 | Temp 97.8°F | Wt 166.0 lb

## 2015-02-22 DIAGNOSIS — Z3402 Encounter for supervision of normal first pregnancy, second trimester: Secondary | ICD-10-CM

## 2015-02-22 NOTE — Progress Notes (Signed)
Subjective:    Brittany Williams is a 19 y.o. female being seen today for her obstetrical visit. She is at 3949w3d gestation. Patient reports: no complaints . Fetal movement: normal.  Desires water birth and is going to take the class in a few weeks.    Problem List Items Addressed This Visit    None    Visit Diagnoses    Encounter for supervision of normal first pregnancy in second trimester    -  Primary    Relevant Orders    POCT urinalysis dipstick      Patient Active Problem List   Diagnosis Date Noted  . Encounter for supervision of other normal pregnancy in first trimester 11/02/2014   Objective:    BP 124/74 mmHg  Pulse 93  Temp(Src) 97.8 F (36.6 C)  Wt 166 lb (75.297 kg)  LMP 08/28/2014 FHT: 150 BPM  Uterine Size: size equals dates     Assessment:    Pregnancy @ 7549w3d    Plan:    OBGCT: discussed and ordered for next visit. Signs and symptoms of preterm labor: discussed.  Labs, problem list reviewed and updated 2 hr GTT planned Follow up in 3 weeks.

## 2015-03-15 ENCOUNTER — Other Ambulatory Visit: Payer: Self-pay | Admitting: Certified Nurse Midwife

## 2015-03-15 ENCOUNTER — Ambulatory Visit (INDEPENDENT_AMBULATORY_CARE_PROVIDER_SITE_OTHER): Payer: Medicaid Other | Admitting: Certified Nurse Midwife

## 2015-03-15 ENCOUNTER — Other Ambulatory Visit: Payer: Medicaid Other

## 2015-03-15 VITALS — BP 118/77 | HR 98 | Wt 170.0 lb

## 2015-03-15 DIAGNOSIS — Z3403 Encounter for supervision of normal first pregnancy, third trimester: Secondary | ICD-10-CM

## 2015-03-15 LAB — POCT URINALYSIS DIPSTICK
BILIRUBIN UA: NEGATIVE
Blood, UA: NEGATIVE
Glucose, UA: NEGATIVE
KETONES UA: NEGATIVE
NITRITE UA: NEGATIVE
Spec Grav, UA: <=1.005
UROBILINOGEN UA: NEGATIVE
pH, UA: 8

## 2015-03-15 NOTE — Progress Notes (Signed)
Subjective:    Brittany Williams is a 19 y.o. female being seen today for her obstetrical visit. She is at 5942w3d gestation. Patient reports no complaints. Fetal movement: normal.  Problem List Items Addressed This Visit    None    Visit Diagnoses    Encounter for supervision of normal first pregnancy in third trimester    -  Primary    Relevant Orders    Glucose Tolerance, 2 Hours w/1 Hour    CBC    HIV antibody    RPR    POCT urinalysis dipstick (Completed)      Patient Active Problem List   Diagnosis Date Noted  . Encounter for supervision of other normal pregnancy in first trimester 11/02/2014   Objective:    BP 118/77 mmHg  Pulse 98  Wt 170 lb (77.111 kg)  LMP 08/28/2014 FHT:  140 BPM  Uterine Size: size equals dates  Presentation: unsure     Assessment:    Pregnancy @ 542w3d weeks   Doing well.   Desires water birth Plan:     labs reviewed, problem list updated Consent signed. GBS planning TDAP offered  Rhogam given for RH negative Pediatrician: discussed. Infant feeding: plans to breastfeed. Maternity leave: discussed. Cigarette smoking: never smoked. Orders Placed This Encounter  Procedures  . Glucose Tolerance, 2 Hours w/1 Hour  . CBC  . HIV antibody  . RPR  . POCT urinalysis dipstick   No orders of the defined types were placed in this encounter.   Follow up in 2 Weeks.

## 2015-03-16 LAB — CBC
HCT: 39.3 % (ref 36.0–46.0)
HEMOGLOBIN: 13 g/dL (ref 12.0–15.0)
MCH: 30.3 pg (ref 26.0–34.0)
MCHC: 33.1 g/dL (ref 30.0–36.0)
MCV: 91.6 fL (ref 78.0–100.0)
MPV: 10.1 fL (ref 8.6–12.4)
PLATELETS: 243 10*3/uL (ref 150–400)
RBC: 4.29 MIL/uL (ref 3.87–5.11)
RDW: 13.8 % (ref 11.5–15.5)
WBC: 16 10*3/uL — AB (ref 4.0–10.5)

## 2015-03-16 LAB — RPR

## 2015-03-16 LAB — HIV ANTIBODY (ROUTINE TESTING W REFLEX): HIV: NONREACTIVE

## 2015-03-16 LAB — GLUCOSE TOLERANCE, 2 HOURS W/ 1HR
GLUCOSE, 2 HOUR: 91 mg/dL (ref 70–139)
GLUCOSE: 125 mg/dL (ref 70–170)
Glucose, Fasting: 70 mg/dL (ref 65–99)

## 2015-03-29 ENCOUNTER — Ambulatory Visit (INDEPENDENT_AMBULATORY_CARE_PROVIDER_SITE_OTHER): Payer: Medicaid Other | Admitting: Certified Nurse Midwife

## 2015-03-29 VITALS — BP 111/75 | HR 102 | Temp 97.7°F | Wt 173.0 lb

## 2015-03-29 DIAGNOSIS — Z3403 Encounter for supervision of normal first pregnancy, third trimester: Secondary | ICD-10-CM

## 2015-03-29 LAB — POCT URINALYSIS DIPSTICK
BILIRUBIN UA: NEGATIVE
Blood, UA: NEGATIVE
Glucose, UA: NEGATIVE
KETONES UA: NEGATIVE
LEUKOCYTES UA: NEGATIVE
Nitrite, UA: NEGATIVE
Urobilinogen, UA: NEGATIVE
pH, UA: 7.5

## 2015-03-29 NOTE — Progress Notes (Signed)
Subjective:    Brittany Williams is a 19 y.o. female being seen today for her obstetrical visit. She is at 2853w3d gestation. Patient reports no complaints. Fetal movement: normal.  Patient still desires for a water birth.    Problem List Items Addressed This Visit    None    Visit Diagnoses    Encounter for supervision of normal first pregnancy in third trimester    -  Primary    Relevant Orders    POCT urinalysis dipstick (Completed)      Patient Active Problem List   Diagnosis Date Noted  . Encounter for supervision of other normal pregnancy in first trimester 11/02/2014   Objective:    BP 111/75 mmHg  Pulse 102  Temp(Src) 97.7 F (36.5 C)  Wt 173 lb (78.472 kg)  LMP 08/28/2014 FHT:  130 BPM  Uterine Size: size equals dates  Presentation: cephalic     Assessment:    Pregnancy @ 3653w3d weeks   Plan:     labs reviewed, problem list updated Consent signed. GBS planning TDAP offered  Rhogam given for RH negative Pediatrician: discussed. Infant feeding: plans to breastfeed. Maternity leave: discussed. Cigarette smoking: never smoked. Orders Placed This Encounter  Procedures  . POCT urinalysis dipstick   No orders of the defined types were placed in this encounter.   Follow up in 2 Weeks.

## 2015-04-10 ENCOUNTER — Encounter (HOSPITAL_COMMUNITY): Payer: Self-pay

## 2015-04-10 ENCOUNTER — Inpatient Hospital Stay (HOSPITAL_COMMUNITY)
Admission: AD | Admit: 2015-04-10 | Discharge: 2015-04-11 | Disposition: A | Discharge status: Home / Self Care | Payer: Medicaid Other | Source: Ambulatory Visit | Attending: Obstetrics | Admitting: Obstetrics

## 2015-04-10 DIAGNOSIS — F430 Acute stress reaction: Secondary | ICD-10-CM | POA: Diagnosis not present

## 2015-04-10 DIAGNOSIS — Z87891 Personal history of nicotine dependence: Secondary | ICD-10-CM | POA: Insufficient documentation

## 2015-04-10 DIAGNOSIS — N644 Mastodynia: Secondary | ICD-10-CM | POA: Diagnosis not present

## 2015-04-10 DIAGNOSIS — R0602 Shortness of breath: Secondary | ICD-10-CM | POA: Insufficient documentation

## 2015-04-10 DIAGNOSIS — Z91040 Latex allergy status: Secondary | ICD-10-CM | POA: Diagnosis not present

## 2015-04-10 DIAGNOSIS — O4703 False labor before 37 completed weeks of gestation, third trimester: Secondary | ICD-10-CM | POA: Diagnosis not present

## 2015-04-10 DIAGNOSIS — F411 Generalized anxiety disorder: Secondary | ICD-10-CM | POA: Diagnosis not present

## 2015-04-10 DIAGNOSIS — Z3A32 32 weeks gestation of pregnancy: Secondary | ICD-10-CM | POA: Diagnosis not present

## 2015-04-10 DIAGNOSIS — R0789 Other chest pain: Secondary | ICD-10-CM | POA: Insufficient documentation

## 2015-04-10 DIAGNOSIS — O9229 Other disorders of breast associated with pregnancy and the puerperium: Secondary | ICD-10-CM

## 2015-04-10 HISTORY — DX: Anxiety disorder, unspecified: F41.9

## 2015-04-10 NOTE — MAU Note (Signed)
"  Feels like a small anxiety attack"

## 2015-04-10 NOTE — MAU Provider Note (Signed)
Chief Complaint:  Panic Attack   HPI: Brittany Williams is a 19 y.o. G1P0 at [redacted]w[redacted]d who presents to maternity admissions reporting chest pain and low abdominal cramping since this evening. Upon further questioning, she reports that she developed sharp pain inferior to her left breast and shortness of breath soon after receiving stressful news. States pain is similar to when she had an anxiety attack and before. The pain had resolved by the time of this exam. Also reports pain in her left breast and recent onset of leaking colostrum from breasts. Denies fever, chills, erythema or mass in breasts.  Location #1: Inferior to left breast Quality: Sharp Severity: 8/10 in pain scale at worst. None now Duration: 2 hours Context: Occurred after receiving stressful news Timing: Constant Modifying factors: Resolved spontaneously Associated signs and symptoms: Positive for shortness of breath. Negative for fever, chills, chest pressure, diaphoresis.  Location #2: Low abdomen Quality: Cramping Severity: 5/10 in pain scale Duration: Less than 24 hours Context: None Timing: Intermittent, "several times per day". Modifying factors: Improves with rest. Worse with ambulation. Associated signs and symptoms: Negative for fever, chills, urinary complaints, GI complaints, vaginal bleeding, vaginal discharge or leaking of fluid  Location #3: Left breast Quality: Sore Severity: 5/10 in pain scale Duration: Less than 24 hours Context: None Timing: Constant Modifying factors: Nothing. Hasn't tried anything for the pain Associated signs and symptoms: Negative for fever, chills, erythema, mass. Positive for new-onset of leaking colostrum.  Good fetal movement.   Pregnancy Course: Uncomplicated  Past Medical History: Past Medical History  Diagnosis Date  . Hypoglycemia   . Abnormal Pap smear   . Ovarian cyst   . Gonorrhea   . Chlamydia   . Asthma   . Anxiety     Past obstetric history: OB History   Gravida Para Term Preterm AB SAB TAB Ectopic Multiple Living  1             # Outcome Date GA Lbr Len/2nd Weight Sex Delivery Anes PTL Lv  1 Current               Past Surgical History: Past Surgical History  Procedure Laterality Date  . Appendectomy    . Cystectomy    . Wisdom tooth extraction       Family History: Family History  Problem Relation Age of Onset  . Other Neg Hx   . Hypertension Other   . Diabetes Other     Social History: Social History  Substance Use Topics  . Smoking status: Former Smoker -- 0.50 packs/day    Types: Cigarettes    Quit date: 09/23/2014  . Smokeless tobacco: Never Used  . Alcohol Use: No    Allergies:  Allergies  Allergen Reactions  . Latex Itching and Rash    Meds:  Prescriptions prior to admission  Medication Sig Dispense Refill Last Dose  . Prenat w/o A-FeCbGl-DSS-FA-DHA (CITRANATAL 90 DHA) 90-1 & 300 MG MISC Take 1 tablet by mouth daily. 60 each 6 Taking    I have reviewed patient's Past Medical Hx, Surgical Hx, Family Hx, Social Hx, medications and allergies.   ROS:  Review of Systems  Constitutional: Negative for fever, chills and diaphoresis.  Respiratory: Positive for shortness of breath. Negative for cough, chest tightness and wheezing.   Cardiovascular: Positive for chest pain. Negative for palpitations and leg swelling.  Gastrointestinal: Positive for abdominal pain. Negative for nausea, vomiting, diarrhea and constipation.  Genitourinary: Negative for dysuria, urgency, frequency, hematuria, flank  pain, vaginal bleeding, vaginal discharge and pelvic pain.  Musculoskeletal: Negative for back pain.       Right shoulder pain  Neurological: Negative for dizziness and weakness.  Psychiatric/Behavioral: The patient is nervous/anxious.     Physical Exam   Patient Vitals for the past 24 hrs:  BP Temp Temp src Pulse Resp SpO2 Height Weight  04/10/15 2355 - - - 89 - 96 % - -  04/10/15 2345 - - - 95 - 95 % - -   04/10/15 2330 126/75 mmHg - - 92 - 99 % - -  04/10/15 2325 - - - 102 - 99 % - -  04/10/15 2315 138/87 mmHg - - 98 - 100 % - -  04/10/15 2305 122/72 mmHg - - 90 - 99 % - -  04/10/15 2247 117/70 mmHg 97.8 F (36.6 C) Oral 97 17 99 % 5' (1.524 m) 173 lb 3.2 oz (78.563 kg)   Constitutional: Well-developed, well-nourished female in mild distress. Anxious. No diaphoresis or pallor. Cardiovascular: normal rate and rhythm. No murmurs rubs or gallops. Respiratory: normal effort. Clear to auscultation bilaterally. GI: Abd soft, non-tender, gravid appropriate for gestational age.  Breast: Mild tenderness bilaterally, left greater than right. No erythema, masses or lymphadenopathy. Normal breast changes for pregnancy noted. Scant dry colostrum noted on nipples. Bilateral nipple piercings without evidence of infection. MS: Extremities nontender, no edema, normal ROM Neurologic: Alert and oriented x 4.  GU: Neg CVAT.  Pelvic: NEFG, physiologic discharge, no blood, cervix clean. No CMT  Dilation: Closed Effacement (%): Thick Exam by:: [name] CNM  FHT:  Baseline 145 , moderate variability, accelerations present, no decelerations Contractions: None   Labs: No results found for this or any previous visit (from the past 24 hour(s)).  EKG: Normal sinus rhythm, possible left atrial enlargement per pulmonary report. Reviewed by cardiologist, Dr. Jacinto Halim. Normal EKG.  MAU Course: EKG, pelvic exam.  Patient very reassured with pelvic exam and EKG. Denies contractions or chest pain at present. Very concerned over stressful event today. Requesting letter from her lawyer stating that she was in the hospital tonight. Note given.  MDM: - 19 year old female at 39 weeks 2 days gestation with chest pain most likely related to anxiety. No evidence of a pulmonary emergency. - Breast pain and tenderness normal for this stage of pregnancy. No evidence of infection or abnormal masses. - Braxton Hicks contractions  without evidence of active preterm labor.  Assessment: 1. Preterm contractions, third trimester   2. Breast pain during pregnancy   3. Anxiety reaction     Plan: Discharge home in stable condition.  Chest pain precautions.  Preterm labor and fetal kick counts.     Follow-up Information    Follow up with [facility] On 04/13/2015.   Why:  Routine prenatal visit   Contact information:   [address] [place] [address] [phone]      Follow up with [facility] EMERGENCY DEPARTMENT.   Specialty:  Emergency Medicine   Why:  For chest pain or shortness of breath   Contact information:   [address] [id number] mc [place] [address] [phone]      Follow up with [facility] MATERNITY ADMISS[facility].   Why:  As needed in obstetric emergencies   Contact information:   [address] [id number] mc [place] [address] [phone]        Medication List    TAKE  these medications        CITRANATAL 90 DHA 90-1 & 300 MG Misc  Take 1 tablet by mouth daily.        [place] Kaytlyn Din, CNM 04/11/2015 12:52 AM

## 2015-04-10 NOTE — MAU Note (Signed)
Patient endorses going through "stress" this week; tonight c/o mid chest pain and right arm pain. Has not taken anything for the pain. Reporting abdominal cramping that started this evening; off and on. Denies LOF, VB at this time. +FM. Endorse shortness of breath; 99% o2 sats on room air.

## 2015-04-10 NOTE — MAU Note (Signed)
Patient asking for note here today for her lawyer.

## 2015-04-11 DIAGNOSIS — O4703 False labor before 37 completed weeks of gestation, third trimester: Secondary | ICD-10-CM

## 2015-04-11 NOTE — Discharge Instructions (Signed)
Braxton Hicks Contractions Contractions of the uterus can occur throughout pregnancy. Contractions are not always a sign that you are in labor.  WHAT ARE BRAXTON HICKS CONTRACTIONS?  Contractions that occur before labor are called Braxton Hicks contractions, or false labor. Toward the end of pregnancy (32-34 weeks), these contractions can develop more often and may become more forceful. This is not true labor because these contractions do not result in opening (dilatation) and thinning of the cervix. They are sometimes difficult to tell apart from true labor because these contractions can be forceful and people have different pain tolerances. You should not feel embarrassed if you go to the hospital with false labor. Sometimes, the only way to tell if you are in true labor is for your health care provider to look for changes in the cervix. If there are no prenatal problems or other health problems associated with the pregnancy, it is completely safe to be sent home with false labor and await the onset of true labor. HOW CAN YOU TELL THE DIFFERENCE BETWEEN TRUE AND FALSE LABOR? False Labor  The contractions of false labor are usually shorter and not as hard as those of true labor.   The contractions are usually irregular.   The contractions are often felt in the front of the lower abdomen and in the groin.   The contractions may go away when you walk around or change positions while lying down.   The contractions get weaker and are shorter lasting as time goes on.   The contractions do not usually become progressively stronger, regular, and closer together as with true labor.  True Labor  Contractions in true labor last 30-70 seconds, become very regular, usually become more intense, and increase in frequency.   The contractions do not go away with walking.   The discomfort is usually felt in the top of the uterus and spreads to the lower abdomen and low back.   True labor can be  determined by your health care provider with an exam. This will show that the cervix is dilating and getting thinner.  WHAT TO REMEMBER  Keep up with your usual exercises and follow other instructions given by your health care provider.   Take medicines as directed by your health care provider.   Keep your regular prenatal appointments.   Eat and drink lightly if you think you are going into labor.   If Braxton Hicks contractions are making you uncomfortable:   Change your position from lying down or resting to walking, or from walking to resting.   Sit and rest in a tub of warm water.   Drink 2-3 glasses of water. Dehydration may cause these contractions.   Do slow and deep breathing several times an hour.  WHEN SHOULD I SEEK IMMEDIATE MEDICAL CARE? Seek immediate medical care if:  Your contractions become stronger, more regular, and closer together.   You have fluid leaking or gushing from your vagina.   You have a fever.   You pass blood-tinged mucus.   You have vaginal bleeding.   You have continuous abdominal pain.   You have low back pain that you never had before.   You feel your baby's head pushing down and causing pelvic pressure.   Your baby is not moving as much as it used to.    This information is not intended to replace advice given to you by your health care provider. Make sure you discuss any questions you have with your health care  provider.   Document Released: 04/02/2005 Document Revised: 04/07/2013 Document Reviewed: 01/12/2013 Elsevier Interactive Patient Education 2016 [employer]   Nonspecific Chest Pain  Chest pain can be caused by many different conditions. There is always a chance that your pain could be related to something serious, such as a heart attack or a blood clot in your lungs. Chest pain can also be caused by conditions that are not life-threatening. If you have chest pain, it is very important to follow up with  your health care provider. CAUSES  Chest pain can be caused by:  Heartburn.  Pneumonia or bronchitis.  Anxiety or stress.  Inflammation around your heart (pericarditis) or lung (pleuritis or pleurisy).  A blood clot in your lung.  A collapsed lung (pneumothorax). It can develop suddenly on its own (spontaneous pneumothorax) or from trauma to the chest.  Shingles infection (varicella-zoster virus).  Heart attack.  Damage to the bones, muscles, and cartilage that make up your chest wall. This can include:  Bruised bones due to injury.  Strained muscles or cartilage due to frequent or repeated coughing or overwork.  Fracture to one or more ribs.  Sore cartilage due to inflammation (costochondritis). RISK FACTORS  Risk factors for chest pain may include:  Activities that increase your risk for trauma or injury to your chest.  Respiratory infections or conditions that cause frequent coughing.  Medical conditions or overeating that can cause heartburn.  Heart disease or family history of heart disease.  Conditions or health behaviors that increase your risk of developing a blood clot.  Having had chicken pox (varicella zoster). SIGNS AND SYMPTOMS Chest pain can feel like:  Burning or tingling on the surface of your chest or deep in your chest.  Crushing, pressure, aching, or squeezing pain.  Dull or sharp pain that is worse when you move, cough, or take a deep breath.  Pain that is also felt in your back, neck, shoulder, or arm, or pain that spreads to any of these areas. Your chest pain may come and go, or it may stay constant. DIAGNOSIS Lab tests or other studies may be needed to find the cause of your pain. Your health care provider may have you take a test called an ambulatory ECG (electrocardiogram). An ECG records your heartbeat patterns at the time the test is performed. You may also have other tests, such as:  Transthoracic echocardiogram (TTE). During  echocardiography, sound waves are used to create a picture of all of the heart structures and to look at how blood flows through your heart.  Transesophageal echocardiogram (TEE).This is a more advanced imaging test that obtains images from inside your body. It allows your health care provider to see your heart in finer detail.  Cardiac monitoring. This allows your health care provider to monitor your heart rate and rhythm in real time.  Holter monitor. This is a portable device that records your heartbeat and can help to diagnose abnormal heartbeats. It allows your health care provider to track your heart activity for several days, if needed.  Stress tests. These can be done through exercise or by taking medicine that makes your heart beat more quickly.  Blood tests.  Imaging tests. TREATMENT  Your treatment depends on what is causing your chest pain. Treatment may include:  Medicines. These may include:  Acid blockers for heartburn.  Anti-inflammatory medicine.  Pain medicine for inflammatory conditions.  Antibiotic medicine, if an infection is present.  Medicines to dissolve blood clots.  Medicines to  treat coronary artery disease.  Supportive care for conditions that do not require medicines. This may include:  Resting.  Applying heat or cold packs to injured areas.  Limiting activities until pain decreases. HOME CARE INSTRUCTIONS  If you were prescribed an antibiotic medicine, finish it all even if you start to feel better.  Avoid any activities that bring on chest pain.  Do not use any tobacco products, including cigarettes, chewing tobacco, or electronic cigarettes. If you need help quitting, ask your health care provider.  Do not drink alcohol.  Take medicines only as directed by your health care provider.  Keep all follow-up visits as directed by your health care provider. This is important. This includes any further testing if your chest pain does not go  away.  If heartburn is the cause for your chest pain, you may be told to keep your head raised (elevated) while sleeping. This reduces the chance that acid will go from your stomach into your esophagus.  Make lifestyle changes as directed by your health care provider. These may include:  Getting regular exercise. Ask your health care provider to suggest some activities that are safe for you.  Eating a heart-healthy diet. A registered dietitian can help you to learn healthy eating options.  Maintaining a healthy weight.  Managing diabetes, if necessary.  Reducing stress. SEEK MEDICAL CARE IF:  Your chest pain does not go away after treatment.  You have a rash with blisters on your chest.  You have a fever. SEEK IMMEDIATE MEDICAL CARE IF:   Your chest pain is worse.  You have an increasing cough, or you cough up blood.  You have severe abdominal pain.  You have severe weakness.  You faint.  You have chills.  You have sudden, unexplained chest discomfort.  You have sudden, unexplained discomfort in your arms, back, neck, or jaw.  You have shortness of breath at any time.  You suddenly start to sweat, or your skin gets clammy.  You feel nauseous or you vomit.  You suddenly feel light-headed or dizzy.  Your heart begins to beat quickly, or it feels like it is skipping beats. These symptoms may represent a serious problem that is an emergency. Do not wait to see if the symptoms will go away. Get medical help right away. Call your local emergency services (911 in the [place] Do not drive yourself to the hospital.   This information is not intended to replace advice given to you by your health care provider. Make sure you discuss any questions you have with your health care provider.   Document Released: 01/10/2005 Document Revised: 04/23/2014 Document Reviewed: 11/06/2013 Elsevier Interactive Patient Education [employer]

## 2015-04-13 ENCOUNTER — Ambulatory Visit (INDEPENDENT_AMBULATORY_CARE_PROVIDER_SITE_OTHER): Payer: Medicaid Other | Admitting: Certified Nurse Midwife

## 2015-04-13 VITALS — BP 109/71 | HR 96 | Temp 98.4°F | Wt 172.8 lb

## 2015-04-13 DIAGNOSIS — Z3403 Encounter for supervision of normal first pregnancy, third trimester: Secondary | ICD-10-CM

## 2015-04-13 DIAGNOSIS — O26813 Pregnancy related exhaustion and fatigue, third trimester: Secondary | ICD-10-CM

## 2015-04-13 DIAGNOSIS — K219 Gastro-esophageal reflux disease without esophagitis: Secondary | ICD-10-CM

## 2015-04-13 LAB — CBC WITH DIFFERENTIAL/PLATELET
BASOS ABS: 0 10*3/uL (ref 0.0–0.1)
BASOS PCT: 0 % (ref 0–1)
EOS ABS: 0.2 10*3/uL (ref 0.0–0.7)
Eosinophils Relative: 1 % (ref 0–5)
HCT: 37.4 % (ref 36.0–46.0)
HEMOGLOBIN: 12.5 g/dL (ref 12.0–15.0)
Lymphocytes Relative: 15 % (ref 12–46)
Lymphs Abs: 2.3 10*3/uL (ref 0.7–4.0)
MCH: 30 pg (ref 26.0–34.0)
MCHC: 33.4 g/dL (ref 30.0–36.0)
MCV: 89.7 fL (ref 78.0–100.0)
MONOS PCT: 10 % (ref 3–12)
MPV: 10.2 fL (ref 8.6–12.4)
Monocytes Absolute: 1.5 10*3/uL — ABNORMAL HIGH (ref 0.1–1.0)
NEUTROS ABS: 11.2 10*3/uL — AB (ref 1.7–7.7)
NEUTROS PCT: 74 % (ref 43–77)
PLATELETS: 234 10*3/uL (ref 150–400)
RBC: 4.17 MIL/uL (ref 3.87–5.11)
RDW: 13.7 % (ref 11.5–15.5)
WBC: 15.2 10*3/uL — AB (ref 4.0–10.5)

## 2015-04-13 LAB — POCT URINALYSIS DIPSTICK
BILIRUBIN UA: NEGATIVE
Glucose, UA: NEGATIVE
KETONES UA: NEGATIVE
LEUKOCYTES UA: NEGATIVE
Nitrite, UA: NEGATIVE
PH UA: 7
RBC UA: NEGATIVE
SPEC GRAV UA: 1.01
Urobilinogen, UA: NEGATIVE

## 2015-04-13 MED ORDER — OMEPRAZOLE 20 MG PO CPDR: 20.0000 mg | DELAYED_RELEASE_CAPSULE | Freq: Two times a day (BID) | ORAL | Status: DC

## 2015-04-13 NOTE — Progress Notes (Signed)
Patient was seen at the hospital 12/25 for pelvic pressure- patient was checked and released.

## 2015-04-13 NOTE — Progress Notes (Signed)
Subjective:    Brittany Williams is a 19 y.o. female being seen today for her obstetrical visit. She is at 10722w4d gestation. Patient reports backache, fatigue and heartburn. Fetal movement: normal.  Problem List Items Addressed This Visit    None    Visit Diagnoses    Prenatal care, third trimester    -  Primary    Relevant Orders    POCT urinalysis dipstick      Patient Active Problem List   Diagnosis Date Noted  . Encounter for supervision of other normal pregnancy in first trimester 11/02/2014   Objective:    BP 109/71 mmHg  Pulse 96  Temp(Src) 98.4 F (36.9 C)  Wt 172 lb 12.8 oz (78.382 kg)  LMP 08/28/2014 FHT:  150 BPM  Uterine Size: size equals dates  Presentation: cephalic     Assessment:    Pregnancy @ 422w4d weeks   Fatigue  GERD  Plan:   desires waterbirth   labs reviewed, problem list updated Consent signed. GBS planning TDAP offered  Rhogam given for RH negative Pediatrician: discussed. Infant feeding: plans to breastfeed. Maternity leave: discussed. Cigarette smoking: never smoked. Orders Placed This Encounter  Procedures  . POCT urinalysis dipstick   No orders of the defined types were placed in this encounter.   Follow up in 2 Weeks.

## 2015-04-17 NOTE — L&D Delivery Note (Signed)
Delivery Note This is a 20 year old G 1 P0 who was admitted for Active phase labor. She progressed normally with Fentanyl to the second stage of labor.  She pushed for 45 min.  At 2:49 AM she delivered a viable infant female, cephalic.  A nuchal cord   was not identified. Infant placed on maternal abdomen.  Delayed cord clamping was performed for 15 minutes.  Cord double clamped and cut.  Apgar scores were 9 and 9. The placenta delivered spontaneously, shultz, with a 3 vessel cord.  Inspection revealed 1st degree. The uterus was firm bleeding stable.  The repair was not required.   EBL was 100.    Placenta and umbilical artery blood gas were not sent.  There were no complications during the procedure.  Mom and baby skin to skin following delivery. Left in stable condition.   Vaginal, Spontaneous Delivery (Presentation: ; Occiput Anterior).  APGAR: 9, 9; weight  .   Placenta status: Shultz, intact.  Cord:  with the following complications: none.  Cord pH: N/A  Anesthesia: None  Episiotomy: None Lacerations: 1st degree Suture Repair: none Est. Blood Loss (mL):  100  Mom to postpartum.  Baby to Couplet care / Skin to Skin.  Roe Coombs, CNM 05/23/2015, 3:22 AM

## 2015-04-20 ENCOUNTER — Telehealth: Payer: Self-pay | Admitting: *Deleted

## 2015-04-20 ENCOUNTER — Encounter: Payer: Self-pay | Admitting: *Deleted

## 2015-04-20 NOTE — Telephone Encounter (Signed)
Patient is requesting a letter for her community service. She is having problems standing for prolonged periods of time and she states she starts sweating and gets dizzy. Told patient we could request a postponement until after her delivery.

## 2015-04-27 ENCOUNTER — Ambulatory Visit (INDEPENDENT_AMBULATORY_CARE_PROVIDER_SITE_OTHER): Payer: Medicaid Other | Admitting: Obstetrics

## 2015-04-27 ENCOUNTER — Encounter: Payer: Self-pay | Admitting: Obstetrics

## 2015-04-27 VITALS — BP 117/75 | HR 125 | Temp 97.7°F | Wt 177.0 lb

## 2015-04-27 DIAGNOSIS — Z3403 Encounter for supervision of normal first pregnancy, third trimester: Secondary | ICD-10-CM

## 2015-04-27 LAB — POCT URINALYSIS DIPSTICK
BILIRUBIN UA: NEGATIVE
Glucose, UA: NEGATIVE
Ketones, UA: NEGATIVE
Leukocytes, UA: NEGATIVE
NITRITE UA: NEGATIVE
PH UA: 7
RBC UA: NEGATIVE
Spec Grav, UA: 1.01
UROBILINOGEN UA: NEGATIVE

## 2015-04-27 NOTE — Progress Notes (Signed)
Subjective:    Brittany Williams is a 20 y.o. female being seen today for her obstetrical visit. She is at 6744w4d gestation. Patient reports no complaints. Fetal movement: normal.  Problem List Items Addressed This Visit    None    Visit Diagnoses    Encounter for supervision of normal first pregnancy in third trimester    -  Primary    Relevant Orders    POCT urinalysis dipstick (Completed)      Patient Active Problem List   Diagnosis Date Noted  . Encounter for supervision of other normal pregnancy in first trimester 11/02/2014   Objective:    BP 117/75 mmHg  Pulse 125  Temp(Src) 97.7 F (36.5 C)  Wt 177 lb (80.287 kg)  LMP 08/28/2014 FHT:  150 BPM  Uterine Size: size equals dates  Presentation: unsure     Assessment:    Pregnancy @ 6244w4d weeks   Plan:     labs reviewed, problem list updated Consent signed. GBS sent TDAP offered  Rhogam given for RH negative Pediatrician: discussed. Infant feeding: plans to breastfeed. Maternity leave: discussed. Cigarette smoking: former smoker. Orders Placed This Encounter  Procedures  . POCT urinalysis dipstick   No orders of the defined types were placed in this encounter.   Follow up in 1 Week.

## 2015-05-05 ENCOUNTER — Ambulatory Visit (INDEPENDENT_AMBULATORY_CARE_PROVIDER_SITE_OTHER): Payer: Medicaid Other | Admitting: Certified Nurse Midwife

## 2015-05-05 VITALS — BP 119/76 | HR 107 | Temp 98.0°F | Wt 181.0 lb

## 2015-05-05 DIAGNOSIS — Z3403 Encounter for supervision of normal first pregnancy, third trimester: Secondary | ICD-10-CM

## 2015-05-05 LAB — POCT URINALYSIS DIPSTICK
Bilirubin, UA: NEGATIVE
Glucose, UA: NEGATIVE
KETONES UA: NEGATIVE
LEUKOCYTES UA: NEGATIVE
NITRITE UA: NEGATIVE
PH UA: 8
PROTEIN UA: NEGATIVE
RBC UA: NEGATIVE
Spec Grav, UA: 1.01
Urobilinogen, UA: NEGATIVE

## 2015-05-05 MED ORDER — DOCUSATE SODIUM 100 MG PO CAPS: 100.0000 mg | ORAL_CAPSULE | Freq: Two times a day (BID) | ORAL | Status: DC

## 2015-05-05 NOTE — Progress Notes (Signed)
Subjective:    Brittany Williams is a 20 y.o. female being seen today for her obstetrical visit. She is at [redacted]w[redacted]d gestation. Patient reports backache, no bleeding, no contractions, no cramping and no leaking. Fetal movement: normal.  Problem List Items Addressed This Visit    None     Patient Active Problem List   Diagnosis Date Noted  . Encounter for supervision of other normal pregnancy in first trimester 11/02/2014   Objective:    BP 119/76 mmHg  Pulse 107  Temp(Src) 98 F (36.7 C)  Wt 181 lb (82.101 kg)  LMP 08/28/2014 FHT:  145 BPM  Uterine Size: size equals dates  Presentation: cephalic   Cervix: long, thick, closed and posterior  Assessment:    Pregnancy @ [redacted]w[redacted]d weeks   Doing well.   Water birth planned.   Plan:     labs reviewed, problem list updated Consent signed. GBS sent TDAP offered  Rhogam given for RH negative Pediatrician: discussed. Infant feeding: plans to breastfeed. Maternity leave: N/A. Cigarette smoking: quit at start of pregnancy. No orders of the defined types were placed in this encounter.   No orders of the defined types were placed in this encounter.   Follow up in 1 Week.

## 2015-05-06 LAB — STREP B DNA PROBE: GBSP: NOT DETECTED

## 2015-05-11 ENCOUNTER — Ambulatory Visit (INDEPENDENT_AMBULATORY_CARE_PROVIDER_SITE_OTHER): Payer: Medicaid Other | Admitting: Certified Nurse Midwife

## 2015-05-11 ENCOUNTER — Ambulatory Visit: Payer: Medicaid Other | Admitting: Certified Nurse Midwife

## 2015-05-11 VITALS — BP 123/79 | HR 108 | Temp 97.6°F | Wt 182.0 lb

## 2015-05-11 DIAGNOSIS — O479 False labor, unspecified: Secondary | ICD-10-CM

## 2015-05-11 LAB — POCT URINALYSIS DIPSTICK
Bilirubin, UA: NEGATIVE
Blood, UA: NEGATIVE
GLUCOSE UA: NEGATIVE
KETONES UA: NEGATIVE
LEUKOCYTES UA: NEGATIVE
Nitrite, UA: NEGATIVE
PROTEIN UA: NEGATIVE
Urobilinogen, UA: NEGATIVE
pH, UA: 6.5

## 2015-05-11 NOTE — Progress Notes (Signed)
Subjective:    Brittany Williams is a 20 y.o. female being seen today for her obstetrical visit. She is at [redacted]w[redacted]d gestation. Patient reports backache, heartburn, no bleeding, no leaking, occasional contractions and lower pelvic pressure, states contractions come and go. Fetal movement: normal.  Problem List Items Addressed This Visit    None    Visit Diagnoses    Irregular uterine contractions    -  Primary    Relevant Orders    POCT urinalysis dipstick (Completed)    Fetal non-stress test      Patient Active Problem List   Diagnosis Date Noted  . Encounter for supervision of other normal pregnancy in first trimester 11/02/2014   Objective:    BP 123/79 mmHg  Pulse 108  Temp(Src) 97.6 F (36.4 C)  Wt 182 lb (82.555 kg)  LMP 08/28/2014 FHT:  150 BPM  Uterine Size: size equals dates  Presentation: cephalic   cervix: long, thick, closed and very posterior  NST: + accels, no decels, no contractions, +FM, Moderate variability, Cat. 1 tracing Assessment:    Pregnancy @ [redacted]w[redacted]d weeks   Discomforts of late pregnancy  GERD  Reactive NST  Plan:     labs reviewed, problem list updated Consent signed. GBS results reviewed.  TDAP offered  Rhogam given for RH negative Pediatrician: discussed. Infant feeding: plans to breastfeed. Maternity leave: discussed. Cigarette smoking: never smoked. Orders Placed This Encounter  Procedures  . Fetal non-stress test    Standing Status: Standing     Number of Occurrences: 1     Standing Expiration Date:   . POCT urinalysis dipstick   No orders of the defined types were placed in this encounter.   Follow up in 1 Week.

## 2015-05-11 NOTE — Progress Notes (Signed)
Patient has had increasing contractions and pressure

## 2015-05-18 ENCOUNTER — Ambulatory Visit (INDEPENDENT_AMBULATORY_CARE_PROVIDER_SITE_OTHER): Payer: Medicaid Other | Admitting: Certified Nurse Midwife

## 2015-05-18 VITALS — BP 118/76 | HR 107 | Temp 97.3°F | Wt 186.0 lb

## 2015-05-18 DIAGNOSIS — Z3403 Encounter for supervision of normal first pregnancy, third trimester: Secondary | ICD-10-CM

## 2015-05-18 NOTE — Progress Notes (Addendum)
Subjective:    Brittany Williams is a 20 y.o. female being seen today for her obstetrical visit. She is at [redacted]w[redacted]d gestation. Patient reports no complaints. Fetal movement: normal.  Problem List Items Addressed This Visit    None     Patient Active Problem List   Diagnosis Date Noted  . Encounter for supervision of other normal pregnancy in first trimester 11/02/2014    Objective:    BP 118/76 mmHg  Pulse 107  Temp(Src) 97.3 F (36.3 C)  Wt 186 lb (84.369 kg)  LMP 08/28/2014 FHT: 145 BPM  Uterine Size: size equals dates  Presentations: cephalic  Pelvic Exam: deferred     Assessment:    Pregnancy @ [redacted]w[redacted]d weeks   Doing well  Water birth planned Plan:   Plans for delivery: Vaginal anticipated; labs reviewed; problem list updated Counseling: Consent signed. Infant feeding: plans to breastfeed. Cigarette smoking: never smoked. L&D discussion: symptoms of labor, discussed when to call, discussed what number to call, anesthetic/analgesic options reviewed and delivering clinician:  plans Certified Nurse-Midwife. Postpartum supports and preparation: circumcision discussed and contraception plans discussed.  Follow up in 1 Week.

## 2015-05-21 ENCOUNTER — Encounter (HOSPITAL_COMMUNITY): Payer: Self-pay

## 2015-05-21 ENCOUNTER — Inpatient Hospital Stay (HOSPITAL_COMMUNITY)
Admission: AD | Admit: 2015-05-21 | Discharge: 2015-05-21 | Disposition: A | Discharge status: Home / Self Care | Payer: Medicaid Other | Source: Ambulatory Visit | Attending: Obstetrics | Admitting: Obstetrics

## 2015-05-21 DIAGNOSIS — Z3493 Encounter for supervision of normal pregnancy, unspecified, third trimester: Secondary | ICD-10-CM | POA: Diagnosis not present

## 2015-05-21 NOTE — MAU Note (Signed)
Pt c/o contractions starting around 5am around 5 minutes apart. Pt denies bleeding and leaking of fluid.

## 2015-05-22 ENCOUNTER — Inpatient Hospital Stay (HOSPITAL_COMMUNITY)
Admission: AD | Admit: 2015-05-22 | Discharge: 2015-05-22 | Disposition: A | Discharge status: Home / Self Care | Payer: Medicaid Other | Source: Ambulatory Visit | Attending: Obstetrics | Admitting: Obstetrics

## 2015-05-22 MED ORDER — MEPERIDINE HCL 25 MG/ML IJ SOLN
25.0000 mg | Freq: Once | INTRAMUSCULAR | Status: AC
  Administered 2015-05-22: 25 mg via INTRAMUSCULAR
  Filled 2015-05-22: qty 1

## 2015-05-22 MED ORDER — PROMETHAZINE HCL 25 MG/ML IJ SOLN
25.0000 mg | Freq: Once | INTRAMUSCULAR | Status: AC
  Administered 2015-05-22: 25 mg via INTRAVENOUS
  Filled 2015-05-22: qty 1

## 2015-05-22 NOTE — Discharge Instructions (Signed)
Braxton Hicks Contractions °Contractions of the uterus can occur throughout pregnancy. Contractions are not always a sign that you are in labor.  °WHAT ARE BRAXTON HICKS CONTRACTIONS?  °Contractions that occur before labor are called Braxton Hicks contractions, or false labor. Toward the end of pregnancy (32-34 weeks), these contractions can develop more often and may become more forceful. This is not true labor because these contractions do not result in opening (dilatation) and thinning of the cervix. They are sometimes difficult to tell apart from true labor because these contractions can be forceful and people have different pain tolerances. You should not feel embarrassed if you go to the hospital with false labor. Sometimes, the only way to tell if you are in true labor is for your health care provider to look for changes in the cervix. °If there are no prenatal problems or other health problems associated with the pregnancy, it is completely safe to be sent home with false labor and await the onset of true labor. °HOW CAN YOU TELL THE DIFFERENCE BETWEEN TRUE AND FALSE LABOR? °False Labor °· The contractions of false labor are usually shorter and not as hard as those of true labor.   °· The contractions are usually irregular.   °· The contractions are often felt in the front of the lower abdomen and in the groin.   °· The contractions may go away when you walk around or change positions while lying down.   °· The contractions get weaker and are shorter lasting as time goes on.   °· The contractions do not usually become progressively stronger, regular, and closer together as with true labor.   °True Labor °1. Contractions in true labor last 30-70 seconds, become very regular, usually become more intense, and increase in frequency.   °2. The contractions do not go away with walking.   °3. The discomfort is usually felt in the top of the uterus and spreads to the lower abdomen and low back.   °4. True labor can  be determined by your health care provider with an exam. This will show that the cervix is dilating and getting thinner.   °WHAT TO REMEMBER °· Keep up with your usual exercises and follow other instructions given by your health care provider.   °· Take medicines as directed by your health care provider.   °· Keep your regular prenatal appointments.   °· Eat and drink lightly if you think you are going into labor.   °· If Braxton Hicks contractions are making you uncomfortable:   °· Change your position from lying down or resting to walking, or from walking to resting.   °· Sit and rest in a tub of warm water.   °· Drink 2-3 glasses of water. Dehydration may cause these contractions.   °· Do slow and deep breathing several times an hour.   °WHEN SHOULD I SEEK IMMEDIATE MEDICAL CARE? °Seek immediate medical care if: °· Your contractions become stronger, more regular, and closer together.   °· You have fluid leaking or gushing from your vagina.   °· You have a fever.   °· You pass blood-tinged mucus.   °· You have vaginal bleeding.   °· You have continuous abdominal pain.   °· You have low back pain that you never had before.   °· You feel your baby's head pushing down and causing pelvic pressure.   °· Your baby is not moving as much as it used to.   °  °This information is not intended to replace advice given to you by your health care provider. Make sure you discuss any questions you have with your health care   provider. °  °Document Released: 04/02/2005 Document Revised: 04/07/2013 Document Reviewed: 01/12/2013 °Elsevier Interactive Patient Education ©2016 [employer] ° °Fetal Movement Counts °Patient Name: __________________________________________________ Patient Due Date: ____________________ °Performing a fetal movement count is highly recommended in high-risk pregnancies, but it is good for every pregnant woman to do. Your health care provider may ask you to start counting fetal movements at 28 weeks of the  pregnancy. Fetal movements often increase: °· After eating a full meal. °· After physical activity. °· After eating or drinking something sweet or cold. °· At rest. °Pay attention to when you feel the baby is most active. This will help you notice a pattern of your baby's sleep and wake cycles and what factors contribute to an increase in fetal movement. It is important to perform a fetal movement count at the same time each day when your baby is normally most active.  °HOW TO COUNT FETAL MOVEMENTS °5. Find a quiet and comfortable area to sit or lie down on your left side. Lying on your left side provides the best blood and oxygen circulation to your baby. °6. Write down the day and time on a sheet of paper or in a journal. °7. Start counting kicks, flutters, swishes, rolls, or jabs in a 2-hour period. You should feel at least 10 movements within 2 hours. °8. If you do not feel 10 movements in 2 hours, wait 2-3 hours and count again. Look for a change in the pattern or not enough counts in 2 hours. °SEEK MEDICAL CARE IF: °· You feel less than 10 counts in 2 hours, tried twice. °· There is no movement in over an hour. °· The pattern is changing or taking longer each day to reach 10 counts in 2 hours. °· You feel the baby is not moving as he or she usually does. °Date: ____________ Movements: ____________ Start time: ____________ Finish time: ____________  °Date: ____________ Movements: ____________ Start time: ____________ Finish time: ____________ °Date: ____________ Movements: ____________ Start time: ____________ Finish time: ____________ °Date: ____________ Movements: ____________ Start time: ____________ Finish time: ____________ °Date: ____________ Movements: ____________ Start time: ____________ Finish time: ____________ °Date: ____________ Movements: ____________ Start time: ____________ Finish time: ____________ °Date: ____________ Movements: ____________ Start time: ____________ Finish time:  ____________ °Date: ____________ Movements: ____________ Start time: ____________ Finish time: ____________  °Date: ____________ Movements: ____________ Start time: ____________ Finish time: ____________ °Date: ____________ Movements: ____________ Start time: ____________ Finish time: ____________ °Date: ____________ Movements: ____________ Start time: ____________ Finish time: ____________ °Date: ____________ Movements: ____________ Start time: ____________ Finish time: ____________ °Date: ____________ Movements: ____________ Start time: ____________ Finish time: ____________ °Date: ____________ Movements: ____________ Start time: ____________ Finish time: ____________ °Date: ____________ Movements: ____________ Start time: ____________ Finish time: ____________  °Date: ____________ Movements: ____________ Start time: ____________ Finish time: ____________ °Date: ____________ Movements: ____________ Start time: ____________ Finish time: ____________ °Date: ____________ Movements: ____________ Start time: ____________ Finish time: ____________ °Date: ____________ Movements: ____________ Start time: ____________ Finish time: ____________ °Date: ____________ Movements: ____________ Start time: ____________ Finish time: ____________ °Date: ____________ Movements: ____________ Start time: ____________ Finish time: ____________ °Date: ____________ Movements: ____________ Start time: ____________ Finish time: ____________  °Date: ____________ Movements: ____________ Start time: ____________ Finish time: ____________ °Date: ____________ Movements: ____________ Start time: ____________ Finish time: ____________ °Date: ____________ Movements: ____________ Start time: ____________ Finish time: ____________ °Date: ____________ Movements: ____________ Start time: ____________ Finish time: ____________ °Date: ____________ Movements: ____________ Start time: ____________ Finish time: ____________ °Date: ____________ Movements:  ____________ Start time: ____________ Finish   time: ____________ °Date: ____________ Movements: ____________ Start time: ____________ Finish time: ____________  °Date: ____________ Movements: ____________ Start time: ____________ Finish time: ____________ °Date: ____________ Movements: ____________ Start time: ____________ Finish time: ____________ °Date: ____________ Movements: ____________ Start time: ____________ Finish time: ____________ °Date: ____________ Movements: ____________ Start time: ____________ Finish time: ____________ °Date: ____________ Movements: ____________ Start time: ____________ Finish time: ____________ °Date: ____________ Movements: ____________ Start time: ____________ Finish time: ____________ °Date: ____________ Movements: ____________ Start time: ____________ Finish time: ____________  °Date: ____________ Movements: ____________ Start time: ____________ Finish time: ____________ °Date: ____________ Movements: ____________ Start time: ____________ Finish time: ____________ °Date: ____________ Movements: ____________ Start time: ____________ Finish time: ____________ °Date: ____________ Movements: ____________ Start time: ____________ Finish time: ____________ °Date: ____________ Movements: ____________ Start time: ____________ Finish time: ____________ °Date: ____________ Movements: ____________ Start time: ____________ Finish time: ____________ °Date: ____________ Movements: ____________ Start time: ____________ Finish time: ____________  °Date: ____________ Movements: ____________ Start time: ____________ Finish time: ____________ °Date: ____________ Movements: ____________ Start time: ____________ Finish time: ____________ °Date: ____________ Movements: ____________ Start time: ____________ Finish time: ____________ °Date: ____________ Movements: ____________ Start time: ____________ Finish time: ____________ °Date: ____________ Movements: ____________ Start time: ____________ Finish  time: ____________ °Date: ____________ Movements: ____________ Start time: ____________ Finish time: ____________ °Date: ____________ Movements: ____________ Start time: ____________ Finish time: ____________  °Date: ____________ Movements: ____________ Start time: ____________ Finish time: ____________ °Date: ____________ Movements: ____________ Start time: ____________ Finish time: ____________ °Date: ____________ Movements: ____________ Start time: ____________ Finish time: ____________ °Date: ____________ Movements: ____________ Start time: ____________ Finish time: ____________ °Date: ____________ Movements: ____________ Start time: ____________ Finish time: ____________ °Date: ____________ Movements: ____________ Start time: ____________ Finish time: ____________ °  °This information is not intended to replace advice given to you by your health care provider. Make sure you discuss any questions you have with your health care provider. °  °Document Released: 05/02/2006 Document Revised: 04/23/2014 Document Reviewed: 01/28/2012 °Elsevier Interactive Patient Education ©2016 Elsevier Inc. ° °

## 2015-05-22 NOTE — MAU Note (Signed)
Pt presents complaining of contractions every 3-5 minutes with bloody show. Denies leaking of fluid.

## 2015-05-23 ENCOUNTER — Encounter (HOSPITAL_COMMUNITY): Payer: Self-pay | Admitting: *Deleted

## 2015-05-23 ENCOUNTER — Inpatient Hospital Stay (HOSPITAL_COMMUNITY)
Admission: AD | Admit: 2015-05-23 | Discharge: 2015-05-24 | DRG: 775 | Disposition: A | Discharge status: Home / Self Care | Payer: Medicaid Other | Source: Ambulatory Visit | Attending: Obstetrics | Admitting: Obstetrics

## 2015-05-23 DIAGNOSIS — Z87891 Personal history of nicotine dependence: Secondary | ICD-10-CM

## 2015-05-23 DIAGNOSIS — Z8249 Family history of ischemic heart disease and other diseases of the circulatory system: Secondary | ICD-10-CM | POA: Diagnosis not present

## 2015-05-23 DIAGNOSIS — Z3A38 38 weeks gestation of pregnancy: Secondary | ICD-10-CM

## 2015-05-23 DIAGNOSIS — Z833 Family history of diabetes mellitus: Secondary | ICD-10-CM | POA: Diagnosis not present

## 2015-05-23 DIAGNOSIS — Z3490 Encounter for supervision of normal pregnancy, unspecified, unspecified trimester: Secondary | ICD-10-CM

## 2015-05-23 LAB — CBC
HCT: 36.8 % (ref 36.0–46.0)
HCT: 33.3 % — ABNORMAL LOW (ref 36.0–46.0)
HEMOGLOBIN: 11.3 g/dL — AB (ref 12.0–15.0)
Hemoglobin: 12.6 g/dL (ref 12.0–15.0)
MCH: 29.4 pg (ref 26.0–34.0)
MCH: 29.4 pg (ref 26.0–34.0)
MCHC: 34.2 g/dL (ref 30.0–36.0)
MCHC: 33.9 g/dL (ref 30.0–36.0)
MCV: 85.8 fL (ref 78.0–100.0)
MCV: 86.7 fL (ref 78.0–100.0)
PLATELETS: 231 10*3/uL (ref 150–400)
PLATELETS: 213 10*3/uL (ref 150–400)
RBC: 4.29 MIL/uL (ref 3.87–5.11)
RBC: 3.84 MIL/uL — AB (ref 3.87–5.11)
RDW: 15.6 % — AB (ref 11.5–15.5)
RDW: 16 % — ABNORMAL HIGH (ref 11.5–15.5)
WBC: 27.5 10*3/uL — ABNORMAL HIGH (ref 4.0–10.5)
WBC: 25 10*3/uL — ABNORMAL HIGH (ref 4.0–10.5)

## 2015-05-23 LAB — ABO/RH: ABO/RH(D): O POS

## 2015-05-23 LAB — RPR: RPR Ser Ql: NONREACTIVE

## 2015-05-23 LAB — TYPE AND SCREEN
ABO/RH(D): O POS
Antibody Screen: NEGATIVE

## 2015-05-23 MED ORDER — OXYTOCIN 10 UNIT/ML IJ SOLN
2.5000 [IU]/h | INTRAVENOUS | Status: DC
  Administered 2015-05-23: 2.5 [IU]/h via INTRAVENOUS

## 2015-05-23 MED ORDER — LANOLIN HYDROUS EX OINT: TOPICAL_OINTMENT | CUTANEOUS | Status: DC | PRN

## 2015-05-23 MED ORDER — PHENYLEPHRINE 40 MCG/ML (10ML) SYRINGE FOR IV PUSH (FOR BLOOD PRESSURE SUPPORT)
PREFILLED_SYRINGE | INTRAVENOUS | Status: AC
  Filled 2015-05-23: qty 20

## 2015-05-23 MED ORDER — ACETAMINOPHEN 325 MG PO TABS: 650.0000 mg | ORAL_TABLET | ORAL | Status: DC | PRN

## 2015-05-23 MED ORDER — OXYTOCIN 10 UNIT/ML IJ SOLN: 2.5000 [IU]/h | INTRAVENOUS | Status: DC | PRN

## 2015-05-23 MED ORDER — ONDANSETRON HCL 4 MG/2ML IJ SOLN: 4.0000 mg | Freq: Four times a day (QID) | INTRAMUSCULAR | Status: DC | PRN

## 2015-05-23 MED ORDER — OXYTOCIN 10 UNIT/ML IJ SOLN
INTRAVENOUS | Status: AC
  Filled 2015-05-23: qty 4

## 2015-05-23 MED ORDER — FLEET ENEMA 7-19 GM/118ML RE ENEM: 1.0000 | ENEMA | Freq: Every day | RECTAL | Status: DC | PRN

## 2015-05-23 MED ORDER — OXYTOCIN BOLUS FROM INFUSION: 500.0000 mL | INTRAVENOUS | Status: DC

## 2015-05-23 MED ORDER — OXYCODONE-ACETAMINOPHEN 5-325 MG PO TABS: 1.0000 | ORAL_TABLET | ORAL | Status: DC | PRN

## 2015-05-23 MED ORDER — BENZOCAINE-MENTHOL 20-0.5 % EX AERO
1.0000 "application " | INHALATION_SPRAY | CUTANEOUS | Status: DC | PRN
  Administered 2015-05-23 – 2015-05-24 (×2): 1 via TOPICAL
  Filled 2015-05-23 (×2): qty 56

## 2015-05-23 MED ORDER — FENTANYL 2.5 MCG/ML BUPIVACAINE 1/10 % EPIDURAL INFUSION (WH - ANES): 14.0000 mL/h | INTRAMUSCULAR | Status: DC | PRN

## 2015-05-23 MED ORDER — LACTATED RINGERS IV SOLN: 500.0000 mL | INTRAVENOUS | Status: DC | PRN

## 2015-05-23 MED ORDER — ONDANSETRON HCL 4 MG/2ML IJ SOLN: 4.0000 mg | INTRAMUSCULAR | Status: DC | PRN

## 2015-05-23 MED ORDER — EPHEDRINE 5 MG/ML INJ
10.0000 mg | INTRAVENOUS | Status: DC | PRN
  Filled 2015-05-23: qty 2

## 2015-05-23 MED ORDER — DIPHENHYDRAMINE HCL 25 MG PO CAPS: 25.0000 mg | ORAL_CAPSULE | Freq: Four times a day (QID) | ORAL | Status: DC | PRN

## 2015-05-23 MED ORDER — OXYCODONE-ACETAMINOPHEN 5-325 MG PO TABS: 2.0000 | ORAL_TABLET | ORAL | Status: DC | PRN

## 2015-05-23 MED ORDER — METHYLERGONOVINE MALEATE 0.2 MG/ML IJ SOLN: 0.2000 mg | INTRAMUSCULAR | Status: DC | PRN

## 2015-05-23 MED ORDER — CITRIC ACID-SODIUM CITRATE 334-500 MG/5ML PO SOLN: 30.0000 mL | ORAL | Status: DC | PRN

## 2015-05-23 MED ORDER — CITRANATAL 90 DHA 90-1 & 300 MG PO MISC: 1.0000 | Freq: Every day | ORAL | Status: DC

## 2015-05-23 MED ORDER — FENTANYL CITRATE (PF) 100 MCG/2ML IJ SOLN
100.0000 ug | INTRAMUSCULAR | Status: DC | PRN
  Administered 2015-05-23: 100 ug via INTRAVENOUS

## 2015-05-23 MED ORDER — TETANUS-DIPHTH-ACELL PERTUSSIS 5-2.5-18.5 LF-MCG/0.5 IM SUSP
0.5000 mL | Freq: Once | INTRAMUSCULAR | Status: AC
  Administered 2015-05-24: 0.5 mL via INTRAMUSCULAR
  Filled 2015-05-23: qty 0.5

## 2015-05-23 MED ORDER — FENTANYL 2.5 MCG/ML BUPIVACAINE 1/10 % EPIDURAL INFUSION (WH - ANES)
INTRAMUSCULAR | Status: DC
Start: 2015-05-23 — End: 2015-05-23
  Filled 2015-05-23: qty 125

## 2015-05-23 MED ORDER — CALCIUM CARBONATE ANTACID 500 MG PO CHEW
1.0000 | CHEWABLE_TABLET | Freq: Three times a day (TID) | ORAL | Status: DC | PRN
  Administered 2015-05-23: 200 mg via ORAL
  Filled 2015-05-23: qty 1

## 2015-05-23 MED ORDER — PRENATAL MULTIVITAMIN CH
1.0000 | ORAL_TABLET | Freq: Every day | ORAL | Status: DC
  Administered 2015-05-23 – 2015-05-24 (×2): 1 via ORAL
  Filled 2015-05-23 (×2): qty 1

## 2015-05-23 MED ORDER — NALOXONE HCL 0.4 MG/ML IJ SOLN
INTRAMUSCULAR | Status: AC
  Filled 2015-05-23: qty 1

## 2015-05-23 MED ORDER — LACTATED RINGERS IV SOLN
INTRAVENOUS | Status: DC
  Administered 2015-05-23: 01:00:00 via INTRAVENOUS

## 2015-05-23 MED ORDER — LIDOCAINE HCL (PF) 1 % IJ SOLN: 30.0000 mL | INTRAMUSCULAR | Status: DC | PRN

## 2015-05-23 MED ORDER — LIDOCAINE HCL (PF) 1 % IJ SOLN
INTRAMUSCULAR | Status: AC
  Filled 2015-05-23: qty 30

## 2015-05-23 MED ORDER — DIPHENHYDRAMINE HCL 50 MG/ML IJ SOLN
12.5000 mg | INTRAMUSCULAR | Status: DC | PRN
Start: 2015-05-23 — End: 2015-05-23

## 2015-05-23 MED ORDER — SIMETHICONE 80 MG PO CHEW: 80.0000 mg | CHEWABLE_TABLET | ORAL | Status: DC | PRN

## 2015-05-23 MED ORDER — MEDROXYPROGESTERONE ACETATE 150 MG/ML IM SUSP: 150.0000 mg | INTRAMUSCULAR | Status: DC | PRN

## 2015-05-23 MED ORDER — FENTANYL CITRATE (PF) 100 MCG/2ML IJ SOLN
INTRAMUSCULAR | Status: AC
  Filled 2015-05-23: qty 2

## 2015-05-23 MED ORDER — PHENYLEPHRINE 40 MCG/ML (10ML) SYRINGE FOR IV PUSH (FOR BLOOD PRESSURE SUPPORT)
80.0000 ug | PREFILLED_SYRINGE | INTRAVENOUS | Status: DC | PRN
Start: 2015-05-23 — End: 2015-05-23
  Filled 2015-05-23: qty 2

## 2015-05-23 MED ORDER — EPHEDRINE 5 MG/ML INJ: 10.0000 mg | INTRAVENOUS | Status: DC | PRN

## 2015-05-23 MED ORDER — ACETAMINOPHEN 325 MG PO TABS
650.0000 mg | ORAL_TABLET | ORAL | Status: DC | PRN
  Administered 2015-05-23: 650 mg via ORAL
  Filled 2015-05-23: qty 2

## 2015-05-23 MED ORDER — SENNOSIDES-DOCUSATE SODIUM 8.6-50 MG PO TABS
2.0000 | ORAL_TABLET | ORAL | Status: DC
  Administered 2015-05-23: 2 via ORAL
  Filled 2015-05-23: qty 2

## 2015-05-23 MED ORDER — OXYCODONE-ACETAMINOPHEN 5-325 MG PO TABS
2.0000 | ORAL_TABLET | ORAL | Status: DC | PRN
  Administered 2015-05-23: 1 via ORAL
  Filled 2015-05-23: qty 2

## 2015-05-23 MED ORDER — DIPHENHYDRAMINE HCL 50 MG/ML IJ SOLN: 12.5000 mg | INTRAMUSCULAR | Status: DC | PRN

## 2015-05-23 MED ORDER — DIBUCAINE 1 % RE OINT: 1.0000 "application " | TOPICAL_OINTMENT | RECTAL | Status: DC | PRN

## 2015-05-23 MED ORDER — ONDANSETRON HCL 4 MG PO TABS: 4.0000 mg | ORAL_TABLET | ORAL | Status: DC | PRN

## 2015-05-23 MED ORDER — IBUPROFEN 600 MG PO TABS
600.0000 mg | ORAL_TABLET | Freq: Four times a day (QID) | ORAL | Status: DC
  Administered 2015-05-23 – 2015-05-24 (×6): 600 mg via ORAL
  Filled 2015-05-23 (×6): qty 1

## 2015-05-23 MED ORDER — PNEUMOCOCCAL VAC POLYVALENT 25 MCG/0.5ML IJ INJ
0.5000 mL | INJECTION | INTRAMUSCULAR | Status: AC
  Administered 2015-05-24: 0.5 mL via INTRAMUSCULAR
  Filled 2015-05-23: qty 0.5

## 2015-05-23 MED ORDER — FERROUS SULFATE 325 (65 FE) MG PO TABS
325.0000 mg | ORAL_TABLET | Freq: Two times a day (BID) | ORAL | Status: DC
  Administered 2015-05-23 – 2015-05-24 (×3): 325 mg via ORAL
  Filled 2015-05-23 (×3): qty 1

## 2015-05-23 MED ORDER — BISACODYL 10 MG RE SUPP: 10.0000 mg | Freq: Every day | RECTAL | Status: DC | PRN

## 2015-05-23 MED ORDER — PHENYLEPHRINE 40 MCG/ML (10ML) SYRINGE FOR IV PUSH (FOR BLOOD PRESSURE SUPPORT): 80.0000 ug | PREFILLED_SYRINGE | INTRAVENOUS | Status: DC | PRN

## 2015-05-23 MED ORDER — METHYLERGONOVINE MALEATE 0.2 MG PO TABS: 0.2000 mg | ORAL_TABLET | ORAL | Status: DC | PRN

## 2015-05-23 MED ORDER — MEASLES, MUMPS & RUBELLA VAC ~~LOC~~ INJ
0.5000 mL | INJECTION | Freq: Once | SUBCUTANEOUS | Status: DC
  Filled 2015-05-23: qty 0.5

## 2015-05-23 MED ORDER — ZOLPIDEM TARTRATE 5 MG PO TABS: 5.0000 mg | ORAL_TABLET | Freq: Every evening | ORAL | Status: DC | PRN

## 2015-05-23 MED ORDER — WITCH HAZEL-GLYCERIN EX PADS: 1.0000 "application " | MEDICATED_PAD | CUTANEOUS | Status: DC | PRN

## 2015-05-23 NOTE — Lactation Note (Addendum)
This note was copied from the chart of Brittany Williams. Lactation Consultation Note  Patient Name: Brittany Jeani Fassnacht [id number] Date: 05/23/2015 Reason for consult: Initial assessment  Baby 10 hours old. Mom reports that she attempted to nurse baby within the last hour, but baby was not interested. Enc mom to undress baby, but mom states that she has just dressed the baby and would rather not. Discussed with mom that STS is beneficial for baby and helps baby stay more alert with latching and breastfeeding, but mom still did not want to undress baby. Demonstrated waking techniques, and baby alert but not wanting to latch. Baby swallowing hard and gagging a little while attempting to latch. Mom reports baby a fast delivery at the end and has spit up within the last hour as well. Discussed with mom that baby may feel full if she still has a lot of fluid from delivery.   Enc mom to offer lots of STS and nurse with cues. Enc attempting at the breast often, and hand expressing drops into the baby's mouth. Mom had questions about her nipple piercing and using alcohol when removing the nipple rings. Enc mom to just leave the rings out of the nipples altogether while nursing--instead of removing and putting back in, because mom states that she had to use alcohol on her nipple to removed dried colostrum before she could take the ring out. Discussed with mom how sensitive nipple tissue is and that alcohol will quickly dry and irritate the tissue. Mom states that she understands that she should not have the ring in the nipple when baby nursing.   Mom had questions about when to introduce pacifier, and all questions were answered. Enc mom to keep baby close to breast and attempt to nurse with cues. Mom given [facility] brochure, aware of OP/BFSG and LC phone line assistance after D/C. Enc mom to call for assistance with latching as needed.  Maternal Data    Feeding Feeding Type: Breast Fed Length of feed: 0 min  LATCH  Score/Interventions Latch: Too sleepy or reluctant, no latch achieved, no sucking elicited.  Audible Swallowing: None Intervention(s): Hand expression  Type of Nipple: Everted at rest and after stimulation (Mom has nipple piercing.)  Comfort (Breast/Nipple): Soft / non-tender     Hold (Positioning): Assistance needed to correctly position infant at breast and maintain latch.  LATCH Score: 5  Lactation Tools Discussed/Used     Consult Status Consult Status: Follow-up Date: 05/24/15 Follow-up type: In-patient    Geralynn Ochs 05/23/2015, 1:03 PM

## 2015-05-23 NOTE — H&P (Signed)
Brittany Williams is a 20 y.o. female presenting for UC's. Maternal Medical History:  Reason for admission: Contractions.   Prenatal complications: no prenatal complications Prenatal Complications - Diabetes: none.    OB History    Gravida Para Term Preterm AB TAB SAB Ectopic Multiple Living   0 1     Past Medical History  Diagnosis Date  . Hypoglycemia   . Abnormal Pap smear   . Ovarian cyst   . Gonorrhea   . Chlamydia   . Asthma   . Anxiety    Past Surgical History  Procedure Laterality Date  . Appendectomy    . Cystectomy    . Wisdom tooth extraction     Family History: family history includes Diabetes in her other; Hypertension in her other. There is no history of Other. Social History:  reports that she quit smoking about 7 months ago. Her smoking use included Cigarettes. She smoked 0.50 packs per day. She has never used smokeless tobacco. She reports that she does not drink alcohol or use illicit drugs.   Prenatal Transfer Tool  Maternal Diabetes: No Genetic Screening: Normal Maternal Ultrasounds/Referrals: Normal Fetal Ultrasounds or other Referrals:  None Maternal Substance Abuse:  No Significant Maternal Medications:  None Significant Maternal Lab Results:  None Other Comments:  None  Review of Systems  All other systems reviewed and are negative.   Dilation: 10 Effacement (%): 100 Station: +2 Exam by:: E. Siska, RN Blood pressure 122/59, pulse 96, temperature 98.1 F (36.7 C), temperature source Oral, resp. rate 16, height 5' (1.524 m), weight 186 lb (84.369 kg), last menstrual period 08/28/2014, SpO2 99 %, unknown if currently breastfeeding. Maternal Exam:  Uterine Assessment: Contraction strength is moderate.  Contraction frequency is regular.   Abdomen: Patient reports no abdominal tenderness. Fetal presentation: vertex  Introitus: Normal vulva. Normal vagina.  Cervix: Cervix evaluated by digital exam.     Physical Exam  Nursing note  and vitals reviewed. Constitutional: She is oriented to person, place, and time. She appears well-developed and well-nourished.  HENT:  Head: Normocephalic and atraumatic.  Eyes: Conjunctivae are normal. Pupils are equal, round, and reactive to light.  Neck: Normal range of motion. Neck supple.  Cardiovascular: Normal rate and regular rhythm.   Respiratory: Effort normal and breath sounds normal.  GI: Soft. Bowel sounds are normal.  Genitourinary: Vagina normal and uterus normal.  Musculoskeletal: Normal range of motion.  Neurological: She is alert and oriented to person, place, and time.  Skin: Skin is warm and dry.  Psychiatric: She has a normal mood and affect. Her behavior is normal. Judgment and thought content normal.    Prenatal labs: ABO, Rh: --/--/O POS, O POS (02/06 0110) Antibody: NEG (02/06 0110) Rubella: 4.74 (07/19 1504) RPR: NON REAC (11/29 1217)  HBsAg: NEGATIVE (07/19 1504)  HIV: NONREACTIVE (11/29 1217)  GBS: NOT DETECTED (01/19 1328)   Assessment/Plan: 38 weeks.  Active labor.  Admit.   HARPER,CHARLES A 05/23/2015, 8:47 AM

## 2015-05-24 MED ORDER — OXYCODONE-ACETAMINOPHEN 5-325 MG PO TABS: 1.0000 | ORAL_TABLET | ORAL | Status: DC | PRN

## 2015-05-24 MED ORDER — IBUPROFEN 800 MG PO TABS: 800.0000 mg | ORAL_TABLET | Freq: Three times a day (TID) | ORAL | Status: DC | PRN

## 2015-05-24 NOTE — Clinical Social Work Maternal (Signed)
CLINICAL SOCIAL WORK MATERNAL/CHILD NOTE  Patient Details  Name: Brittany Williams MRN: [id number] Date of Birth: 09/09/1995  Date:  12/11/2015  Clinical Social Worker Initiating Note:  Loleta Books MSW, LCSW Date/ Time Initiated:  05/24/15/1015    Child's Name:  Mariyam   Legal Guardian:  Cena Benton and Apolinar Junes  Need for Interpreter:  None   Date of Referral:  Jun 17, 2015     Reason for Referral: History of anxiety  Referral Source:  [facility]   Address:  [address] [place], [place] [address]  Phone number:  [phone]   Household Members:  Significant Other   Natural Supports (not living in the home):  Immediate Family, Extended Family   Professional Supports: None   Employment: Full-time   Type of Work:     Education:      Architect:  [employer]   Other Resources:  [facility]   Cultural/Religious Considerations Which May Impact Care:  None reported  Strengths:  Home prepared for child , Pediatrician chosen , Ability to meet basic needs    Risk Factors/Current Problems:   1. Mental Health Concerns: MOB presents with a history of anxiety. MOB denied increase in or presence of symptoms during the pregnancy.     Cognitive State:  Able to Concentrate , Alert , Goal Oriented , Linear Thinking    Mood/Affect:  Euthymic , Calm    CSW Assessment:  CSW received request for consult due to MOB presenting with a history of anxiety.  MOB provided consent for assessment to be completed in the presence of her mother.  MOB originally presented as closed, guarded, and denied need for assessment since her anxiety is "not that bad".  CSW acknowledged her statement, but continued to provide education on commonality of symptoms.  As CSW normalized presence of symptoms and provided education on spectrum of symptoms, MOB became more receptive to the visit; however, she did not discuss at length her history of anxiety or her symptoms during the pregnancy.   Per MOB, she  feels strongly that she will not experience symptoms since, "I already love my baby" and she could not imagine ever harming her baby.  CSW acknowledged her statement, but also discussed how the visuals on television tend to be postpartum psychosis.  CSW highlighted common symptoms of perinatal anxiety and depression that may exisit secondary to role transition, increased hormone levels, and sleep deprivation.  MOB did not identify with any particular symptoms, but acknowledged that there is a range of presenting symptoms that may occur.  MOB clarified that she has had anxiety "forever" but never knew that her symptoms were congruent with anxiety until she came to the hospital "last year" s/p panic attack.  MOB was unable to recall a specific trigger, but discussed that it was during a high stress time at work. MOB denied any need for treatment, and denied any additional panic attacks since that time. She stated that it has been "mind over matter", and she routinely tells herself to "stay calm".  CSW continued to explore with MOB cognitive restructuring techniques that can assist to reduce her anxiety, such as examining evidence for and against her thought. MOB presented with awareness that thoughts are not always accurate, and that often it is necessary to pull back and recognize that are thoughts are irrational or incomplete.    CSW again reviewed normative symptoms of perinatal mood disorders that may occur, and MOB denied presence of symptoms. MOB reported increase in irritability during the  final month of her pregnancy, but denied presence of additional symptoms. CSW encouraged MOB to closely monitor her mood, and to notify her medical provider if she notes of symptoms postpartum. MOB agreeable. CSW ended by exploring with MOB her support systems and non-pharmocolgical interventions to support her mental health.  MOB stated that she feels confident caring for the infant due to her strong support system. She  shared that she knows that she is not alone, and reported that the FOB has been highly attentive to her and the infant. MOB also endorsed a strong relationship with her mother, and denied any barriers to asking for and accepting help.  MOB stated that she is aware of the importance of sleep and self-care, and shared that she had a previous awareness of caring for herself on a regular basis in order to support her mental health.   MOB denied additional questions, concerns, or needs at this time. She agreed to contact CSW if additional needs arise.   CSW Plan/Description:   1. Patient/Family Education-- Perinatal mood and anxiety disorders 2. No Further Intervention Required/No Barriers to Discharge    Alya Smaltz N, LCSW 05/24/2015, 10:41 AM  

## 2015-05-24 NOTE — Lactation Note (Signed)
This note was copied from the chart of Brittany Williams. Lactation Consultation Note  Patient Name: Brittany Laray Rivkin [id number] Date: 05/24/2015 Reason for consult: Follow-up assessment RN at bedside and reports observing latch and giving LS=9. Mom reports her breasts are becoming heavier. Basic teaching reviewed with Mom. Advised to continue to BF with feeding ques, at least 8-12 times in 24 hours. Advised to keep baby nursing for 15-20 minutes both breasts when possible. Cluster feeding discussed. Engorgement care reviewed if needed. Advised of OP services and support group. Encouraged Mom to call for [facility] assist before d/c if desired.  Maternal Data    Feeding Feeding Type: Breast Fed Length of feed: 12 min  LATCH Score/Interventions Latch: Grasps breast easily, tongue down, lips flanged, rhythmical sucking.  Audible Swallowing: Spontaneous and intermittent  Type of Nipple: Everted at rest and after stimulation  Comfort (Breast/Nipple): Filling, red/small blisters or bruises, mild/mod discomfort  Problem noted: Filling Interventions (Filling): Frequent nursing;Massage  Hold (Positioning): No assistance needed to correctly position infant at breast.  LATCH Score: 9  Lactation Tools Discussed/Used Tools: Pump Breast pump type: Manual   Consult Status Consult Status: Complete Date: 05/24/15 Follow-up type: In-patient    Alfred Levins 05/24/2015, 9:39 AM

## 2015-05-24 NOTE — Discharge Summary (Signed)
Obstetric Discharge Summary Reason for Admission: onset of labor Prenatal Procedures: ultrasound Intrapartum Procedures: spontaneous vaginal delivery Postpartum Procedures: none Complications-Operative and Postpartum: 1st  degree perineal laceration HEMOGLOBIN  Date Value Ref Range Status  05/23/2015 11.3* 12.0 - 15.0 g/dL Final   HCT  Date Value Ref Range Status  05/23/2015 33.3* 36.0 - 46.0 % Final    Physical Exam:  General: alert, cooperative and no distress Lochia: appropriate Uterine Fundus: firm Incision: none DVT Evaluation: No evidence of DVT seen on physical exam. No cords or calf tenderness. No significant calf/ankle edema.  Discharge Diagnoses: Term Pregnancy-delivered  Discharge Information: Date: 05/24/2015 Activity: pelvic rest Diet: routine Medications: PNV, Ibuprofen, Colace and Percocet Condition: stable Instructions: refer to practice specific booklet Discharge to: home   Newborn Data: Live born female  Birth Weight: 6 lb 2.6 oz (2795 g) APGAR: 9, 9  Home with mother.  Navjot Loera A Gladyse Corvin 05/24/2015, 9:03 AM

## 2015-05-24 NOTE — Progress Notes (Signed)
Post Partum Day #1 Subjective: no complaints, up ad lib, voiding and tolerating PO  Objective: Blood pressure 132/73, pulse 82, temperature 97.9 F (36.6 C), temperature source Oral, resp. rate 19, height 5' (1.524 m), weight 186 lb (84.369 kg), last menstrual period 08/28/2014, SpO2 99 %, unknown if currently breastfeeding.  Physical Exam:  General: alert, cooperative and no distress Lochia: appropriate Uterine Fundus: firm Incision: none DVT Evaluation: No evidence of DVT seen on physical exam. No cords or calf tenderness. No significant calf/ankle edema.   Recent Labs  05/23/15 0110 05/23/15 1435  HGB 12.6 11.3*  HCT 36.8 33.3*    Assessment/Plan: Discharge home and Breastfeeding   LOS: 1 day   Rachelle A Denney 05/24/2015, 9:00 AM

## 2015-05-25 ENCOUNTER — Encounter: Payer: Medicaid Other | Admitting: Certified Nurse Midwife

## 2015-06-01 ENCOUNTER — Encounter: Payer: Medicaid Other | Admitting: Certified Nurse Midwife

## 2015-06-08 ENCOUNTER — Encounter: Payer: Self-pay | Admitting: Certified Nurse Midwife

## 2015-06-08 ENCOUNTER — Encounter: Payer: Medicaid Other | Admitting: Certified Nurse Midwife

## 2015-06-08 ENCOUNTER — Ambulatory Visit (INDEPENDENT_AMBULATORY_CARE_PROVIDER_SITE_OTHER): Payer: Medicaid Other | Admitting: Certified Nurse Midwife

## 2015-06-08 NOTE — Progress Notes (Signed)
Patient ID: FAJR FIFE, female   DOB: 1996-03-22, 22 y.o.   MRN: [id number]  Subjective:     Brittany Williams is a 20 y.o. female who presents for a postpartum visit. She is 2 weeks postpartum following a spontaneous vaginal delivery. I have fully reviewed the prenatal and intrapartum course. The delivery was at 39 gestational weeks. Outcome: spontaneous vaginal delivery. Anesthesia: IVP Fentanyl. Postpartum course has been normal. Baby's course has been normal. Baby is feeding by breast. Bleeding thin lochia and clear. Bowel function is normal. Bladder function is normal. Patient is not sexually active. Contraception method is abstinence. Postpartum depression screening: negative.  Tobacco, alcohol and substance abuse history reviewed.  Adult immunizations reviewed including TDAP, rubella and varicella.  The following portions of the patient's history were reviewed and updated as appropriate: allergies, current medications, past family history, past medical history, past social history, past surgical history and problem list.  Review of Systems Pertinent items noted in HPI and remainder of comprehensive ROS otherwise negative.   Objective:    BP 113/79 mmHg  Pulse 100  Temp(Src) 98.1 F (36.7 C)  Wt 167 lb (75.751 kg)  Breastfeeding? Yes  General:  alert, cooperative and no distress   Breasts:  inspection negative, no nipple discharge or bleeding, no masses or nodularity palpable  Lungs: clear to auscultation bilaterally  Heart:  regular rate and rhythm, S1, S2 normal, no murmur, click, rub or gallop  Abdomen: soft, non-tender; bowel sounds normal; no masses,  no organomegaly   Vulva:  not evaluated  Vagina: not evaluated  Cervix:  not evaluated  Corpus: not examined  Adnexa:  not evaluated  Rectal Exam: Not performed.          50% of 15 min visit spent on counseling and coordination of care.  Assessment:     Normal 2 week postpartum exam. Pap smear not done at today's visit.    Contraception counseling  Plan:    1. Contraception: abstinence 2.  Considering Mirena or Liletta IUD 3. Follow up in: 4 weeks or as needed.  2hr GTT for h/o GDM/screening for DM q 3 yrs per ADA recommendations Preconception counseling provided Healthy lifestyle practices reviewed

## 2015-06-10 ENCOUNTER — Telehealth: Payer: Self-pay | Admitting: *Deleted

## 2015-06-10 NOTE — Telephone Encounter (Signed)
Patient is interested in an IUD for contraception. Per Orvilla Cornwall, CNM patient to return 4 weeks from her last appointment for her pp and IUD insertion.  Attempted to contact the patient and left message for patient to call the office.

## 2015-06-22 ENCOUNTER — Ambulatory Visit: Payer: Medicaid Other | Admitting: Certified Nurse Midwife

## 2015-07-05 ENCOUNTER — Encounter: Payer: Self-pay | Admitting: Certified Nurse Midwife

## 2015-07-05 ENCOUNTER — Ambulatory Visit (INDEPENDENT_AMBULATORY_CARE_PROVIDER_SITE_OTHER): Payer: Medicaid Other | Admitting: Certified Nurse Midwife

## 2015-07-05 NOTE — Progress Notes (Signed)
Patient ID: Brittany FrizzleLaura N Cotham, female   DOB: Jun 29, 1995, 20 y.o.   MRN: [id number]  Subjective:     Brittany Williams is a 20 y.o. female who presents for a postpartum visit. She is 6 weeks postpartum following a spontaneous vaginal delivery. I have fully reviewed the prenatal and intrapartum course. The delivery was at 39 gestational weeks. Outcome: spontaneous vaginal delivery. Anesthesia: none. Postpartum course has been normal. Baby's course has been normal. Baby is feeding by bottle - Similac Advance. Bleeding no bleeding. Bowel function is normal. Bladder function is normal. Patient is not sexually active. Contraception method is abstinence and condoms. Postpartum depression screening: negative.  Tobacco, alcohol and substance abuse history reviewed.  Adult immunizations reviewed including TDAP, rubella and varicella.  Reports having period last week.    The following portions of the patient's history were reviewed and updated as appropriate: allergies, current medications, past family history, past medical history, past social history, past surgical history and problem list.  Review of Systems Pertinent items noted in HPI and remainder of comprehensive ROS otherwise negative.   Objective:    BP 116/76 mmHg  Pulse 101  Temp(Src) 97.6 F (36.4 C)  Wt 173 lb (78.472 kg)  Breastfeeding? No  General:  alert, cooperative and no distress   Breasts:  inspection negative, no nipple discharge or bleeding, no masses or nodularity palpable  Lungs: clear to auscultation bilaterally  Heart:  regular rate and rhythm, S1, S2 normal, no murmur, click, rub or gallop  Abdomen: soft, non-tender; bowel sounds normal; no masses,  no organomegaly   Vulva:  normal  Vagina: normal vagina  Cervix:  no bleeding following Pap  Corpus: normal size, contour, position, consistency, mobility, non-tender  Adnexa:  normal adnexa  Rectal Exam: Not performed.          50% of 30 min visit spent on counseling and  coordination of care.  Assessment:     Normal 6 week postpartum exam. Pap smear done at today's visit.  Plan:    1. Contraception: abstinence 2. F/U Friday with Lyletta IUDf 3. Follow up in: 2 days or as needed.  2hr GTT for h/o GDM/screening for DM q 3 yrs per ADA recommendations Preconception counseling provided Healthy lifestyle practices reviewed

## 2015-07-06 ENCOUNTER — Ambulatory Visit: Payer: Medicaid Other | Admitting: Certified Nurse Midwife

## 2015-07-07 LAB — NUSWAB VAGINITIS PLUS (VG+)
CANDIDA GLABRATA, NAA: NEGATIVE
Candida albicans, NAA: NEGATIVE
Chlamydia trachomatis, NAA: NEGATIVE
NEISSERIA GONORRHOEAE, NAA: NEGATIVE
TRICH VAG BY NAA: NEGATIVE

## 2015-07-08 ENCOUNTER — Ambulatory Visit (INDEPENDENT_AMBULATORY_CARE_PROVIDER_SITE_OTHER): Payer: Medicaid Other | Admitting: Certified Nurse Midwife

## 2015-07-08 VITALS — BP 117/80 | HR 75

## 2015-07-08 DIAGNOSIS — Z3202 Encounter for pregnancy test, result negative: Secondary | ICD-10-CM

## 2015-07-08 DIAGNOSIS — Z01818 Encounter for other preprocedural examination: Secondary | ICD-10-CM

## 2015-07-08 DIAGNOSIS — Z30014 Encounter for initial prescription of intrauterine contraceptive device: Secondary | ICD-10-CM | POA: Diagnosis not present

## 2015-07-08 DIAGNOSIS — Z30430 Encounter for insertion of intrauterine contraceptive device: Secondary | ICD-10-CM

## 2015-07-08 LAB — POCT URINE PREGNANCY: Preg Test, Ur: NEGATIVE

## 2015-07-08 NOTE — Telephone Encounter (Signed)
Patient seen for IUD insertion on 07-08-15.

## 2015-07-08 NOTE — Progress Notes (Signed)
Patient ID: Brittany Williams, female   DOB: 1995-05-20, 20 y.o.   MRN: [id number]  IUD Procedure Note   DIAGNOSIS: Desires long-term, reversible contraception   PROCEDURE: IUD placement Performing Provider: Orvilla Cornwallachelle Rathana Viveros CNM  Patient counseled prior to procedure. I explained risks and benefits of Lyletta IUD, reviewed alternative forms of contraception. Patient stated understanding and consented to continue with procedure.   LMP: unknown, postpartum Pregnancy Test: Negative Lot #: [id number] Expiration Date: 07/2018   IUD type: [   ] Mirena   [   ] Paraguard  [   ] Christean GriefSkyla   [   ]  Kyleena  [X]  Liletta  PROCEDURE:  Timeout procedure was performed to ensure right patient and right site.  A bimanual exam was performed to determine the position of the uterus, retroverted. The speculum was placed. The vagina and cervix was sterilized in the usual manner and sterile technique was maintained throughout the course of the procedure. A single toothed tenaculum was applied to the posterior lip of the cervix and gentle traction applied. The depth of the uterus was sounded to 9 cm. With gentle traction on the tenaculum, the IUD was inserted to the appropriate depth and inserted without difficulty.  The string was cut to an estimated 4 cm length. Bleeding was minimal. The patient tolerated the procedure well.   Follow up: The patient tolerated the procedure well without complications.  Standard post-procedure care is explained and return precautions are given.  Orvilla Cornwallachelle Hershall Benkert CNM

## 2015-07-12 LAB — PAP IG W/ RFLX HPV ASCU: PAP SMEAR COMMENT: 0

## 2015-07-12 LAB — HPV DNA PROBE HIGH RISK, AMPLIFIED: HPV, HIGH-RISK: POSITIVE — AB

## 2015-07-19 LAB — HPV GENOTYPES 16/18,45
HPV Genotype 16: NEGATIVE
HPV Genotype 18,45: NEGATIVE

## 2015-07-19 LAB — SPECIMEN STATUS REPORT

## 2015-07-21 ENCOUNTER — Encounter: Payer: Self-pay | Admitting: *Deleted

## 2015-07-22 ENCOUNTER — Ambulatory Visit: Payer: Medicaid Other | Admitting: Certified Nurse Midwife

## 2015-08-10 ENCOUNTER — Encounter: Payer: Self-pay | Admitting: Certified Nurse Midwife

## 2015-08-10 ENCOUNTER — Ambulatory Visit (INDEPENDENT_AMBULATORY_CARE_PROVIDER_SITE_OTHER): Payer: Medicaid Other | Admitting: Certified Nurse Midwife

## 2015-08-10 VITALS — BP 116/75 | HR 93 | Wt 175.0 lb

## 2015-08-10 DIAGNOSIS — L0233 Carbuncle of buttock: Secondary | ICD-10-CM | POA: Diagnosis not present

## 2015-08-10 DIAGNOSIS — L0232 Furuncle of buttock: Secondary | ICD-10-CM

## 2015-08-10 DIAGNOSIS — N760 Acute vaginitis: Secondary | ICD-10-CM | POA: Diagnosis not present

## 2015-08-10 MED ORDER — MUPIROCIN 2 % EX OINT: 1.0000 "application " | TOPICAL_OINTMENT | Freq: Two times a day (BID) | CUTANEOUS | Status: DC | PRN

## 2015-08-10 MED ORDER — FLUCONAZOLE 100 MG PO TABS: 100.0000 mg | ORAL_TABLET | Freq: Once | ORAL | Status: DC

## 2015-08-10 MED ORDER — TERCONAZOLE 0.4 % VA CREA: 1.0000 | TOPICAL_CREAM | Freq: Every day | VAGINAL | Status: DC

## 2015-08-10 NOTE — Progress Notes (Signed)
Patient ID: Brittany Williams, female   DOB: 13-Aug-1995, 20 y.o.   MRN: [id number]   Chief Complaint  Patient presents with  . Follow-up    IUD    HPI Brittany Williams is a 20 y.o. female.  Here for f/u on IUD.  Is very happy with her Lyletta IUD.  Has had only a few days of spotting, no period.  Is still breastfeeding.  Doing well postpartum.  States that her partner can sometimes feel the IUD strings and desires to have them trimmed.   States that she had a boil of her buttock, that is itching and bleeds when she scratches, has had it for several weeks.    HPI  Past Medical History  Diagnosis Date  . Hypoglycemia   . Abnormal Pap smear   . Ovarian cyst   . Gonorrhea   . Chlamydia   . Asthma   . Anxiety     Past Surgical History  Procedure Laterality Date  . Appendectomy    . Cystectomy    . Wisdom tooth extraction      Family History  Problem Relation Age of Onset  . Other Neg Hx   . Hypertension Other   . Diabetes Other     Social History Social History  Substance Use Topics  . Smoking status: Former Smoker -- 0.50 packs/day    Types: Cigarettes    Quit date: 09/23/2014  . Smokeless tobacco: Never Used  . Alcohol Use: No    Allergies  Allergen Reactions  . Latex Itching and Rash    Current Outpatient Prescriptions  Medication Sig Dispense Refill  . Prenat w/o A-FeCbGl-DSS-FA-DHA (CITRANATAL 90 DHA) 90-1 & 300 MG MISC Take 1 tablet by mouth daily. 60 each 6  . fluconazole (DIFLUCAN) 100 MG tablet Take 1 tablet (100 mg total) by mouth once. Repeat dose in 48-72 hour. 3 tablet 0  . mupirocin ointment (BACTROBAN) 2 % Apply 1 application topically 2 (two) times daily as needed. 30 g PRN  . terconazole (TERAZOL 7) 0.4 % vaginal cream Place 1 applicator vaginally at bedtime. 45 g 0   No current facility-administered medications for this visit.    Review of Systems Review of Systems Constitutional: negative for fatigue and weight loss Respiratory: negative for  cough and wheezing Cardiovascular: negative for chest pain, fatigue and palpitations Gastrointestinal: negative for abdominal pain and change in bowel habits Genitourinary:negative, boil of buttock Integument/breast: negative for nipple discharge Musculoskeletal:negative for myalgias Neurological: negative for gait problems and tremors Behavioral/Psych: negative for abusive relationship, depression Endocrine: negative for temperature intolerance     Blood pressure 116/75, pulse 93, weight 175 lb (79.379 kg), not currently breastfeeding.  Physical Exam Physical Exam General:   alert  Skin:   no rash or abnormalities  Lungs:   clear to auscultation bilaterally  Heart:   regular rate and rhythm, S1, S2 normal, no murmur, click, rub or gallop  Breasts:   deferred  Abdomen:  normal findings: no organomegaly, soft, non-tender and no hernia  Pelvis:  External genitalia: normal general appearance Urinary system: urethral meatus normal and bladder without fullness, nontender Vaginal: normal without tenderness, induration or masses, + white chunky vaginal discharge Cervix: normal appearance, IUD strings present, trimmed 1 cm.   Adnexa: normal bimanual exam Uterus: anteverted and non-tender, normal size Rectal crease: boil healed, + erythema    50% of 15 min visit spent on counseling and coordination of care.   Data Reviewed Previous medical hx, meds  Assessment     IUD check up: strings pressent  Vulvovaginal candidasis Buttock fold leison, healed.     Plan    Orders Placed This Encounter  Procedures  . NuSwab Vaginitis Plus (VG+)   Meds ordered this encounter  Medications  . mupirocin ointment (BACTROBAN) 2 %    Sig: Apply 1 application topically 2 (two) times daily as needed.    Dispense:  30 g    Refill:  PRN  . fluconazole (DIFLUCAN) 100 MG tablet    Sig: Take 1 tablet (100 mg total) by mouth once. Repeat dose in 48-72 hour.    Dispense:  3 tablet    Refill:  0  .  terconazole (TERAZOL 7) 0.4 % vaginal cream    Sig: Place 1 applicator vaginally at bedtime.    Dispense:  45 g    Refill:  0    Possible management options include: skin biopsy of skin leision if not healing.   Follow up as needed or 1 year for annual exam.

## 2015-08-13 LAB — NUSWAB VAGINITIS PLUS (VG+)
Candida albicans, NAA: NEGATIVE
Candida glabrata, NAA: NEGATIVE
Chlamydia trachomatis, NAA: NEGATIVE
Neisseria gonorrhoeae, NAA: NEGATIVE
Trich vag by NAA: NEGATIVE

## 2016-01-21 ENCOUNTER — Ambulatory Visit (HOSPITAL_COMMUNITY)
Admission: EM | Admit: 2016-01-21 | Discharge: 2016-01-21 | Disposition: A | Discharge status: Home / Self Care | Payer: Self-pay | Attending: Internal Medicine | Admitting: Internal Medicine

## 2016-01-21 ENCOUNTER — Ambulatory Visit (INDEPENDENT_AMBULATORY_CARE_PROVIDER_SITE_OTHER): Payer: Self-pay

## 2016-01-21 ENCOUNTER — Encounter (HOSPITAL_COMMUNITY): Payer: Self-pay | Admitting: *Deleted

## 2016-01-21 DIAGNOSIS — S96912A Strain of unspecified muscle and tendon at ankle and foot level, left foot, initial encounter: Secondary | ICD-10-CM

## 2016-01-21 MED ORDER — IBUPROFEN 800 MG PO TABS: 800.0000 mg | ORAL_TABLET | Freq: Three times a day (TID) | ORAL | 0 refills | Status: DC

## 2016-01-21 MED ORDER — HYDROCODONE-ACETAMINOPHEN 5-325 MG PO TABS
ORAL_TABLET | ORAL | Status: AC
  Filled 2016-01-21: qty 1

## 2016-01-21 MED ORDER — HYDROCODONE-ACETAMINOPHEN 5-325 MG PO TABS
1.0000 | ORAL_TABLET | Freq: Once | ORAL | Status: AC
Start: 2016-01-21 — End: 2016-01-21
  Administered 2016-01-21: 1 via ORAL

## 2016-01-21 NOTE — ED Triage Notes (Signed)
Pt  Injured  Her  l   Foot/ankle  yest   When  She  Stepped  In a  Hole  And  Turned  It   She  Has  Pain and  Swelling of the  Affected  Ankle

## 2016-01-21 NOTE — ED Provider Notes (Signed)
CSN: [id number]     Arrival date & time 01/21/16  1829 History   First MD Initiated Contact with Patient 01/21/16 2035     Chief Complaint  Patient presents with  . Ankle Injury   (Consider location/radiation/quality/duration/timing/severity/associated sxs/prior Treatment) Patient is a 20 y.o female, presents today for left foot and left ankle injury that occurred yesterday. She reports that she stepped into a little hole and twisted her ankle. She reports pain particularly at the lateral ankle and lateral dorsal aspect of the foot over the metatarsal area. Patient reports the swelling has improved but the pain have not. She rates her current pain as 8/10.       Past Medical History:  Diagnosis Date  . Abnormal Pap smear   . Anxiety   . Asthma   . Chlamydia   . Gonorrhea   . Hypoglycemia   . Ovarian cyst    Past Surgical History:  Procedure Laterality Date  . APPENDECTOMY    . CYSTECTOMY    . WISDOM TOOTH EXTRACTION     Family History  Problem Relation Age of Onset  . Hypertension Other   . Diabetes Other   . Other Neg Hx    Social History  Substance Use Topics  . Smoking status: Former Smoker    Packs/day: 0.50    Types: Cigarettes    Quit date: 09/23/2014  . Smokeless tobacco: Never Used  . Alcohol use No   OB History    Gravida Para Term Preterm AB Living   1 1 1     1    SAB TAB Ectopic Multiple Live Births         0 1     Review of Systems  All other systems reviewed and are negative.   Allergies  Latex  Home Medications   Prior to Admission medications   Medication Sig Start Date End Date Taking? Authorizing Provider  fluconazole (DIFLUCAN) 100 MG tablet Take 1 tablet (100 mg total) by mouth once. Repeat dose in 48-72 hour. 08/10/15   Rachelle A Denney, CNM  ibuprofen (ADVIL,MOTRIN) 800 MG tablet Take 1 tablet (800 mg total) by mouth 3 (three) times daily. 01/21/16   Lucia EstelleFeng Atheena Spano, NP  mupirocin ointment (BACTROBAN) 2 % Apply 1 application topically 2  (two) times daily as needed. 08/10/15   Rachelle A Denney, CNM  Prenat w/o A-FeCbGl-DSS-FA-DHA (CITRANATAL 90 DHA) 90-1 & 300 MG MISC Take 1 tablet by mouth daily. 11/02/14   Rachelle A Denney, CNM  terconazole (TERAZOL 7) 0.4 % vaginal cream Place 1 applicator vaginally at bedtime. 08/10/15   Roe Coombsachelle A Denney, CNM   Meds Ordered and Administered this Visit   Medications  HYDROcodone-acetaminophen (NORCO/VICODIN) 5-325 MG per tablet 1 tablet (1 tablet Oral Given 01/21/16 2053)    BP 112/65 (BP Location: Right Arm)   Pulse 72   Temp 98.6 F (37 C) (Oral)   Resp 16   LMP 01/07/2016   SpO2 100%  No data found.   Physical Exam  Constitutional: She is oriented to person, place, and time. She appears well-developed and well-nourished.  Cardiovascular: Normal rate.   Pulmonary/Chest: Effort normal.  Musculoskeletal:  Patient in wheelchair. Left foot and ankle has swelling. Has pain on palpation over the lateral ankle and over the 4th and 5th metatarsal areas. Patient has limited ROM due to pain.   Neurological: She is alert and oriented to person, place, and time.  Nursing note and vitals reviewed.   Urgent Care Course  Clinical Course    Procedures (including critical care time)  Labs Review Labs Reviewed - No data to display  Imaging Review Dg Ankle Complete Left  Result Date: 01/21/2016 CLINICAL DATA:  Stepped into a hole, twisting the ankle. Pain at the lateral aspect of the left foot and ankle. EXAM: LEFT ANKLE COMPLETE - 3+ VIEW; LEFT FOOT - COMPLETE 3+ VIEW COMPARISON:  Left ankle 08/26/2006 FINDINGS: Left foot and left ankle appear intact. No evidence of acute fracture or subluxation. No focal bone lesion or bone destruction. Bone cortex and trabecular architecture appear intact. No radiopaque soft tissue foreign bodies. IMPRESSION: No evidence of acute fracture or dislocation involving the left foot or the left ankle. Electronically Signed   By: Burman Nieves M.D.   On:  01/21/2016 21:15   Dg Foot Complete Left  Result Date: 01/21/2016 CLINICAL DATA:  Stepped into a hole, twisting the ankle. Pain at the lateral aspect of the left foot and ankle. EXAM: LEFT ANKLE COMPLETE - 3+ VIEW; LEFT FOOT - COMPLETE 3+ VIEW COMPARISON:  Left ankle 08/26/2006 FINDINGS: Left foot and left ankle appear intact. No evidence of acute fracture or subluxation. No focal bone lesion or bone destruction. Bone cortex and trabecular architecture appear intact. No radiopaque soft tissue foreign bodies. IMPRESSION: No evidence of acute fracture or dislocation involving the left foot or the left ankle. Electronically Signed   By: Burman Nieves M.D.   On: 01/21/2016 21:15    MDM   1. Ankle strain, left, initial encounter   2. Muscle strain of left foot, initial encounter    Foot and ankle xray are both negative. Patient instructed to take ibuprofen 800 TID for pain. Instructed to rest the extremity, remain off work tomorrow and Monday. Ankle brace applied per patient request.  Instructed to f/u with PCP if pain does not improve. Patient denies any questions. Discharge paperwork given.        Lucia Estelle, NP 01/21/16 2138

## 2016-08-29 ENCOUNTER — Ambulatory Visit (INDEPENDENT_AMBULATORY_CARE_PROVIDER_SITE_OTHER): Payer: Medicaid Other | Admitting: Certified Nurse Midwife

## 2016-08-29 ENCOUNTER — Other Ambulatory Visit (HOSPITAL_COMMUNITY)
Admission: RE | Admit: 2016-08-29 | Discharge: 2016-08-29 | Disposition: A | Discharge status: Home / Self Care | Payer: Medicaid Other | Source: Ambulatory Visit | Attending: Certified Nurse Midwife | Admitting: Certified Nurse Midwife

## 2016-08-29 ENCOUNTER — Encounter: Payer: Self-pay | Admitting: *Deleted

## 2016-08-29 ENCOUNTER — Encounter: Payer: Self-pay | Admitting: Certified Nurse Midwife

## 2016-08-29 VITALS — BP 113/75 | HR 80 | Ht 61.0 in | Wt 164.0 lb

## 2016-08-29 DIAGNOSIS — Z30432 Encounter for removal of intrauterine contraceptive device: Secondary | ICD-10-CM

## 2016-08-29 DIAGNOSIS — Z3009 Encounter for other general counseling and advice on contraception: Secondary | ICD-10-CM | POA: Diagnosis not present

## 2016-08-29 DIAGNOSIS — Z01419 Encounter for gynecological examination (general) (routine) without abnormal findings: Secondary | ICD-10-CM

## 2016-08-29 DIAGNOSIS — Z0000 Encounter for general adult medical examination without abnormal findings: Secondary | ICD-10-CM | POA: Diagnosis not present

## 2016-08-29 DIAGNOSIS — Z113 Encounter for screening for infections with a predominantly sexual mode of transmission: Secondary | ICD-10-CM

## 2016-08-29 MED ORDER — PRENATE PIXIE 10-0.6-0.4-200 MG PO CAPS: 1.0000 | ORAL_CAPSULE | Freq: Every day | ORAL | 12 refills | Status: DC

## 2016-08-29 NOTE — Progress Notes (Signed)
Patient is in the office to have IUD removed. Pt states that she [facility].

## 2016-08-29 NOTE — Progress Notes (Signed)
.   Subjective:        Brittany Williams is a 21 y.o. female here for a routine annual exam and IUD removal.Patient has a history of ashtma anxiety,ovarian cyst,chlamyida,gonorrhea,hypogylcemia. Patient currently  Not having periods, on IUD,Patient has hx of nsvd in 2017 without complication. Patient declines a new method of birth control and desires to start trying to have another baby. Patient does desire Std testing today and currently says she is with one sexual partner,present at visit today. Patient states she does not exercise but would like to start. Personal health questionnaire:  Is patient Ashkenazi Jewish, have a family history of breast and/or ovarian cancer: no Is there a family history of uterine cancer diagnosed at age < 5450, gastrointestinal cancer, urinary tract cancer, family member who is a Personnel officerLynch syndrome-associated carrier: no Is the patient overweight and hypertensive, family history of diabetes, personal history of gestational diabetes, preeclampsia or PCOS: no Is patient over 4555, have PCOS,  family history of premature CHD under age 23, diabetes, smoke, have hypertension or peripheral artery disease:  no At any time, has a partner hit, kicked or otherwise hurt or frightened you?: no Over the past 2 weeks, have you felt down, depressed or hopeless?: no Over the past 2 weeks, have you felt little interest or pleasure in doing things?:no   Gynecologic History No LMP recorded. Patient is not currently having periods (Reason: IUD). Contraception: March 2017., Mirena Last Pap:  Results were: abnormal Last mammogram: not indicated .   Obstetric History OB History  Gravida Para Term Preterm AB Living  1 1 1     1   SAB TAB Ectopic Multiple Live Births        0 1    # Outcome Date GA Lbr Len/2nd Weight Sex Delivery Anes PTL Lv  1 Term 05/23/15 [id number] 11:46 / 01:03 6 lb 2.6 oz (2.795 kg) F Vag-Spont None  LIV      Past Medical History:  Diagnosis Date  . Abnormal Pap  smear   . Anxiety   . Asthma   . Chlamydia   . Gonorrhea   . Hypoglycemia   . Ovarian cyst     Past Surgical History:  Procedure Laterality Date  . APPENDECTOMY    . CYSTECTOMY    . WISDOM TOOTH EXTRACTION        Allergies  Allergen Reactions  . Latex Itching and Rash    Social History  Substance Use Topics  . Smoking status: Former Smoker    Packs/day: 0.50    Types: Cigarettes    Quit date: 09/23/2014  . Smokeless tobacco: Never Used  . Alcohol use No    Family History  Problem Relation Age of Onset  . Hypertension Other   . Diabetes Other   . Other Neg Hx       Review of Systems  Constitutional: negative for fatigue and weight loss Respiratory: negative for cough and wheezing Cardiovascular: negative for chest pain, fatigue and palpitations Gastrointestinal: negative for abdominal pain and change in bowel habits Musculoskeletal:negative for myalgias Neurological: negative for gait problems and tremors Behavioral/Psych: negative for abusive relationship, depression Endocrine: negative for temperature intolerance    Genitourinary:negative for abnormal menstrual periods, genital lesions, hot flashes, sexual problems and vaginal discharge Integument/breast: negative for breast lump, breast tenderness, nipple discharge and skin lesion(s)    Objective:       BP 113/75   Pulse 80   Ht 5\' 1"  (1.549 m)  Wt 164 lb (74.4 kg)   Breastfeeding? No   BMI 30.99 kg/m  General:   alert  Skin:   no rash or abnormalities  Lungs:   clear to auscultation bilaterally  Heart:   regular rate and rhythm, S1, S2 normal, no murmur, click, rub or gallop  Breasts:   normal without suspicious masses, skin or nipple changes or axillary nodes  Abdomen:  normal findings: no organomegaly, soft, non-tender and no hernia  Pelvis:  External genitalia: normal general appearance Urinary system: urethral meatus normal and bladder without fullness, nontender Vaginal: normal without  tenderness, induration or masses Cervix: normal appearance, no CMT, yellow discharge located around cervix and os. Adnexa: normal bimanual exam Uterus: anteverted and non-tender, normal size     GYNECOLOGY OFFICE PROCEDURE NOTE  Brittany Williams is a 21 y.o. G1P1001 here for Liletta IUD removal. No GYN concerns.  Last pap smear was on 07/05/15 and was ASCUS with +HPV.  IUD Removal  Patient identified, informed consent performed, consent signed.  Patient was in the dorsal lithotomy position, normal external genitalia was noted.  A speculum was placed in the patient's vagina, normal discharge was noted, no lesions. The cervix was visualized, no lesions, no abnormal discharge.  The strings of the IUD were grasped and pulled using ring forceps. The IUD was removed in its entirety.Patient tolerated the procedure well.    Patient will use nothing for contraception/ plans for pregnancy soon and she was told to avoid teratogens, take PNV and folic acid.  Routine preventative health maintenance measures emphasized.   Lab Review Urine pregnancy test no Labs reviewed yes Radiologic studies reviewed yes  50% of visit spent on counseling and coordination of care.    Assessment:    Healthy female exam. ,IUD removal,/Family planning,Pap Smear today  Preconception counseling  Plan:  Family planning education, guidance, and counseling -  Plan: Cervicovaginal ancillary only, Prenat-FeAsp-Meth-FA-DHA w/o A (PRENATE PIXIE) 10-0.6-0.4-200 MG CAPS  Well woman exam - Plan: Cervicovaginal ancillary only  Encounter for IUD removal - Plan: Cervicovaginal ancillary only  Screening examination for STD (sexually transmitted disease) - Plan: RPR, HIV antibody    Education reviewed: depression evaluation, low fat, low cholesterol diet and safe sex/STD prevention. Contraception: none. Follow up in: as needed.  Possible management options include: family planning vs birth control Follow up as needed  and 1 year for annual exam.

## 2016-08-30 LAB — HIV ANTIBODY (ROUTINE TESTING W REFLEX): HIV Screen 4th Generation wRfx: NONREACTIVE

## 2016-08-30 LAB — RPR: RPR Ser Ql: NONREACTIVE

## 2016-08-30 LAB — CYTOLOGY - PAP: Diagnosis: NEGATIVE

## 2016-08-30 LAB — CERVICOVAGINAL ANCILLARY ONLY
CHLAMYDIA, DNA PROBE: NEGATIVE
Neisseria Gonorrhea: NEGATIVE

## 2016-09-14 ENCOUNTER — Telehealth: Payer: Self-pay | Admitting: *Deleted

## 2016-09-14 NOTE — Telephone Encounter (Signed)
Patient called stating her insurance does not cover the prenatal vitamins. LM on VM- if she still has family planning [facility] it will not cover PNV and she may want to get OTC PNV if needed. Please let us know if she has a change.

## 2017-01-03 ENCOUNTER — Encounter (HOSPITAL_COMMUNITY): Payer: Self-pay | Admitting: *Deleted

## 2017-01-03 ENCOUNTER — Inpatient Hospital Stay (HOSPITAL_COMMUNITY)
Admission: AD | Admit: 2017-01-03 | Discharge: 2017-01-03 | Disposition: A | Discharge status: Home / Self Care | Payer: Self-pay | Source: Ambulatory Visit | Attending: Obstetrics and Gynecology | Admitting: Obstetrics and Gynecology

## 2017-01-03 ENCOUNTER — Inpatient Hospital Stay (HOSPITAL_COMMUNITY): Payer: Self-pay

## 2017-01-03 DIAGNOSIS — A084 Viral intestinal infection, unspecified: Secondary | ICD-10-CM

## 2017-01-03 DIAGNOSIS — Z87891 Personal history of nicotine dependence: Secondary | ICD-10-CM | POA: Insufficient documentation

## 2017-01-03 DIAGNOSIS — Z3A01 Less than 8 weeks gestation of pregnancy: Secondary | ICD-10-CM | POA: Insufficient documentation

## 2017-01-03 DIAGNOSIS — R109 Unspecified abdominal pain: Secondary | ICD-10-CM | POA: Insufficient documentation

## 2017-01-03 DIAGNOSIS — Z3491 Encounter for supervision of normal pregnancy, unspecified, first trimester: Secondary | ICD-10-CM

## 2017-01-03 DIAGNOSIS — O26891 Other specified pregnancy related conditions, first trimester: Secondary | ICD-10-CM

## 2017-01-03 DIAGNOSIS — O219 Vomiting of pregnancy, unspecified: Secondary | ICD-10-CM

## 2017-01-03 LAB — WET PREP, GENITAL
CLUE CELLS WET PREP: NONE SEEN
Sperm: NONE SEEN
TRICH WET PREP: NONE SEEN
YEAST WET PREP: NONE SEEN

## 2017-01-03 LAB — URINALYSIS, ROUTINE W REFLEX MICROSCOPIC
BILIRUBIN URINE: NEGATIVE
Glucose, UA: NEGATIVE mg/dL
Hgb urine dipstick: NEGATIVE
KETONES UR: NEGATIVE mg/dL
Nitrite: NEGATIVE
PH: 8 (ref 5.0–8.0)
Protein, ur: NEGATIVE mg/dL
Specific Gravity, Urine: 1.013 (ref 1.005–1.030)

## 2017-01-03 LAB — CBC
HCT: 43 % (ref 36.0–46.0)
Hemoglobin: 14.6 g/dL (ref 12.0–15.0)
MCH: 30 pg (ref 26.0–34.0)
MCHC: 34 g/dL (ref 30.0–36.0)
MCV: 88.3 fL (ref 78.0–100.0)
PLATELETS: 235 10*3/uL (ref 150–400)
RBC: 4.87 MIL/uL (ref 3.87–5.11)
RDW: 14.6 % (ref 11.5–15.5)
WBC: 14.4 10*3/uL — AB (ref 4.0–10.5)

## 2017-01-03 LAB — HCG, QUANTITATIVE, PREGNANCY: HCG, BETA CHAIN, QUANT, S: 57021 m[IU]/mL — AB (ref <5)

## 2017-01-03 LAB — POCT PREGNANCY, URINE: PREG TEST UR: POSITIVE — AB

## 2017-01-03 MED ORDER — METOCLOPRAMIDE HCL 10 MG PO TABS: 10.0000 mg | ORAL_TABLET | Freq: Three times a day (TID) | ORAL | 5 refills | Status: DC | PRN

## 2017-01-03 MED ORDER — PROMETHAZINE HCL 25 MG PO TABS
25.0000 mg | ORAL_TABLET | Freq: Four times a day (QID) | ORAL | Status: DC | PRN
  Administered 2017-01-03: 25 mg via ORAL
  Filled 2017-01-03: qty 1

## 2017-01-03 MED ORDER — PROMETHAZINE HCL 25 MG PO TABS: 12.5000 mg | ORAL_TABLET | Freq: Every day | ORAL | 5 refills | Status: DC

## 2017-01-03 NOTE — MAU Provider Note (Signed)
Chief Complaint: Emesis; Abdominal Pain; and Diarrhea   First Provider Initiated Contact with Patient 01/03/17 1322      SUBJECTIVE HPI: Brittany Williams is a 21 y.o. G2P1001 at [redacted]w[redacted]d by LMP who presents to maternity admissions reporting onset of n/v/d and suprapubic abdominal pain this morning with positive pregnancy test at home.  She reports eating pizza last night and her s/o has similar symptoms today.  She reports 2 episodes of emesis and 5-6 episodes of diarrhea, last one several hours ago today.  She has not tried any treatments. The abdominal pain is associated with vomiting and is sharp, suprapubic, worsening with walking.   She denies vaginal bleeding, vaginal itching/burning, urinary symptoms, h/a, dizziness, or fever/chills.     HPI  Past Medical History:  Diagnosis Date  . Abnormal Pap smear   . Anxiety   . Asthma   . Chlamydia   . Gonorrhea   . Hypoglycemia   . Ovarian cyst    Past Surgical History:  Procedure Laterality Date  . APPENDECTOMY    . CYSTECTOMY    . WISDOM TOOTH EXTRACTION     Social History   Social History  . Marital status: Single    Spouse name: N/A  . Number of children: N/A  . Years of education: N/A   Occupational History  . Not on file.   Social History Main Topics  . Smoking status: Former Smoker    Packs/day: 0.50    Types: Cigarettes    Quit date: 09/23/2014  . Smokeless tobacco: Never Used  . Alcohol use No  . Drug use: No  . Sexual activity: Yes    Partners: Male    Birth control/ protection: None, IUD   Other Topics Concern  . Not on file   Social History Narrative  . No narrative on file   No current facility-administered medications on file prior to encounter.    Current Outpatient Prescriptions on File Prior to Encounter  Medication Sig Dispense Refill  . Prenat w/o A-FeCbGl-DSS-FA-DHA (CITRANATAL 90 DHA) 90-1 & 300 MG MISC Take 1 tablet by mouth daily. (Patient not taking: Reported on 08/29/2016) 60 each 6  .  Prenat-FeAsp-Meth-FA-DHA w/o A (PRENATE PIXIE) 10-0.6-0.4-200 MG CAPS Take 1 tablet by mouth daily. (Patient not taking: Reported on 01/03/2017) 30 capsule 12   Allergies  Allergen Reactions  . Latex Itching and Rash    ROS:  Review of Systems  Constitutional: Negative for chills, fatigue and fever.  Respiratory: Negative for shortness of breath.   Cardiovascular: Negative for chest pain.  Gastrointestinal: Positive for abdominal pain, diarrhea, nausea and vomiting.  Genitourinary: Positive for pelvic pain. Negative for difficulty urinating, dysuria, flank pain, vaginal bleeding, vaginal discharge and vaginal pain.  Neurological: Negative for dizziness and headaches.  Psychiatric/Behavioral: Negative.      I have reviewed patient's Past Medical Hx, Surgical Hx, Family Hx, Social Hx, medications and allergies.   Physical Exam  Patient Vitals for the past 24 hrs:  BP Temp Temp src Pulse Resp Height Weight  01/03/17 1535 121/63 - - 75 - - -  01/03/17 1138 104/66 98.3 F (36.8 C) Oral 77 16 5' (1.524 m) 167 lb (75.8 kg)   Constitutional: Well-developed, well-nourished female in no acute distress.  Cardiovascular: normal rate Respiratory: normal effort GI: Abd soft, non-tender. Pos BS x 4 MS: Extremities nontender, no edema, normal ROM Neurologic: Alert and oriented x 4.  GU: Neg CVAT.  PELVIC EXAM: Cervix pink, visually closed, without lesion,  scant white creamy discharge, vaginal walls and external genitalia normal Bimanual exam: Cervix 0/long/high, firm, anterior, neg CMT, uterus nontender, nonenlarged, adnexa without tenderness, enlargement, or mass   LAB RESULTS Results for orders placed or performed during the hospital encounter of 01/03/17 (from the past 24 hour(s))  Urinalysis, Routine w reflex microscopic     Status: Abnormal   Collection Time: 01/03/17 11:42 AM  Result Value Ref Range   Color, Urine YELLOW YELLOW   APPearance HAZY (A) CLEAR   Specific Gravity, Urine  1.013 1.005 - 1.030   pH 8.0 5.0 - 8.0   Glucose, UA NEGATIVE NEGATIVE mg/dL   Hgb urine dipstick NEGATIVE NEGATIVE   Bilirubin Urine NEGATIVE NEGATIVE   Ketones, ur NEGATIVE NEGATIVE mg/dL   Protein, ur NEGATIVE NEGATIVE mg/dL   Nitrite NEGATIVE NEGATIVE   Leukocytes, UA SMALL (A) NEGATIVE   RBC / HPF 0-5 0 - 5 RBC/hpf   WBC, UA 0-5 0 - 5 WBC/hpf   Bacteria, UA RARE (A) NONE SEEN   Squamous Epithelial / LPF 6-30 (A) NONE SEEN   Mucus PRESENT    Sperm, UA PRESENT   Pregnancy, urine POC     Status: Abnormal   Collection Time: 01/03/17 11:55 AM  Result Value Ref Range   Preg Test, Ur POSITIVE (A) NEGATIVE  Wet prep, genital     Status: Abnormal   Collection Time: 01/03/17  1:48 PM  Result Value Ref Range   Yeast Wet Prep HPF POC NONE SEEN NONE SEEN   Trich, Wet Prep NONE SEEN NONE SEEN   Clue Cells Wet Prep HPF POC NONE SEEN NONE SEEN   WBC, Wet Prep HPF POC FEW (A) NONE SEEN   Sperm NONE SEEN   CBC     Status: Abnormal   Collection Time: 01/03/17  2:13 PM  Result Value Ref Range   WBC 14.4 (H) 4.0 - 10.5 K/uL   RBC 4.87 3.87 - 5.11 MIL/uL   Hemoglobin 14.6 12.0 - 15.0 g/dL   HCT 65.7 84.6 - 96.2 %   MCV 88.3 78.0 - 100.0 fL   MCH 30.0 26.0 - 34.0 pg   MCHC 34.0 30.0 - 36.0 g/dL   RDW 95.2 84.1 - 32.4 %   Platelets 235 150 - 400 K/uL  hCG, quantitative, pregnancy     Status: Abnormal   Collection Time: 01/03/17  2:13 PM  Result Value Ref Range   hCG, Beta Chain, Quant, S 57,021 (H) <5 mIU/mL       IMAGING US Ob Comp Less 14 Wks  Result Date: 01/03/2017 CLINICAL DATA:  Abdominal pain and nausea and vomiting in first trimester pregnancy. Gestational age by LMP of 6 weeks 4 days. EXAM: OBSTETRIC <14 WK [place] AND TRANSVAGINAL OB US TECHNIQUE: Both transabdominal and transvaginal ultrasound examinations were performed for complete evaluation of the gestation as well as the maternal uterus, adnexal regions, and pelvic cul-de-sac. Transvaginal technique was performed to assess  early pregnancy. COMPARISON:  None. FINDINGS: Intrauterine gestational sac: Single Yolk sac:  Visualized. Embryo:  Visualized. Cardiac Activity: Visualized. Heart Rate: 116  bpm CRL:  7  mm   6 w   3 D                  [place] EDC: 08/26/2017 Subchorionic hemorrhage:  None visualized. Maternal uterus/adnexae: Both ovaries are normal in appearance. No mass or free fluid identified. IMPRESSION: Single living IUP measuring 6 weeks 3 days, with [place] EDC of 08/26/2017. This is concordant  with LMP. No significant maternal uterine or adnexal abnormality identified. Electronically Signed   By: Myles Rosenthal M.D.   On: 01/03/2017 14:59   US Ob Transvaginal  Result Date: 01/03/2017 CLINICAL DATA:  Abdominal pain and nausea and vomiting in first trimester pregnancy. Gestational age by LMP of 6 weeks 4 days. EXAM: OBSTETRIC <14 WK [place] AND TRANSVAGINAL OB US TECHNIQUE: Both transabdominal and transvaginal ultrasound examinations were performed for complete evaluation of the gestation as well as the maternal uterus, adnexal regions, and pelvic cul-de-sac. Transvaginal technique was performed to assess early pregnancy. COMPARISON:  None. FINDINGS: Intrauterine gestational sac: Single Yolk sac:  Visualized. Embryo:  Visualized. Cardiac Activity: Visualized. Heart Rate: 116  bpm CRL:  7  mm   6 w   3 D                  [place] EDC: 08/26/2017 Subchorionic hemorrhage:  None visualized. Maternal uterus/adnexae: Both ovaries are normal in appearance. No mass or free fluid identified. IMPRESSION: Single living IUP measuring 6 weeks 3 days, with [place] EDC of 08/26/2017. This is concordant with LMP. No significant maternal uterine or adnexal abnormality identified. Electronically Signed   By: Myles Rosenthal M.D.   On: 01/03/2017 14:59    MAU Management/MDM: Ordered labs and [place] and reviewed results.  IUP noted to today's [place]. Symptoms likely viral gastroenteritis, may be compounded by n/v of pregnancy.  Phenergan 25 mg PO given in MAU, Rx for Phenergan  and Reglan for pt to take at home, Reglan in the daytime, Phenergan at night. List of safe OTC medications provided, discussed treatment for diarrhea if this returns.  F/U with early prenatal care and list of providers given. Pt stable at time of discharge.  ASSESSMENT 1. Normal IUP (intrauterine pregnancy) on prenatal ultrasound, first trimester   2. Abdominal pain during pregnancy in first trimester   3. Viral gastroenteritis   4. Nausea and vomiting during pregnancy prior to [redacted] weeks gestation     PLAN Discharge home Allergies as of 01/03/2017      Reactions   Latex Itching, Rash      Medication List    STOP taking these medications   PRENATE PIXIE 10-0.6-0.4-200 MG Caps     TAKE these medications   CITRANATAL 90 DHA 90-1 & 300 MG Misc Take 1 tablet by mouth daily.   metoCLOPramide 10 MG tablet Commonly known as:  REGLAN Take 1 tablet (10 mg total) by mouth 3 (three) times daily as needed for nausea.   promethazine 25 MG tablet Commonly known as:  PHENERGAN Take 0.5-1 tablets (12.5-25 mg total) by mouth at bedtime.            Discharge Care Instructions        Start     Ordered   01/03/17 0000  promethazine (PHENERGAN) 25 MG tablet  Daily at bedtime    Question:  Supervising Provider  Answer:  [place] Bing   01/03/17 1520   01/03/17 0000  metoCLOPramide (REGLAN) 10 MG tablet  3 times daily PRN    Question:  Supervising Provider  Answer:  [place] Bing   01/03/17 1520   01/03/17 0000  Discharge patient    Question Answer Comment  Discharge disposition 01-Home or Self Care   Discharge patient date 01/03/2017      01/03/17 1520     Follow-up Information    Department, [facility] Follow up.   Why:  Or prenatal provider of your choice,  see list provided. Return to MAU as needed for emergencies. Contact information: [address] [place] [address] [phone]           Sharen Counter Certified  Nurse-Midwife 01/03/2017  3:39 PM

## 2017-01-03 NOTE — Discharge Instructions (Signed)
[facility] Prov[facility]  ° ° °[facility]       Phone: [phone] ° °[facility] Phone: [phone] ° °[facility]  Phone: [phone] ° °[facility] [place]  Phone: [phone] ° °[facility]  Phone: [phone] ° °[facility]       Phone: [phone] ° °[facility]    Phone: [phone]  ° °[facility] ([place]    Phone: [phone] ° °[facility]    Phone: [phone] ° °[facility]    Phone: [phone] ° °[facility]    Phone: [phone] ° °[facility]       Phone: [phone]  ° °[facility]  Phone: [phone] ° °[facility]    Phone: [phone] ° °[facility]    Phone: [phone] ° °[facility]      Phone: [phone] ° °[facility]    Phone: [phone] ° °

## 2017-01-03 NOTE — MAU Note (Signed)
Pt missed her period last week, had pos HPT.  Ate pizza last night, woke up @ 0200 with vomiting & diarrhea.  Her husband is also sick.  Having intermittent abd pain, vomited 4-5 times, had 6 episodes of diarrhea.

## 2017-01-03 NOTE — MAU Provider Note (Signed)
History     CSN: [id number]  Arrival date and time: 01/03/17 1045   First Provider Initiated Contact with Patient 01/03/17 1322      Chief Complaint  Patient presents with  . Emesis  . Abdominal Pain  . Diarrhea   HPI Brittany Williams is a 21 y/o female G2P1001 with EGA of [redacted]w[redacted]d who presents to the MAU for evaluation of moderate sharp right suprapubic abdominal pain. Patient reports eating pizza yesterday, began feeling ill at 0200 hours this morning and had 5-6 episodes of diarrhea and vomiting throughout the night. She vomited around 0900 hours and had sudden onset of right and left lower abdominal pain worse on the right. Pain is described as a "quick stab" that comes and goes when she moves or walks. Nothing seems to make pain better. Has not been able to keep anything down secondary to nausea. Patient denies fever, vaginal bleeding or discharge. Boyfriend ate pizza and also having nausea, vomiting, and diarrhea.    Past Medical History:  Diagnosis Date  . Abnormal Pap smear   . Anxiety   . Asthma   . Chlamydia   . Gonorrhea   . Hypoglycemia   . Ovarian cyst     Past Surgical History:  Procedure Laterality Date  . APPENDECTOMY    . CYSTECTOMY    . WISDOM TOOTH EXTRACTION      Family History  Problem Relation Age of Onset  . Hypertension Other   . Diabetes Other   . Other Neg Hx     Social History  Substance Use Topics  . Smoking status: Former Smoker    Packs/day: 0.50    Types: Cigarettes    Quit date: 09/23/2014  . Smokeless tobacco: Never Used  . Alcohol use No    Allergies:  Allergies  Allergen Reactions  . Latex Itching and Rash    Prescriptions Prior to Admission  Medication Sig Dispense Refill Last Dose  . Prenat w/o A-FeCbGl-DSS-FA-DHA (CITRANATAL 90 DHA) 90-1 & 300 MG MISC Take 1 tablet by mouth daily. (Patient not taking: Reported on 08/29/2016) 60 each 6 Not Taking  . Prenat-FeAsp-Meth-FA-DHA w/o A (PRENATE PIXIE) 10-0.6-0.4-200 MG CAPS Take 1  tablet by mouth daily. 30 capsule 12     Review of Systems  Constitutional: Positive for chills. Negative for fever.  Respiratory: Negative for shortness of breath.   Cardiovascular: Negative for chest pain.  Gastrointestinal: Positive for abdominal pain, diarrhea, nausea and vomiting. Negative for constipation.  Genitourinary: Negative for dysuria, vaginal bleeding and vaginal discharge.  Neurological: Negative for dizziness.   Physical Exam   Blood pressure 104/66, pulse 77, temperature 98.3 F (36.8 C), temperature source Oral, resp. rate 16, height 5' (1.524 m), weight 75.8 kg (167 lb), last menstrual period 11/18/2016, not currently breastfeeding.  Physical Exam  Constitutional: She is oriented to person, place, and time. She appears well-developed and well-nourished. No distress.  HENT:  Head: Normocephalic and atraumatic.  Cardiovascular: Normal rate, regular rhythm and normal heart sounds.  Exam reveals no gallop and no friction rub.   No murmur heard. Respiratory: Effort normal and breath sounds normal. No respiratory distress. She has no wheezes. She has no rales.  GI: Soft. Normal appearance. She exhibits no mass. There is tenderness in the right lower quadrant. There is no rigidity, no rebound and no guarding.    Genitourinary: Vagina normal and uterus normal. There is no rash, lesion or injury on the right labia. There is no rash, lesion or injury  on the left labia. Cervix exhibits no motion tenderness, no discharge and no friability. Right adnexum displays no mass and no tenderness. Left adnexum displays no mass and no tenderness. No tenderness or bleeding in the vagina. No signs of injury around the vagina. No vaginal discharge found.  Neurological: She is alert and oriented to person, place, and time.  Skin: Skin is warm and dry.  Psychiatric: She has a normal mood and affect.    MAU Course  Procedures Results for orders placed or performed during the hospital  encounter of 01/03/17 (from the past 24 hour(s))  Urinalysis, Routine w reflex microscopic     Status: Abnormal   Collection Time: 01/03/17 11:42 AM  Result Value Ref Range   Color, Urine YELLOW YELLOW   APPearance HAZY (A) CLEAR   Specific Gravity, Urine 1.013 1.005 - 1.030   pH 8.0 5.0 - 8.0   Glucose, UA NEGATIVE NEGATIVE mg/dL   Hgb urine dipstick NEGATIVE NEGATIVE   Bilirubin Urine NEGATIVE NEGATIVE   Ketones, ur NEGATIVE NEGATIVE mg/dL   Protein, ur NEGATIVE NEGATIVE mg/dL   Nitrite NEGATIVE NEGATIVE   Leukocytes, UA SMALL (A) NEGATIVE   RBC / HPF 0-5 0 - 5 RBC/hpf   WBC, UA 0-5 0 - 5 WBC/hpf   Bacteria, UA RARE (A) NONE SEEN   Squamous Epithelial / LPF 6-30 (A) NONE SEEN   Mucus PRESENT    Sperm, UA PRESENT   Pregnancy, urine POC     Status: Abnormal   Collection Time: 01/03/17 11:55 AM  Result Value Ref Range   Preg Test, Ur POSITIVE (A) NEGATIVE  Wet prep, genital     Status: Abnormal   Collection Time: 01/03/17  1:48 PM  Result Value Ref Range   Yeast Wet Prep HPF POC NONE SEEN NONE SEEN   Trich, Wet Prep NONE SEEN NONE SEEN   Clue Cells Wet Prep HPF POC NONE SEEN NONE SEEN   WBC, Wet Prep HPF POC FEW (A) NONE SEEN   Sperm NONE SEEN     MDM I have reviewed all pertinent laboratory and imaging studies and have low suspicion of ectopic pregnancy. Transvaginal ultrasound showed viable intrauterine pregnancy with good fetal heart rate.   Assessment and Plan  1. Viable intrauterine pregnancy  2. Viral gastroenteritis    Discharge home.   Patient given list of providers to establish prenatal care and follow up  Rx phenergan 25 mg PO PRN for nausea/vomiting.   Return here should she develop vaginal bleeding or intractable pain   Dyke Maes Cimolino PA-S 01/03/2017, 1:58 PM   This visit, including physical exam, was done in my presence and I agree with  the above student's note.  Please see my complete note.  LEFTWICH-KIRBY, Dalma Panchal Certified Nurse-Midwife

## 2017-01-04 LAB — HIV ANTIBODY (ROUTINE TESTING W REFLEX): HIV Screen 4th Generation wRfx: NONREACTIVE

## 2017-01-04 LAB — GC/CHLAMYDIA PROBE AMP (~~LOC~~) NOT AT ARMC
CHLAMYDIA, DNA PROBE: NEGATIVE
NEISSERIA GONORRHEA: NEGATIVE

## 2017-01-16 ENCOUNTER — Inpatient Hospital Stay (HOSPITAL_COMMUNITY)
Admission: AD | Admit: 2017-01-16 | Discharge: 2017-01-16 | Discharge status: Against Medical Advice | Payer: Medicaid Other | Source: Ambulatory Visit | Attending: Obstetrics & Gynecology | Admitting: Obstetrics & Gynecology

## 2017-01-16 DIAGNOSIS — Z5321 Procedure and treatment not carried out due to patient leaving prior to being seen by health care provider: Secondary | ICD-10-CM | POA: Diagnosis not present

## 2017-01-16 DIAGNOSIS — R21 Rash and other nonspecific skin eruption: Secondary | ICD-10-CM | POA: Insufficient documentation

## 2017-01-16 NOTE — MAU Note (Signed)
Rash on breasts and neck for a month.? Ringworm.  Got some OTC med from pharmacy. Itches really bad, seems to be getting worse.

## 2017-01-16 NOTE — MAU Note (Signed)
#  1 not in lobby  

## 2017-01-16 NOTE — MAU Note (Signed)
1305 #2 not in lobby

## 2017-01-16 NOTE — MAU Note (Signed)
Not in lobby

## 2017-02-07 ENCOUNTER — Other Ambulatory Visit: Payer: Self-pay | Admitting: Certified Nurse Midwife

## 2017-02-07 ENCOUNTER — Ambulatory Visit (INDEPENDENT_AMBULATORY_CARE_PROVIDER_SITE_OTHER): Payer: Medicaid Other | Admitting: Certified Nurse Midwife

## 2017-02-07 ENCOUNTER — Encounter: Payer: Self-pay | Admitting: Certified Nurse Midwife

## 2017-02-07 ENCOUNTER — Other Ambulatory Visit (HOSPITAL_COMMUNITY)
Admission: RE | Admit: 2017-02-07 | Discharge: 2017-02-07 | Disposition: A | Discharge status: Home / Self Care | Payer: Medicaid Other | Source: Ambulatory Visit | Attending: Certified Nurse Midwife | Admitting: Certified Nurse Midwife

## 2017-02-07 VITALS — BP 107/66 | HR 80 | Wt 166.0 lb

## 2017-02-07 DIAGNOSIS — O0990 Supervision of high risk pregnancy, unspecified, unspecified trimester: Secondary | ICD-10-CM | POA: Insufficient documentation

## 2017-02-07 DIAGNOSIS — Z3A00 Weeks of gestation of pregnancy not specified: Secondary | ICD-10-CM | POA: Diagnosis not present

## 2017-02-07 DIAGNOSIS — Z3480 Encounter for supervision of other normal pregnancy, unspecified trimester: Secondary | ICD-10-CM | POA: Diagnosis present

## 2017-02-07 DIAGNOSIS — O219 Vomiting of pregnancy, unspecified: Secondary | ICD-10-CM

## 2017-02-07 DIAGNOSIS — Z3481 Encounter for supervision of other normal pregnancy, first trimester: Secondary | ICD-10-CM

## 2017-02-07 MED ORDER — PRENATE PIXIE 10-0.6-0.4-200 MG PO CAPS: 1.0000 | ORAL_CAPSULE | Freq: Every day | ORAL | 12 refills | Status: DC

## 2017-02-07 MED ORDER — DOXYLAMINE-PYRIDOXINE 10-10 MG PO TBEC: DELAYED_RELEASE_TABLET | ORAL | 4 refills | Status: DC

## 2017-02-07 NOTE — Progress Notes (Signed)
Subjective:    Brittany Williams is being seen today for her first obstetrical visit.  This is a planned pregnancy. She is at [redacted]w[redacted]d gestation. Her obstetrical history is significant for none. Relationship with FOB: spouse, living together. Patient does intend to breast feed. Pregnancy history fully reviewed.  The information documented in the HPI was reviewed and verified.  Menstrual History: OB History    Gravida Para Term Preterm AB Living   2 1 1     1    SAB TAB Ectopic Multiple Live Births         0 1       Patient's last menstrual period was 11/18/2016.    Past Medical History:  Diagnosis Date  . Abnormal Pap smear   . Anxiety   . Asthma   . Chlamydia   . Gonorrhea   . Hypoglycemia   . Ovarian cyst     Past Surgical History:  Procedure Laterality Date  . APPENDECTOMY    . CYSTECTOMY    . WISDOM TOOTH EXTRACTION       (Not in a hospital admission) Allergies  Allergen Reactions  . Latex Itching and Rash    Social History  Substance Use Topics  . Smoking status: Former Smoker    Packs/day: 0.50    Types: Cigarettes    Quit date: 09/23/2014  . Smokeless tobacco: Never Used  . Alcohol use No    Family History  Problem Relation Age of Onset  . Hypertension Other   . Diabetes Other   . Other Neg Hx      Review of Systems Constitutional: negative for weight loss Gastrointestinal: + for nausea & vomiting Genitourinary:negative for genital lesions and vaginal discharge and dysuria Musculoskeletal:negative for back pain Behavioral/Psych: negative for abusive relationship, depression, illegal drug usage and tobacco use    Objective:    BP 107/66   Pulse 80   Wt 166 lb (75.3 kg)   LMP 11/18/2016   BMI 32.42 kg/m  General Appearance:    Alert, cooperative, no distress, appears stated age  Head:    Normocephalic, without obvious abnormality, atraumatic  Eyes:    PERRL, conjunctiva/corneas clear, EOM's intact, fundi    benign, both eyes  Ears:    Normal TM's  and external ear canals, both ears  Nose:   Nares normal, septum midline, mucosa normal, no drainage    or sinus tenderness  Throat:   Lips, mucosa, and tongue normal; teeth and gums normal  Neck:   Supple, symmetrical, trachea midline, no adenopathy;    thyroid:  no enlargement/tenderness/nodules; no carotid   bruit or JVD  Back:     Symmetric, no curvature, ROM normal, no CVA tenderness  Lungs:     Clear to auscultation bilaterally, respirations unlabored  Chest Wall:    No tenderness or deformity   Heart:    Regular rate and rhythm, S1 and S2 normal, no murmur, rub   or gallop  Breast Exam:    No tenderness, masses, or nipple abnormality  Abdomen:     Soft, non-tender, bowel sounds active all four quadrants,    no masses, no organomegaly  Genitalia:    Normal female without lesion, discharge or tenderness  Extremities:   Extremities normal, atraumatic, no cyanosis or edema  Pulses:   2+ and symmetric all extremities  Skin:   Skin color, texture, turgor normal, no rashes or lesions  Lymph nodes:   Cervical, supraclavicular, and axillary nodes normal  Neurologic:  CNII-XII intact, normal strength, sensation and reflexes    throughout          Cervix:  Long, thick, closed and posterior.  FHR: 165 by doppler.  Size c/w dates.  Lab Review Urine pregnancy test Labs reviewed yes Radiologic studies reviewed no  Assessment & Plan    Pregnancy at 5425w3d weeks    1. Supervision of other normal pregnancy, antepartum      - Cervicovaginal ancillary only - Obstetric Panel, Including HIV - Hemoglobinopathy evaluation - HgB A1c - Vitamin D (25 hydroxy) - Culture, OB Urine - Varicella zoster antibody, IgG - MaterniT21 PLUS Core+SCA - US MFM OB COMP + 14 WK; Future - Prenat-FeAsp-Meth-FA-DHA w/o A (PRENATE PIXIE) 10-0.6-0.4-200 MG CAPS; Take 1 tablet by mouth daily.  Dispense: 30 capsule; Refill: 12  2. Nausea/vomiting in pregnancy     - Doxylamine-Pyridoxine (DICLEGIS) 10-10 MG TBEC;  Take 1 tablet with breakfast and lunch.  Take 2 tablets at bedtime.  Dispense: 100 tablet; Refill: 4     Prenatal vitamins.  Counseling provided regarding continued use of seat belts, cessation of alcohol consumption, smoking or use of illicit drugs; infection precautions i.e., influenza/TDAP immunizations, toxoplasmosis,CMV, parvovirus, listeria and varicella; workplace safety, exercise during pregnancy; routine dental care, safe medications, sexual activity, hot tubs, saunas, pools, travel, caffeine use, fish and methlymercury, potential toxins, hair treatments, varicose veins Weight gain recommendations per IOM guidelines reviewed: underweight/BMI< 18.5--> gain 28 - 40 lbs; normal weight/BMI 18.5 - 24.9--> gain 25 - 35 lbs; overweight/BMI 25 - 29.9--> gain 15 - 25 lbs; obese/BMI >30->gain  11 - 20 lbs Problem list reviewed and updated. FIRST/CF mutation testing/NIPT/QUAD SCREEN/fragile X/Ashkenazi Jewish population testing/Spinal muscular atrophy discussed: ordered. Role of ultrasound in pregnancy discussed; fetal survey: ordered. Amniocentesis discussed: not indicated.  No orders of the defined types were placed in this encounter.  Orders Placed This Encounter  Procedures  . Culture, OB Urine  . Obstetric Panel, Including HIV  . Hemoglobinopathy evaluation  . HgB A1c  . Vitamin D (25 hydroxy)  . Varicella zoster antibody, IgG  . MaterniT21 PLUS Core+SCA    Order Specific Question:   Weight (lbs)    Answer:   166    Order Specific Question:   Gestational Age (GA), weeks    Answer:   11.3    Order Specific Question:   Date on which patient was at this GA    Answer:   02/07/2017    Order Specific Question:   GA Calculation Method    Answer:   Ultrasound    Order Specific Question:   Number of fetuses    Answer:   1    Follow up in 4 weeks. 50% of 30 min visit spent on counseling and coordination of care.

## 2017-02-07 NOTE — Progress Notes (Signed)
Patient is in the office for initial ob visit, no complaints. 

## 2017-02-09 LAB — URINE CULTURE, OB REFLEX

## 2017-02-09 LAB — CULTURE, OB URINE

## 2017-02-11 LAB — OBSTETRIC PANEL, INCLUDING HIV
Antibody Screen: NEGATIVE
BASOS ABS: 0 10*3/uL (ref 0.0–0.2)
Basos: 0 %
EOS (ABSOLUTE): 0.2 10*3/uL (ref 0.0–0.4)
Eos: 2 %
HIV Screen 4th Generation wRfx: NONREACTIVE
Hematocrit: 41.2 % (ref 34.0–46.6)
Hemoglobin: 14.3 g/dL (ref 11.1–15.9)
Hepatitis B Surface Ag: NEGATIVE
Immature Grans (Abs): 0 10*3/uL (ref 0.0–0.1)
Immature Granulocytes: 0 %
Lymphocytes Absolute: 2.8 10*3/uL (ref 0.7–3.1)
Lymphs: 21 %
MCH: 30.4 pg (ref 26.6–33.0)
MCHC: 34.7 g/dL (ref 31.5–35.7)
MCV: 88 fL (ref 79–97)
MONOCYTES: 8 %
Monocytes Absolute: 1 10*3/uL — ABNORMAL HIGH (ref 0.1–0.9)
NEUTROS ABS: 9.2 10*3/uL — AB (ref 1.4–7.0)
NEUTROS PCT: 69 %
PLATELETS: 256 10*3/uL (ref 150–379)
RBC: 4.7 x10E6/uL (ref 3.77–5.28)
RDW: 14.3 % (ref 12.3–15.4)
RPR Ser Ql: NONREACTIVE
Rh Factor: POSITIVE
Rubella Antibodies, IGG: 4.16 index (ref >0.99)
WBC: 13.3 10*3/uL — ABNORMAL HIGH (ref 3.4–10.8)

## 2017-02-11 LAB — VARICELLA ZOSTER ANTIBODY, IGG: Varicella zoster IgG: 2429 index (ref >165)

## 2017-02-11 LAB — HEMOGLOBINOPATHY EVALUATION
HEMOGLOBIN A2 QUANTITATION: 2.6 % (ref 1.8–3.2)
HEMOGLOBIN F QUANTITATION: 0 % (ref 0.0–2.0)
HGB A: 97.4 % (ref 96.4–98.8)
HGB C: 0 %
HGB S: 0 %
HGB VARIANT: 0 %

## 2017-02-11 LAB — CERVICOVAGINAL ANCILLARY ONLY
BACTERIAL VAGINITIS: NEGATIVE
CANDIDA VAGINITIS: NEGATIVE
CHLAMYDIA, DNA PROBE: NEGATIVE
NEISSERIA GONORRHEA: NEGATIVE
TRICH (WINDOWPATH): NEGATIVE

## 2017-02-11 LAB — HEMOGLOBIN A1C
ESTIMATED AVERAGE GLUCOSE: 103 mg/dL
Hgb A1c MFr Bld: 5.2 % (ref 4.8–5.6)

## 2017-02-11 LAB — VITAMIN D 25 HYDROXY (VIT D DEFICIENCY, FRACTURES): Vit D, 25-Hydroxy: 33.3 ng/mL (ref 30.0–100.0)

## 2017-02-12 ENCOUNTER — Other Ambulatory Visit: Payer: Self-pay | Admitting: Certified Nurse Midwife

## 2017-02-12 DIAGNOSIS — Z3480 Encounter for supervision of other normal pregnancy, unspecified trimester: Secondary | ICD-10-CM

## 2017-02-12 LAB — MATERNIT21 PLUS CORE+SCA
CHROMOSOME 18: NEGATIVE
Chromosome 13: NEGATIVE
Chromosome 21: NEGATIVE
Y CHROMOSOME: NOT DETECTED

## 2017-02-19 LAB — INHERITEST CORE(CF97,SMA,FRAX)

## 2017-02-21 ENCOUNTER — Other Ambulatory Visit: Payer: Self-pay | Admitting: Certified Nurse Midwife

## 2017-02-21 DIAGNOSIS — Z3480 Encounter for supervision of other normal pregnancy, unspecified trimester: Secondary | ICD-10-CM

## 2017-03-05 ENCOUNTER — Other Ambulatory Visit: Payer: Self-pay

## 2017-03-05 ENCOUNTER — Ambulatory Visit (INDEPENDENT_AMBULATORY_CARE_PROVIDER_SITE_OTHER): Payer: Medicaid Other | Admitting: Certified Nurse Midwife

## 2017-03-05 DIAGNOSIS — Z3480 Encounter for supervision of other normal pregnancy, unspecified trimester: Secondary | ICD-10-CM

## 2017-03-05 NOTE — Progress Notes (Signed)
Patient said she had Ring Worms in the early stages of her pregnancy, she treated it with antifungal cream.

## 2017-03-05 NOTE — Progress Notes (Signed)
   PRENATAL VISIT NOTE  Subjective:  Brittany Williams is a 21 y.o. G2P1001 at 1764w1d being seen today for ongoing prenatal care.  She is currently monitored for the following issues for this low-risk pregnancy and has Supervision of other normal pregnancy, antepartum on their problem list.  Patient reports nausea, no bleeding, no contractions, no cramping and no leaking.  Contractions: Not present. Vag. Bleeding: None.  Movement: Present. Denies leaking of fluid.   The following portions of the patient's history were reviewed and updated as appropriate: allergies, current medications, past family history, past medical history, past social history, past surgical history and problem list. Problem list updated.  Objective:   Vitals:   03/05/17 1115  BP: 129/70  Pulse: 92  Weight: 163 lb 12.8 oz (74.3 kg)    Fetal Status: Fetal Heart Rate (bpm): 154; doppler   Movement: Present     General:  Alert, oriented and cooperative. Patient is in no acute distress.  Skin: Skin is warm and dry. No rash noted.   Cardiovascular: Normal heart rate noted  Respiratory: Normal respiratory effort, no problems with respiration noted  Abdomen: Soft, gravid, appropriate for gestational age.  Pain/Pressure: Absent     Pelvic: Cervical exam deferred        Extremities: Normal range of motion.  Edema: None  Mental Status:  Normal mood and affect. Normal behavior. Normal judgment and thought content.   Assessment and Plan:  Pregnancy: G2P1001 at 2764w1d  1. Supervision of other normal pregnancy, antepartum     Doing well.  3 lb weight gain this pregnancy - AFP, Serum, Open Spina Bifida  Preterm labor symptoms and general obstetric precautions including but not limited to vaginal bleeding, contractions, leaking of fluid and fetal movement were reviewed in detail with the patient. Please refer to After Visit Summary for other counseling recommendations.  Return in about 4 weeks (around 04/02/2017) for  ROB.   Roe Coombsachelle A Madden Piazza, CNM

## 2017-03-09 LAB — AFP, SERUM, OPEN SPINA BIFIDA
AFP MoM: 1.03
AFP Value: 27.3 ng/mL
GEST. AGE ON COLLECTION DATE: 15.1 wk
Maternal Age At EDD: 23.6 yr
OSBR RISK 1 IN: 10000
TEST RESULTS AFP: NEGATIVE
Weight: 164 [lb_av]

## 2017-03-14 ENCOUNTER — Other Ambulatory Visit: Payer: Self-pay | Admitting: Certified Nurse Midwife

## 2017-03-14 DIAGNOSIS — Z3480 Encounter for supervision of other normal pregnancy, unspecified trimester: Secondary | ICD-10-CM

## 2017-03-21 ENCOUNTER — Encounter (HOSPITAL_COMMUNITY): Payer: Self-pay | Admitting: Certified Nurse Midwife

## 2017-04-01 ENCOUNTER — Other Ambulatory Visit: Payer: Self-pay | Admitting: Certified Nurse Midwife

## 2017-04-01 ENCOUNTER — Ambulatory Visit (HOSPITAL_COMMUNITY)
Admission: RE | Admit: 2017-04-01 | Discharge: 2017-04-01 | Disposition: A | Discharge status: Home / Self Care | Payer: Medicaid Other | Source: Ambulatory Visit | Attending: Certified Nurse Midwife | Admitting: Certified Nurse Midwife

## 2017-04-01 DIAGNOSIS — Z3689 Encounter for other specified antenatal screening: Secondary | ICD-10-CM

## 2017-04-01 DIAGNOSIS — Z3480 Encounter for supervision of other normal pregnancy, unspecified trimester: Secondary | ICD-10-CM

## 2017-04-02 ENCOUNTER — Encounter: Payer: Self-pay | Admitting: Certified Nurse Midwife

## 2017-04-02 ENCOUNTER — Other Ambulatory Visit: Payer: Self-pay

## 2017-04-02 ENCOUNTER — Ambulatory Visit (INDEPENDENT_AMBULATORY_CARE_PROVIDER_SITE_OTHER): Payer: Medicaid Other | Admitting: Certified Nurse Midwife

## 2017-04-02 VITALS — BP 113/61 | HR 81 | Wt 166.4 lb

## 2017-04-02 DIAGNOSIS — Z3480 Encounter for supervision of other normal pregnancy, unspecified trimester: Secondary | ICD-10-CM

## 2017-04-02 NOTE — Progress Notes (Signed)
ROB with no concerns today. Pt had Anatomy scan 04/01/17 Declines Flu vaccine.

## 2017-04-02 NOTE — Progress Notes (Signed)
CMA worked up patient for appt, patient left without being seen by provider, stated that she would call back to re-schedule.

## 2017-04-04 ENCOUNTER — Other Ambulatory Visit: Payer: Self-pay | Admitting: Certified Nurse Midwife

## 2017-04-04 DIAGNOSIS — Z3480 Encounter for supervision of other normal pregnancy, unspecified trimester: Secondary | ICD-10-CM

## 2017-04-30 ENCOUNTER — Encounter: Payer: Self-pay | Admitting: Certified Nurse Midwife

## 2017-04-30 ENCOUNTER — Ambulatory Visit (INDEPENDENT_AMBULATORY_CARE_PROVIDER_SITE_OTHER): Payer: Medicaid Other | Admitting: Certified Nurse Midwife

## 2017-04-30 DIAGNOSIS — Z3482 Encounter for supervision of other normal pregnancy, second trimester: Secondary | ICD-10-CM

## 2017-04-30 DIAGNOSIS — Z3480 Encounter for supervision of other normal pregnancy, unspecified trimester: Secondary | ICD-10-CM

## 2017-04-30 NOTE — Patient Instructions (Addendum)
Third Trimester of Pregnancy The third trimester is from week 29 through week 42, months 7 through 9. This trimester is when your unborn baby (fetus) is growing very fast. At the end of the ninth month, the unborn baby is about 20 inches in length. It weighs about 6-10 pounds. Follow these instructions at home:  Avoid all smoking, herbs, and alcohol. Avoid drugs not approved by your doctor.  Do not use any tobacco products, including cigarettes, chewing tobacco, and electronic cigarettes. If you need help quitting, ask your doctor. You may get counseling or other support to help you quit.  Only take medicine as told by your doctor. Some medicines are safe and some are not during pregnancy.  Exercise only as told by your doctor. Stop exercising if you start having cramps.  Eat regular, healthy meals.  Wear a good support bra if your breasts are tender.  Do not use hot tubs, steam rooms, or saunas.  Wear your seat belt when driving.  Avoid raw meat, uncooked cheese, and liter boxes and soil used by cats.  Take your prenatal vitamins.  Take 1500-2000 milligrams of calcium daily starting at the 20th week of pregnancy until you deliver your baby.  Try taking medicine that helps you poop (stool softener) as needed, and if your doctor approves. Eat more fiber by eating fresh fruit, vegetables, and whole grains. Drink enough fluids to keep your pee (urine) clear or pale yellow.  Take warm water baths (sitz baths) to soothe pain or discomfort caused by hemorrhoids. Use hemorrhoid cream if your doctor approves.  If you have puffy, bulging veins (varicose veins), wear support hose. Raise (elevate) your feet for 15 minutes, 3-4 times a day. Limit salt in your diet.  Avoid heavy lifting, wear low heels, and sit up straight.  Rest with your legs raised if you have leg cramps or low back pain.  Visit your dentist if you have not gone during your pregnancy. Use a soft toothbrush to brush your  teeth. Be gentle when you floss.  You can have sex (intercourse) unless your doctor tells you not to.  Do not travel far distances unless you must. Only do so with your doctor's approval.  Take prenatal classes.  Practice driving to the hospital.  Pack your hospital bag.  Prepare the baby's room.  Go to your doctor visits. Get help if:  You are not sure if you are in labor or if your water has broken.  You are dizzy.  You have mild cramps or pressure in your lower belly (abdominal).  You have a nagging pain in your belly area.  You continue to feel sick to your stomach (nauseous), throw up (vomit), or have watery poop (diarrhea).  You have bad smelling fluid coming from your vagina.  You have pain with peeing (urination). Get help right away if:  You have a fever.  You are leaking fluid from your vagina.  You are spotting or bleeding from your vagina.  You have severe belly cramping or pain.  You lose or gain weight rapidly.  You have trouble catching your breath and have chest pain.  You notice sudden or extreme puffiness (swelling) of your face, hands, ankles, feet, or legs.  You have not felt the baby move in over an hour.  You have severe headaches that do not go away with medicine.  You have vision changes. This information is not intended to replace advice given to you by your health care provider. Make   sure you discuss any questions you have with your health care provider. Document Released: 06/27/2009 Document Revised: 09/08/2015 Document Reviewed: 06/03/2012 Elsevier Interactive Patient Education  2017 [place]. Glucose Tolerance Test During Pregnancy The glucose tolerance test (GTT) is a blood test used to determine if you have developed a type of diabetes during pregnancy (gestational diabetes). This is when your body does not properly process sugar (glucose) in the food you eat, resulting in high blood glucose levels. Typically, a GTT is done  after you have had a 1-hour glucose test with results that indicate you possibly have gestational diabetes. It may also be done if:  You have a history of giving birth to very large babies or have experienced repeated fetal loss (stillbirth).  You have signs and symptoms of diabetes, such as: ? Changes in your vision. ? Tingling or numbness in your hands or feet. ? Changes in hunger, thirst, and urination not otherwise explained by your pregnancy.  The GTT lasts about 3 hours. You will be given a sugar-water solution to drink at the beginning of the test. You will have blood drawn before you drink the solution and then again 1, 2, and 3 hours after you drink it. You will not be allowed to eat or drink anything else during the test. You must remain at the testing location to make sure that your blood is drawn on time. You should also avoid exercising during the test, because exercise can alter test results. How do I prepare for this test? Eat normally for 3 days prior to the GTT test, including having plenty of carbohydrate-rich foods. Do not eat or drink anything except water during the final 12 hours before the test. In addition, your health care provider may ask you to stop taking certain medicines before the test. What do the results mean? It is your responsibility to obtain your test results. Ask the lab or department performing the test when and how you will get your results. Contact your health care provider to discuss any questions you have about your results. Range of Normal Values Ranges for normal values may vary among different labs and hospitals. You should always check with your health care provider after having lab work or other tests done to discuss whether your values are considered within normal limits. Normal levels of blood glucose are as follows:  Fasting: less than 105 mg/dL.  1 hour after drinking the solution: less than 190 mg/dL.  2 hours after drinking the solution:  less than 165 mg/dL.  3 hours after drinking the solution: less than 145 mg/dL.  Some substances can interfere with GTT results. These may include:  Blood pressure and heart failure medicines, including beta blockers, furosemide, and thiazides.  Anti-inflammatory medicines, including aspirin.  Nicotine.  Some psychiatric medicines.  Meaning of Results Outside Normal Value Ranges GTT test results that are above normal values may indicate a number of health problems, such as:  Gestational diabetes.  Acute stress response.  Cushing syndrome.  Tumors such as pheochromocytoma or glucagonoma.  Long-term kidney problems.  Pancreatitis.  Hyperthyroidism.  Current infection.  Discuss your test results with your health care provider. He or she will use the results to make a diagnosis and determine a treatment plan that is right for you. This information is not intended to replace advice given to you by your health care provider. Make sure you discuss any questions you have with your health care provider. Document Released: 10/02/2011 Document Revised: 09/08/2015 Document Reviewed:  08/07/2013 Elsevier Interactive Patient Education  [employer]2018 [employer] Inc[employer]

## 2017-04-30 NOTE — Progress Notes (Signed)
Pt c/o near syncopal episode at work last week, felt better after eating.

## 2017-04-30 NOTE — Progress Notes (Signed)
   PRENATAL VISIT NOTE  Subjective:  Brittany Williams is a 22 y.o. G2P1001 at 316w1d being seen today for ongoing prenatal care.  She is currently monitored for the following issues for this low-risk pregnancy and has Supervision of other normal pregnancy, antepartum on their problem list.  Patient reports no complaints.  Contractions: Not present. Vag. Bleeding: None.  Movement: Present. Denies leaking of fluid.   The following portions of the patient's history were reviewed and updated as appropriate: allergies, current medications, past family history, past medical history, past social history, past surgical history and problem list. Problem list updated.  Objective:   Vitals:   04/30/17 0937  BP: 117/77  Pulse: (!) 114  Weight: 169 lb 9.6 oz (76.9 kg)    Fetal Status: Fetal Heart Rate (bpm): 155; doppler Fundal Height: 23 cm Movement: Present     General:  Alert, oriented and cooperative. Patient is in no acute distress.  Skin: Skin is warm and dry. No rash noted.   Cardiovascular: Normal heart rate noted  Respiratory: Normal respiratory effort, no problems with respiration noted  Abdomen: Soft, gravid, appropriate for gestational age.  Pain/Pressure: Absent     Pelvic: Cervical exam deferred        Extremities: Normal range of motion.  Edema: None  Mental Status:  Normal mood and affect. Normal behavior. Normal judgment and thought content.   Assessment and Plan:  Pregnancy: G2P1001 at 366w1d  1. Supervision of other normal pregnancy, antepartum     Doing well.   Preterm labor symptoms and general obstetric precautions including but not limited to vaginal bleeding, contractions, leaking of fluid and fetal movement were reviewed in detail with the patient. Please refer to After Visit Summary for other counseling recommendations.  Return in about 4 weeks (around 05/28/2017) for ROB, 2 hr OGTT.   Roe Coombsachelle A Denney, CNM

## 2017-05-28 ENCOUNTER — Ambulatory Visit (INDEPENDENT_AMBULATORY_CARE_PROVIDER_SITE_OTHER): Payer: Medicaid Other | Admitting: Certified Nurse Midwife

## 2017-05-28 ENCOUNTER — Other Ambulatory Visit: Payer: Medicaid Other

## 2017-05-28 ENCOUNTER — Other Ambulatory Visit: Payer: Self-pay

## 2017-05-28 VITALS — BP 104/54 | HR 103 | Wt 172.2 lb

## 2017-05-28 DIAGNOSIS — Z3482 Encounter for supervision of other normal pregnancy, second trimester: Secondary | ICD-10-CM

## 2017-05-28 DIAGNOSIS — Z3480 Encounter for supervision of other normal pregnancy, unspecified trimester: Secondary | ICD-10-CM

## 2017-05-28 DIAGNOSIS — Z23 Encounter for immunization: Secondary | ICD-10-CM

## 2017-05-28 NOTE — Progress Notes (Signed)
Subjective:    Brittany Williams is a 22 y.o. female being seen today for her obstetrical visit. She is at 2343w1d gestation. Patient reports: no complaints . Fetal movement: normal. Denies leaking, bleeding, or contractions.    Problem List Items Addressed This Visit      Other   Supervision of other normal pregnancy, antepartum - Primary   Relevant Orders   Glucose Tolerance, 2 Hours w/1 Hour   HIV antibody   RPR   CBC   Tdap vaccine greater than or equal to 7yo IM (Completed)     Patient Active Problem List   Diagnosis Date Noted  . Supervision of other normal pregnancy, antepartum 02/07/2017   Objective:    BP (!) 104/54   Pulse (!) 103   Wt 172 lb 3.2 oz (78.1 kg)   LMP 11/18/2016   BMI 33.63 kg/m  FHT: 139 BPM  Uterine Size: 28 cm     Assessment:    Pregnancy @ 3043w1d    Plan:    Signs and symptoms of preterm labor: discussed.  Labs, problem list reviewed and updated 2 hr GTT today Tdap given 05/28/17 Follow up in 2 weeks.

## 2017-05-28 NOTE — Progress Notes (Signed)
   PRENATAL VISIT NOTE  Subjective:  Brittany Williams is a 22 y.o. G2P1001 at 4968w1d being seen today for ongoing prenatal care.  She is currently monitored for the following issues for this low-risk pregnancy and has Supervision of other normal pregnancy, antepartum on their problem list.  Patient reports no complaints.  Contractions: Not present. Vag. Bleeding: None.  Movement: Present. Denies leaking of fluid.   The following portions of the patient's history were reviewed and updated as appropriate: allergies, current medications, past family history, past medical history, past social history, past surgical history and problem list. Problem list updated.  Objective:   Vitals:   05/28/17 0903  BP: (!) 104/54  Pulse: (!) 103  Weight: 172 lb 3.2 oz (78.1 kg)    Fetal Status: Fetal Heart Rate (bpm): 139;doppler Fundal Height: 28 cm Movement: Present     General:  Alert, oriented and cooperative. Patient is in no acute distress.  Skin: Skin is warm and dry. No rash noted.   Cardiovascular: Normal heart rate noted  Respiratory: Normal respiratory effort, no problems with respiration noted  Abdomen: Soft, gravid, appropriate for gestational age.  Pain/Pressure: Absent     Pelvic: Cervical exam deferred        Extremities: Normal range of motion.  Edema: None  Mental Status:  Normal mood and affect. Normal behavior. Normal judgment and thought content.   Assessment and Plan:  Pregnancy: G2P1001 at 6768w1d  1. Supervision of other normal pregnancy, antepartum      Doing well - Glucose Tolerance, 2 Hours w/1 Hour - HIV antibody - RPR - CBC - Tdap vaccine greater than or equal to 7yo IM  Preterm labor symptoms and general obstetric precautions including but not limited to vaginal bleeding, contractions, leaking of fluid and fetal movement were reviewed in detail with the patient. Please refer to After Visit Summary for other counseling recommendations.  Return in about 2 weeks (around  06/11/2017) for ROB.   Roe Coombsachelle A Myia Bergh, CNM

## 2017-05-29 LAB — HIV ANTIBODY (ROUTINE TESTING W REFLEX): HIV Screen 4th Generation wRfx: NONREACTIVE

## 2017-05-29 LAB — CBC
HEMOGLOBIN: 13.4 g/dL (ref 11.1–15.9)
Hematocrit: 39.4 % (ref 34.0–46.6)
MCH: 30.6 pg (ref 26.6–33.0)
MCHC: 34 g/dL (ref 31.5–35.7)
MCV: 90 fL (ref 79–97)
Platelets: 232 10*3/uL (ref 150–379)
RBC: 4.38 x10E6/uL (ref 3.77–5.28)
RDW: 13.9 % (ref 12.3–15.4)
WBC: 14.9 10*3/uL — ABNORMAL HIGH (ref 3.4–10.8)

## 2017-05-29 LAB — GLUCOSE TOLERANCE, 2 HOURS W/ 1HR
Glucose, 1 hour: 188 mg/dL — ABNORMAL HIGH (ref 65–179)
Glucose, 2 hour: 136 mg/dL (ref 65–152)
Glucose, Fasting: 83 mg/dL (ref 65–91)

## 2017-05-29 LAB — RPR: RPR Ser Ql: NONREACTIVE

## 2017-06-01 ENCOUNTER — Other Ambulatory Visit: Payer: Self-pay | Admitting: Certified Nurse Midwife

## 2017-06-01 DIAGNOSIS — O09299 Supervision of pregnancy with other poor reproductive or obstetric history, unspecified trimester: Secondary | ICD-10-CM

## 2017-06-01 DIAGNOSIS — Z3480 Encounter for supervision of other normal pregnancy, unspecified trimester: Secondary | ICD-10-CM

## 2017-06-01 DIAGNOSIS — O24419 Gestational diabetes mellitus in pregnancy, unspecified control: Secondary | ICD-10-CM

## 2017-06-01 DIAGNOSIS — Z8632 Personal history of gestational diabetes: Secondary | ICD-10-CM | POA: Insufficient documentation

## 2017-06-01 HISTORY — DX: Supervision of pregnancy with other poor reproductive or obstetric history, unspecified trimester: O09.299

## 2017-06-03 ENCOUNTER — Other Ambulatory Visit: Payer: Self-pay

## 2017-06-03 DIAGNOSIS — O24419 Gestational diabetes mellitus in pregnancy, unspecified control: Secondary | ICD-10-CM

## 2017-06-03 MED ORDER — ACCU-CHEK FASTCLIX LANCETS MISC: 1.0000 | Freq: Four times a day (QID) | 12 refills | Status: DC

## 2017-06-03 MED ORDER — ACCU-CHEK GUIDE W/DEVICE KIT: 1.0000 | PACK | Freq: Four times a day (QID) | 0 refills | Status: DC

## 2017-06-12 ENCOUNTER — Ambulatory Visit (INDEPENDENT_AMBULATORY_CARE_PROVIDER_SITE_OTHER): Payer: Medicaid Other | Admitting: Certified Nurse Midwife

## 2017-06-12 ENCOUNTER — Ambulatory Visit (HOSPITAL_COMMUNITY): Admission: RE | Admit: 2017-06-12 | Payer: Medicaid Other | Source: Ambulatory Visit

## 2017-06-12 DIAGNOSIS — O24419 Gestational diabetes mellitus in pregnancy, unspecified control: Secondary | ICD-10-CM

## 2017-06-12 NOTE — Progress Notes (Signed)
Pt checked in @ 0941 and told front staff at 0956 that she did not want to wait. She requested another appt for later this week. When offered a Friday appt @ 1115 pt decided that she would just rather come back in 2 weeks.

## 2017-06-13 ENCOUNTER — Ambulatory Visit (HOSPITAL_COMMUNITY)
Admission: RE | Admit: 2017-06-13 | Discharge: 2017-06-13 | Disposition: A | Discharge status: Home / Self Care | Payer: Medicaid Other | Source: Ambulatory Visit | Attending: Certified Nurse Midwife | Admitting: Certified Nurse Midwife

## 2017-06-13 ENCOUNTER — Encounter (HOSPITAL_COMMUNITY): Payer: Self-pay

## 2017-06-13 ENCOUNTER — Other Ambulatory Visit: Payer: Self-pay | Admitting: Certified Nurse Midwife

## 2017-06-13 DIAGNOSIS — O24419 Gestational diabetes mellitus in pregnancy, unspecified control: Secondary | ICD-10-CM

## 2017-06-13 DIAGNOSIS — O24410 Gestational diabetes mellitus in pregnancy, diet controlled: Secondary | ICD-10-CM | POA: Diagnosis present

## 2017-06-13 DIAGNOSIS — Z3A29 29 weeks gestation of pregnancy: Secondary | ICD-10-CM

## 2017-06-13 DIAGNOSIS — Z362 Encounter for other antenatal screening follow-up: Secondary | ICD-10-CM | POA: Diagnosis not present

## 2017-06-16 ENCOUNTER — Encounter (HOSPITAL_COMMUNITY): Payer: Self-pay | Admitting: *Deleted

## 2017-06-16 ENCOUNTER — Ambulatory Visit (HOSPITAL_COMMUNITY)
Admission: EM | Admit: 2017-06-16 | Discharge: 2017-06-16 | Disposition: A | Discharge status: Home / Self Care | Payer: Medicaid Other | Attending: Internal Medicine | Admitting: Internal Medicine

## 2017-06-16 ENCOUNTER — Other Ambulatory Visit: Payer: Self-pay

## 2017-06-16 DIAGNOSIS — J00 Acute nasopharyngitis [common cold]: Secondary | ICD-10-CM | POA: Insufficient documentation

## 2017-06-16 LAB — POCT RAPID STREP A: Streptococcus, Group A Screen (Direct): NEGATIVE

## 2017-06-16 MED ORDER — CROMOLYN SODIUM 5.2 MG/ACT NA AERS: 1.0000 | INHALATION_SPRAY | Freq: Four times a day (QID) | NASAL | 0 refills | Status: DC

## 2017-06-16 MED ORDER — CROMOLYN SODIUM 5.2 MG/ACT NA AERS: 1.0000 | INHALATION_SPRAY | Freq: Four times a day (QID) | NASAL | 12 refills | Status: DC

## 2017-06-16 NOTE — Discharge Instructions (Signed)
Rapid strep negative. Symptoms are most likely due to viral illness. Cromolyn for nasal congestion/drainage. You can use over the counter nasal saline rinse such as neti pot for nasal congestion. Continue hot tea/honey/ginger for sore throat/cough. Keep hydrated, your urine should be clear to pale yellow in color. Monitor for any worsening of symptoms, swelling of the throat, trouble breathing, trouble swallowing, follow up for reevaluation.   Eye drainage most likely due to viral illness. Do lid scrubs and warm compress of the eye. You can get over the counter artificial gel such as systane, genteal at night to help with eye itching. Monitor for any worsening of symptoms, changes in vision, sensitivity to light, eye swelling, follow up with ophthalmology for further evaluation.   As discussed, your heart rate is elevated. As you are not having chest pain, shortness of breath, leg swelling, I would like you to monitor for now. Please go to the emergency department if you get chest pain, shortness of breath, one sided leg swelling/redness/ increased warmth, for further evaluation.

## 2017-06-16 NOTE — ED Provider Notes (Signed)
[facility]    CSN: [id number] Arrival date & time: 06/16/17  1159     History   Chief Complaint Chief Complaint  Patient presents with  . Cough  . Mucus coming out of eyes  . Nasal Congestion    HPI Susy FrizzleLaura N Hackbart is a 22 y.o. female.   22 year old female who is 7 months pregnant comes in for 1 day history of URI symptoms.  States woke up with  crusting around the eyes, which caused blurry vision.  Once crusting cleaned out, vision return to normal.  Denies photophobia, eye redness, pain.  States that she has some eye itchiness.  Is also had productive cough, nasal congestion, rhinorrhea, sore throat.  Denies fever, chills, night sweats.  Denies chest pain, palpitations, swelling of the legs.  States not necessarily states short of breath, states "feels that mucus is in the lungs".  OTC tea with ginger/lemon with some relief.  Has not needed to use her inhalers.  No sick contact. Former smoker.       Past Medical History:  Diagnosis Date  . Abnormal Pap smear   . Anxiety   . Asthma   . Chlamydia   . Gonorrhea   . Hypoglycemia   . Ovarian cyst     Patient Active Problem List   Diagnosis Date Noted  . GDM (gestational diabetes mellitus) 06/01/2017  . Supervision of other normal pregnancy, antepartum 02/07/2017    Past Surgical History:  Procedure Laterality Date  . APPENDECTOMY    . CYSTECTOMY    . WISDOM TOOTH EXTRACTION      OB History    Gravida Para Term Preterm AB Living   2 1 1     1    SAB TAB Ectopic Multiple Live Births         0 1       Home Medications    Prior to Admission medications   Medication Sig Start Date End Date Taking? Authorizing Provider  Prenat-FeAsp-Meth-FA-DHA w/o A (PRENATE PIXIE) 10-0.6-0.4-200 MG CAPS Take 1 tablet by mouth daily. 02/07/17  Yes Denney, Rachelle A, CNM  cromolyn (NASALCROM) 5.2 MG/ACT nasal spray Place 1 spray into both nostrils 4 (four) times daily. 06/16/17   Belinda FisherYu, Amy V, PA-C    Family  History Family History  Problem Relation Age of Onset  . Hypertension Other   . Diabetes Other   . Other Neg Hx     Social History Social History   Tobacco Use  . Smoking status: Former Smoker    Packs/day: 0.50    Types: Cigarettes    Last attempt to quit: 09/23/2014    Years since quitting: 2.7  . Smokeless tobacco: Never Used  Substance Use Topics  . Alcohol use: No    Alcohol/week: 0.0 oz  . Drug use: No     Allergies   Latex   Review of Systems Review of Systems  Reason unable to perform ROS: See HPI as above.     Physical Exam Triage Vital Signs ED Triage Vitals [06/16/17 1208]  Enc Vitals Group     BP 118/77     Pulse Rate (!) 115     Resp 16     Temp (!) 97.5 F (36.4 C)     Temp Source Oral     SpO2 97 %     Weight      Height      Head Circumference      Peak Flow  Pain Score      Pain Loc      Pain Edu?      Excl. in GC?    No data found.  Updated Vital Signs BP 118/77 (BP Location: Right Arm)   Pulse (!) 111   Temp (!) 97.5 F (36.4 C) (Oral)   Resp 16   LMP 11/18/2016   SpO2 97%   Visual Acuity Right Eye Distance: 20/40 Left Eye Distance: 20/40 Bilateral Distance: 20/25  Right Eye Near:   Left Eye Near:    Bilateral Near:     Physical Exam  Constitutional: She is oriented to person, place, and time. She appears well-developed and well-nourished. No distress.  HENT:  Head: Normocephalic and atraumatic.  Right Ear: Tympanic membrane, external ear and ear canal normal. Tympanic membrane is not erythematous and not bulging.  Left Ear: Tympanic membrane, external ear and ear canal normal. Tympanic membrane is not erythematous and not bulging.  Nose: Mucosal edema and rhinorrhea present. Right sinus exhibits maxillary sinus tenderness. Right sinus exhibits no frontal sinus tenderness. Left sinus exhibits maxillary sinus tenderness. Left sinus exhibits no frontal sinus tenderness.  Mouth/Throat: Uvula is midline and mucous  membranes are normal. Posterior oropharyngeal erythema present. No tonsillar exudate.  Eyes: Conjunctivae, EOM and lids are normal. Pupils are equal, round, and reactive to light.  Neck: Normal range of motion. Neck supple.  Cardiovascular: Regular rhythm and normal heart sounds. Tachycardia present. Exam reveals no gallop and no friction rub.  No murmur heard. Pulmonary/Chest: Effort normal and breath sounds normal. She has no decreased breath sounds. She has no wheezes. She has no rhonchi. She has no rales.  Musculoskeletal:  No swelling of the leg.  Lymphadenopathy:    She has no cervical adenopathy.  Neurological: She is alert and oriented to person, place, and time.  Skin: Skin is warm and dry.  Psychiatric: She has a normal mood and affect. Her behavior is normal. Judgment normal.   UC Treatments / Results  Labs (all labs ordered are listed, but only abnormal results are displayed) Labs Reviewed  CULTURE, GROUP A STREP [facility])  POCT RAPID STREP A    EKG  EKG Interpretation None       Radiology No results found.  Procedures Procedures (including critical care time)  Medications Ordered in UC Medications - No data to display   Initial Impression / Assessment and Plan / UC Course  I have reviewed the triage vital signs and the nursing notes.  Pertinent labs & imaging results that were available during my care of the patient were reviewed by me and considered in my medical decision making (see chart for details).    22 year old female who was 63-month pregnant comes in for 1 day history of URI symptoms.  Rapid strep negative.  She is afebrile, nontoxic in appearance.  She woke up with crusting of the eye, exam without conjunctival injection, EOM intact, visual acuity stable for both eyes, discussed possible viral  symptoms causing crusting.  Lungs are clear to auscultation bilaterally without adventitious lung sounds.  She is tachycardic on exam without chest pain,  shortness of breath, palpitation, leg swelling.  No recent long travels.  Will have patient monitor closely for now.  Strict return precautions given.  Patient expresses understanding and agrees to plan.  Final Clinical Impressions(s) / UC Diagnoses   Final diagnoses:  Acute nasopharyngitis    ED Discharge Orders        Ordered    cromolyn (  NASALCROM) 5.2 MG/ACT nasal spray  4 times daily,   Status:  Discontinued     06/16/17 1255    cromolyn (NASALCROM) 5.2 MG/ACT nasal spray  4 times daily     06/16/17 1300        Belinda Fisher, [place] 06/16/17 1326

## 2017-06-16 NOTE — ED Triage Notes (Signed)
Pt c/o mucus coming out of eyes, cough, nasal congestion

## 2017-06-17 ENCOUNTER — Other Ambulatory Visit: Payer: Self-pay | Admitting: Certified Nurse Midwife

## 2017-06-19 ENCOUNTER — Encounter: Payer: Medicaid Other | Attending: Certified Nurse Midwife | Admitting: Registered"

## 2017-06-19 DIAGNOSIS — Z713 Dietary counseling and surveillance: Secondary | ICD-10-CM | POA: Diagnosis present

## 2017-06-19 DIAGNOSIS — O24419 Gestational diabetes mellitus in pregnancy, unspecified control: Secondary | ICD-10-CM | POA: Insufficient documentation

## 2017-06-19 LAB — CULTURE, GROUP A STREP (THRC)

## 2017-06-20 ENCOUNTER — Encounter: Payer: Self-pay | Admitting: Registered"

## 2017-06-20 NOTE — Progress Notes (Signed)

## 2017-06-26 ENCOUNTER — Ambulatory Visit (INDEPENDENT_AMBULATORY_CARE_PROVIDER_SITE_OTHER): Payer: Medicaid Other | Admitting: Certified Nurse Midwife

## 2017-06-26 VITALS — BP 121/73 | HR 101 | Wt 172.3 lb

## 2017-06-26 DIAGNOSIS — O0990 Supervision of high risk pregnancy, unspecified, unspecified trimester: Secondary | ICD-10-CM

## 2017-06-26 DIAGNOSIS — O24410 Gestational diabetes mellitus in pregnancy, diet controlled: Secondary | ICD-10-CM

## 2017-06-26 NOTE — Patient Instructions (Addendum)
Gestational Diabetes Mellitus, Diagnosis Gestational diabetes (gestational diabetes mellitus) is a temporary form of diabetes that some women develop during pregnancy. It usually occurs around weeks 24-28 of pregnancy and goes away after delivery. Hormonal changes during pregnancy can interfere with insulin production and function, which may result in one or both of these problems:  The pancreas does not make enough of a hormone called insulin.  Cells in the body do not respond properly to insulin that the body makes (insulin resistance).  Normally, insulin allows sugars (glucose) to enter cells in the body. The cells use glucose for energy. Insulin resistance or lack of insulin causes excess glucose to build up in the blood instead of going into cells. As a result, high blood glucose (hyperglycemia) develops. What are the risks? If gestational diabetes is treated, it is unlikely to cause problems. If it is not controlled with treatment, it may cause problems during labor and delivery, and some of those problems can be harmful to the unborn baby (fetus) and the mother. Uncontrolled gestational diabetes may also cause the newborn baby to have breathing problems and low blood glucose. Women who get gestational diabetes are more likely to develop it if they get pregnant again, and they are more likely to develop type 2 diabetes in the future. What increases the risk? This condition may be more likely to develop in pregnant women who:  Are older than age 2 during pregnancy.  Have a family history of diabetes.  Are overweight.  Had gestational diabetes in the past.  Have polycystic ovarian syndrome (PCOS).  Are pregnant with twins or multiples.  Are of American-Indian, African-American, Hispanic/Latino, or Asian/Pacific Islander descent.  What are the signs or symptoms? Most women do not notice symptoms of gestational diabetes because the symptoms are similar to normal symptoms of pregnancy.  Symptoms of gestational diabetes may include:  Increased thirst (polydipsia).  Increased hunger(polyphagia).  Increased urination (polyuria).  How is this diagnosed?  This condition may be diagnosed based on your blood glucose level, which may be checked with one or more of the following blood tests:  A fasting blood glucose (FBG) test. You will not be allowed to eat (you will fast) for at least 8 hours before a blood sample is taken.  A random blood glucose test. This checks your blood glucose at any time of day regardless of when you ate.  An oral glucose tolerance test (OGTT). This is usually done during weeks 24-28 of pregnancy. ? For this test, you will have an FBG test done. Then, you will drink a beverage that contains glucose. Your blood glucose will be tested again 1 hour after drinking the glucose beverage (1-hour OGTT). ? If the 1-hour OGTT result is at or above 140 mg/dL (7.8 mmol/L), you will repeat the OGTT. This time, your blood glucose will be tested 3 hours after drinking the glucose beverage (3-hour OGTT).  If you have risk factors, you may be screened for undiagnosed type 2 diabetes at your first health care visit during your pregnancy (prenatal visit). How is this treated?  Your treatment may be managed by a specialist called an endocrinologist. This condition is treated by following instructions from your health care provider about:  Eating a healthier diet and getting more physical activity. These changes are the most important ways to manage gestational diabetes.  Checking your blood glucose. Do this as often as told.  Taking diabetes medicines or insulin every day. These will only be prescribed if they are  needed. ? If you use insulin, you may need to adjust your dosage based on how physically active you are and what foods you eat. Your health care provider will tell you how to do this.  Your health care provider will set treatment goals for you based on the  stage of your pregnancy and any other medical conditions you have. Generally, the goal of treatment is to maintain the following blood glucose levels during pregnancy:  Fasting: at or below 95 mg/dL (5.3 mmol/L).  After meals (postprandial): ? One hour after a meal: at or below 140 mg/dL (7.8 mmol/L). ? Two hours after a meal: at or below 120 mg/dL (6.7 mmol/L).  A1c (hemoglobin A1c) level: 6-6.5%.  Follow these instructions at home:  Take over-the-counter and prescription medicines only as told by your health care provider.  Manage your weight gain during pregnancy. The amount of weight that you are expected to gain depends on your pre-pregnancy BMI (body mass index).  Keep all follow-up visits as told by your health care provider. This is important. Consider asking your health care provider these questions:   Do I need to meet with a diabetes educator?  Where can I find a support group for people with diabetes?  What equipment will I need to manage my diabetes at home?  What diabetes medicines do I need, and when should I take them?  How often do I need to check my blood glucose?  What number can I call if I have questions?  When is my next appointment? Where to find more information:  For more information about diabetes, visit: ? American Diabetes Association (ADA): www.diabetes.org ? American Association of Diabetes Educators (AADE): www.diabeteseducator.org/patient-resources Contact a health care provider if:  Your blood glucose level is at or above 240 mg/dL (13.3 mmol/L).  Your blood glucose level is at or above 200 mg/dL (11.1 mmol/L) and you have ketones in your urine.  You have been sick or have had a fever for 2 days or more and you are not getting better.  You have any of the following problems for more than 6 hours: ? You cannot eat or drink. ? You have nausea and vomiting. ? You have diarrhea. Get help right away if:  Your blood glucose is below 54  mg/dL (3 mmol/L).  You become confused or you have trouble thinking clearly.  You have difficulty breathing.  You have moderate or large ketone levels in your urine.  Your baby is moving around less than usual.  You develop unusual discharge or bleeding from your vagina.  You start having contractions early (prematurely). Contractions may feel like a tightening in your lower abdomen. This information is not intended to replace advice given to you by your health care provider. Make sure you discuss any questions you have with your health care provider. Document Released: 07/09/2000 Document Revised: 09/08/2015 Document Reviewed: 05/06/2015 Elsevier Interactive Patient Education  2018 [employer].  Gestational Diabetes Mellitus, Self Care When you have gestational diabetes (gestational diabetes mellitus), you must keep your blood sugar (glucose) under control. You can do this with:  Nutrition.  Exercise.  Lifestyle changes.  Medicines or insulin, if needed.  Support from your doctors and others.  How do I manage my blood sugar?  Check your blood sugar every day during pregnancy. Check it as often as told.  Call your doctor if your blood sugar is above your goal numbers for 2 tests in a row. Your doctor will set treatment goals  for you. Try to have these blood sugars:  After not eating for a long time (after fasting): at or below 95 mg/dL (5.3 mmol/L).  After meals (postprandial): ? One hour after a meal: at or below 140 mg/dL (7.8 mmol/L). ? Two hours after a meal: at or below 120 mg/dL (6.7 mmol/L).  A1c (hemoglobin A1c) level: 6-6.5%.  What do I need to know about high blood sugar? High blood sugar is called hyperglycemia. Know the early signs of high blood sugar. Signs include:  Feeling: ? Thirsty. ? Hungry. ? Very tired.  Needing to pee (urinate) more than usual.  Blurry vision.  What do I need to know about low blood sugar? Low blood sugar is called  hypoglycemia. This is when blood sugar is at or below 70 mg/dL (3.9 mmol/L). Symptoms may include:  Feeling: ? Hungry. ? Worried or nervous (anxious). ? Sweaty or clammy. ? Confused. ? Dizzy. ? Sleepy. ? Sick to your stomach (nauseous).  Having: ? A fast heartbeat. ? A headache. ? A change in vision. ? Jerky movements that you cannot control (seizure). ? Nightmares. ? Tingling or no feeling (numbness) around the mouth, lips, or tongue.  Having trouble with: ? Talking. ? Paying attention (concentrating). ? Moving (coordination). ? Sleeping.  Shaking.  Passing out (fainting).  Getting upset easily (irritability).  Treating low blood sugar  To treat low blood sugar, eat or drink something sugary right away. If you can think clearly and swallow safely, follow the 15:15 rule:  Take 15 grams of a fast-acting carb (carbohydrate). Some fast-acting carbs are: ? 1 tube of glucose gel. ? 3 sugar tablets (glucose pills). ? 6-8 pieces of hard candy. ? 4 oz (120 mL) of fruit juice. ? 4 oz (120 mL) regular (not diet) soda.  Check your blood sugar 15 minutes after you take the carb.  If your blood sugar is still at or below 70 mg/dL (3.9 mmol/L), take 15 grams of a carb again.  If your blood sugar does not go above 70 mg/dL (3.9 mmol/L) after 3 tries, get help right away.  After your blood sugar goes back to normal, eat a meal or a snack within 1 hour.  Treating very low blood sugar If your blood sugar is at or below 54 mg/dL (3 mmol/L), you have very low blood sugar (severe hypoglycemia). This is an emergency. Do not wait to see if the symptoms will go away. Get medical help right away. Call your local emergency services (911 in the [place] Do not drive yourself to the hospital. If you have very low blood sugar and you cannot eat or drink, you may need a glucagon shot (injection). A family member or friend should learn:  How to check your blood sugar.  How to give you a  glucagon shot.  Ask your doctor if you need a glucagon shot kit at home. What else is important to manage my diabetes? Medicine  Take your insulin and diabetes medicines as told.  Do not run out of insulin or medicines.  Adjust your insulin and diabetes medicines as told. Food   Make healthy food choices. These include: ? Chicken, fish, egg whites, and beans. ? Oats, whole wheat, bulgur, brown rice, quinoa, and millet. ? Fresh fruits and vegetables. ? Low-fat dairy products. ? Nuts, avocado, olive oil, and canola oil.  Make an eating plan. A food specialist (dietitian) can help you.  Follow instructions from your doctor about what you cannot eat or drink.  Drink enough fluid to keep your pee (urine) clear or pale yellow.  Eat healthy snacks between healthy meals.  Keep track of carbs you eat. Read food labels. Learn about food serving sizes.  Follow your sick day plan when you cannot eat or drink normally. Make this plan with your doctor. Activity  Exercise 30 minutes or more a day or as much as told by your doctor.  Talk with your doctor if you start a new exercise. Your doctor may need to adjust your insulin, medicines, or food. Lifestyle  Do not drink alcohol.  Do not use any tobacco products. If you need help quitting, ask your doctor.  Learn how to deal with stress. If you need help with this, ask your doctor. Body care   Stay up to date with your shots (immunizations).  Brush your teeth and gums two times a day. Floss at least one time a day.  Go to the dentist least one time every 6 months.  Stay at a healthy weight while you are pregnant. General instructions  Take over-the-counter and prescription medicines only as told by your doctor.  Ask your doctor about risks of high blood pressure in pregnancy. These are called preeclampsia and eclampsia.  Share your diabetes care plan with: ? Your work or school. ? People you live with.  Check your pee  for ketones: ? When you are sick. ? As told by your doctor.  Ask your doctor: ? Do I need to meet with a diabetes educator? ? Where can I find a support group for people with diabetes?  Carry a card or wear jewelry that says that you have diabetes.  Keep all follow-up visits with your doctor. This is important. Care after giving birth   Have your blood sugar checked 4-12 weeks after you give birth.  Get checked for diabetes at least every 3 years. Where to find more information: To learn more about diabetes, visit:  American Diabetes Association: www.diabetes.org/diabetes-basics/gestational  Centers for Disease Control and Prevention (CDC): http://sanchez-watson.com/.pdf  This information is not intended to replace advice given to you by your health care provider. Make sure you discuss any questions you have with your health care provider. Document Released: 07/25/2015 Document Revised: 09/08/2015 Document Reviewed: 05/06/2015 Elsevier Interactive Patient Education  2018 [employer].  Type 1 or Type 2 Diabetes Mellitus During Pregnancy, Diagnosis Type 1 diabetes (type 1 diabetes mellitus) and type 2 diabetes (type 2 diabetes mellitus) are long-term (chronic) diseases. In type 1 diabetes, the pancreas does not make enough of a hormone called insulin. In Type 2 diabetes, one or both of these problems may be present:  The pancreas does not make enough insulin.  Cells in the body do not respond properly to insulin that the body makes (insulin resistance).  Normally, insulin allows sugars (glucose) to enter cells in the body. The cells use glucose for energy. Insulin resistance or lack of insulin causes excess glucose to build up in the blood instead of going into cells. As a result, high blood glucose (hyperglycemia) develops. What increases the risk? You may be more likely to develop diabetes if you have a family history of diabetes. Other factors may make  you more likely to develop type 1 diabetes, such as:  Having a gene for type 1 diabetes that is passed along from parent to child (inherited).  Living in an area that has cold weather conditions.  Exposure to certain viruses.  Certain conditions in which the body's disease-fighting system  attacks itself (autoimmune disorders).  Other factors may make you more likely to develop type 2 diabetes, such as:  Being overweight or obese.  An inactive (sedentary) lifestyle.  Having a history of: ? Prediabetes. ? Gestational diabetes. ? Polycystic ovarian syndrome (PCOS).  Being of American-Indian, African-American, Hispanic/Latino, or Asian/Pacific Islander descent.  What are the signs or symptoms? Some of the symptoms of diabetes may be similar to normal symptoms of pregnancy. Symptoms of type 1 or type 2 diabetes may include:  Increased thirst (polydipsia).  Increased urination (polyuria).  Increased hunger (polyphagia).  Increased urination during the night (nocturia).  Sudden or unexplained weight changes.  Frequent infections that keep coming back (recurring).  Fatigue.  Weakness.  Vision changes, such as blurry vision.  Fruity-smelling breath.  Cuts or bruises that are slow to heal.  Tingling or numbness in the hands or feet.  Dark patches on the skin (acanthosis nigricans).  How is this diagnosed? Diabetes is diagnosed based on your symptoms, your medical history, a physical exam, and blood glucose level. Your blood glucose level may be checked with one or more of the following blood tests:  A fasting blood glucose (FBG) test. You will not be allowed to eat (you will fast) for at least 8 hours before a blood sample is taken.  A random blood glucose test. This checks your blood glucose at any time of day regardless of when you ate.  An A1c (hemoglobin A1c) blood test. This provides information about blood glucose control over the previous 2-3 months.  You may  be diagnosed with diabetes if:  Your FBG level is 126 mg/dL (7.0 mmol/L) or higher.  Your random blood glucose level is 200 mg/dL (11.1 mmol/L) or higher.  Your A1c level is 6.5% or higher.  These blood tests may be repeated to confirm your diagnosis. If you have risk factors for diabetes, you may be screened for diabetes at your first health care visit during your pregnancy (prenatal visit). How is this treated? Your treatment may be managed by a specialist called an endocrinologist. Your health care provider may treat your diabetes by instructing you to:  Take insulin or other medicines daily. This helps to keep your blood glucose levels in the healthy range. ? If you use insulin, you may need to adjust your dosage based on how physically active you are and what foods you eat. Your health care provider will tell you how to do this.  Check your blood glucose. Do this as often as told.  Make diet and lifestyle changes. These may include: ? Following an individualized nutrition plan that is developed by a diet and nutrition specialist (registered dietitian). ? Exercising regularly. ? Finding ways to manage stress.  Take medicines to help prevent complications from diabetes, such as: ? Aspirin. ? Medicine to lower cholesterol. ? Medicine to control blood pressure.  Your health care provider will set individualized treatment goals for you. Generally, the goal of treatment is to maintain the following blood glucose levels during pregnancy:  Fasting: at or below 95 mg/dL (5.3 mmol/L).  After meals (postprandial): ? One hour after a meal: at or below 140 mg/dL (7.8 mmol/L). ? Two hours after a meal: at or below 120 mg/dL (6.7 mmol/L).  A1c level: 6-6.5%.  Follow these instructions at home: Questions to ask your health care provider  Consider asking the following questions:  Do I need to meet with a diabetes educator?  Where can I find a support group for people  with  diabetes?  What equipment will I need to manage my diabetes at home?  What diabetes medicines do I need, and when should I take them? How often do I need to check my blood glucose? Diabetes Mellitus and Nutrition When you have diabetes (diabetes mellitus), it is very important to have healthy eating habits because your blood sugar (glucose) levels are greatly affected by what you eat and drink. Eating healthy foods in the appropriate amounts, at about the same times every day, can help you: Control your blood glucose. Lower your risk of heart disease. Improve your blood pressure. Reach or maintain a healthy weight.  Every person with diabetes is different, and each person has different needs for a meal plan. Your health care provider may recommend that you work with a diet and nutrition specialist (dietitian) to make a meal plan that is best for you. Your meal plan may vary depending on factors such as: The calories you need. The medicines you take. Your weight. Your blood glucose, blood pressure, and cholesterol levels. Your activity level. Other health conditions you have, such as heart or kidney disease.  How do carbohydrates affect me? Carbohydrates affect your blood glucose level more than any other type of food. Eating carbohydrates naturally increases the amount of glucose in your blood. Carbohydrate counting is a method for keeping track of how many carbohydrates you eat. Counting carbohydrates is important to keep your blood glucose at a healthy level, especially if you use insulin or take certain oral diabetes medicines. It is important to know how many carbohydrates you can safely have in each meal. This is different for every person. Your dietitian can help you calculate how many carbohydrates you should have at each meal and for snack. Foods that contain carbohydrates include: Bread, cereal, rice, pasta, and crackers. Potatoes and corn. Peas, beans, and lentils. Milk and  yogurt. Fruit and juice. Desserts, such as cakes, cookies, ice cream, and candy.  How does alcohol affect me? Alcohol can cause a sudden decrease in blood glucose (hypoglycemia), especially if you use insulin or take certain oral diabetes medicines. Hypoglycemia can be a life-threatening condition. Symptoms of hypoglycemia (sleepiness, dizziness, and confusion) are similar to symptoms of having too much alcohol. If your health care provider says that alcohol is safe for you, follow these guidelines: Limit alcohol intake to no more than 1 drink per day for nonpregnant women and 2 drinks per day for men. One drink equals 12 oz of beer, 5 oz of wine, or 1 oz of hard liquor. Do not drink on an empty stomach. Keep yourself hydrated with water, diet soda, or unsweetened iced tea. Keep in mind that regular soda, juice, and other mixers may contain a lot of sugar and must be counted as carbohydrates.  What are tips for following this plan? Reading food labels Start by checking the serving size on the label. The amount of calories, carbohydrates, fats, and other nutrients listed on the label are based on one serving of the food. Many foods contain more than one serving per package. Check the total grams (g) of carbohydrates in one serving. You can calculate the number of servings of carbohydrates in one serving by dividing the total carbohydrates by 15. For example, if a food has 30 g of total carbohydrates, it would be equal to 2 servings of carbohydrates. Check the number of grams (g) of saturated and trans fats in one serving. Choose foods that have low or no amount of these  fats. Check the number of milligrams (mg) of sodium in one serving. Most people should limit total sodium intake to less than 2,300 mg per day. Always check the nutrition information of foods labeled as "low-fat" or "nonfat". These foods may be higher in added sugar or refined carbohydrates and should be avoided. Talk to your  dietitian to identify your daily goals for nutrients listed on the label. Shopping Avoid buying canned, premade, or processed foods. These foods tend to be high in fat, sodium, and added sugar. Shop around the outside edge of the grocery store. This includes fresh fruits and vegetables, bulk grains, fresh meats, and fresh dairy. Cooking Use low-heat cooking methods, such as baking, instead of high-heat cooking methods like deep frying. Cook using healthy oils, such as olive, canola, or sunflower oil. Avoid cooking with butter, cream, or high-fat meats. Meal planning Eat meals and snacks regularly, preferably at the same times every day. Avoid going long periods of time without eating. Eat foods high in fiber, such as fresh fruits, vegetables, beans, and whole grains. Talk to your dietitian about how many servings of carbohydrates you can eat at each meal. Eat 4-6 ounces of lean protein each day, such as lean meat, chicken, fish, eggs, or tofu. 1 ounce is equal to 1 ounce of meat, chicken, or fish, 1 egg, or 1/4 cup of tofu. Eat some foods each day that contain healthy fats, such as avocado, nuts, seeds, and fish. Lifestyle  Check your blood glucose regularly. Exercise at least 30 minutes 5 or more days each week, or as told by your health care provider. Take medicines as told by your health care provider. Do not use any products that contain nicotine or tobacco, such as cigarettes and e-cigarettes. If you need help quitting, ask your health care provider. Work with a Social worker or diabetes educator to identify strategies to manage stress and any emotional and social challenges. What are some questions to ask my health care provider? Do I need to meet with a diabetes educator? Do I need to meet with a dietitian? What number can I call if I have questions? When are the best times to check my blood glucose? Where to find more information: American Diabetes Association:  diabetes.org/food-and-fitness/food Academy of Nutrition and Dietetics: PokerClues.dk [employer] of Diabetes and Digestive and Kidney Diseases (NIH): ContactWire.be Summary A healthy meal plan will help you control your blood glucose and maintain a healthy lifestyle. Working with a diet and nutrition specialist (dietitian) can help you make a meal plan that is best for you. Keep in mind that carbohydrates and alcohol have immediate effects on your blood glucose levels. It is important to count carbohydrates and to use alcohol carefully. This information is not intended to replace advice given to you by your health care provider. Make sure you discuss any questions you have with your health care provider. Document Released: 12/28/2004 Document Revised: 05/07/2016 Document Reviewed: 05/07/2016 Elsevier Interactive Patient Education  2018 [employer].  Diabetes Mellitus and Nutrition When you have diabetes (diabetes mellitus), it is very important to have healthy eating habits because your blood sugar (glucose) levels are greatly affected by what you eat and drink. Eating healthy foods in the appropriate amounts, at about the same times every day, can help you: Control your blood glucose. Lower your risk of heart disease. Improve your blood pressure. Reach or maintain a healthy weight.  Every person with diabetes is different, and each person has different needs for a meal plan.  Your health care provider may recommend that you work with a diet and nutrition specialist (dietitian) to make a meal plan that is best for you. Your meal plan may vary depending on factors such as: The calories you need. The medicines you take. Your weight. Your blood glucose, blood pressure, and cholesterol levels. Your activity level. Other health conditions you have, such as heart or  kidney disease.  How do carbohydrates affect me? Carbohydrates affect your blood glucose level more than any other type of food. Eating carbohydrates naturally increases the amount of glucose in your blood. Carbohydrate counting is a method for keeping track of how many carbohydrates you eat. Counting carbohydrates is important to keep your blood glucose at a healthy level, especially if you use insulin or take certain oral diabetes medicines. It is important to know how many carbohydrates you can safely have in each meal. This is different for every person. Your dietitian can help you calculate how many carbohydrates you should have at each meal and for snack. Foods that contain carbohydrates include: Bread, cereal, rice, pasta, and crackers. Potatoes and corn. Peas, beans, and lentils. Milk and yogurt. Fruit and juice. Desserts, such as cakes, cookies, ice cream, and candy.  How does alcohol affect me? Alcohol can cause a sudden decrease in blood glucose (hypoglycemia), especially if you use insulin or take certain oral diabetes medicines. Hypoglycemia can be a life-threatening condition. Symptoms of hypoglycemia (sleepiness, dizziness, and confusion) are similar to symptoms of having too much alcohol. If your health care provider says that alcohol is safe for you, follow these guidelines: Limit alcohol intake to no more than 1 drink per day for nonpregnant women and 2 drinks per day for men. One drink equals 12 oz of beer, 5 oz of wine, or 1 oz of hard liquor. Do not drink on an empty stomach. Keep yourself hydrated with water, diet soda, or unsweetened iced tea. Keep in mind that regular soda, juice, and other mixers may contain a lot of sugar and must be counted as carbohydrates.  What are tips for following this plan? Reading food labels Start by checking the serving size on the label. The amount of calories, carbohydrates, fats, and other nutrients listed on the label are based on one  serving of the food. Many foods contain more than one serving per package. Check the total grams (g) of carbohydrates in one serving. You can calculate the number of servings of carbohydrates in one serving by dividing the total carbohydrates by 15. For example, if a food has 30 g of total carbohydrates, it would be equal to 2 servings of carbohydrates. Check the number of grams (g) of saturated and trans fats in one serving. Choose foods that have low or no amount of these fats. Check the number of milligrams (mg) of sodium in one serving. Most people should limit total sodium intake to less than 2,300 mg per day. Always check the nutrition information of foods labeled as "low-fat" or "nonfat". These foods may be higher in added sugar or refined carbohydrates and should be avoided. Talk to your dietitian to identify your daily goals for nutrients listed on the label. Shopping Avoid buying canned, premade, or processed foods. These foods tend to be high in fat, sodium, and added sugar. Shop around the outside edge of the grocery store. This includes fresh fruits and vegetables, bulk grains, fresh meats, and fresh dairy. Cooking Use low-heat cooking methods, such as baking, instead of high-heat cooking methods like deep  frying. Cook using healthy oils, such as olive, canola, or sunflower oil. Avoid cooking with butter, cream, or high-fat meats. Meal planning Eat meals and snacks regularly, preferably at the same times every day. Avoid going long periods of time without eating. Eat foods high in fiber, such as fresh fruits, vegetables, beans, and whole grains. Talk to your dietitian about how many servings of carbohydrates you can eat at each meal. Eat 4-6 ounces of lean protein each day, such as lean meat, chicken, fish, eggs, or tofu. 1 ounce is equal to 1 ounce of meat, chicken, or fish, 1 egg, or 1/4 cup of tofu. Eat some foods each day that contain healthy fats, such as avocado, nuts, seeds, and  fish. Lifestyle  Check your blood glucose regularly. Exercise at least 30 minutes 5 or more days each week, or as told by your health care provider. Take medicines as told by your health care provider. Do not use any products that contain nicotine or tobacco, such as cigarettes and e-cigarettes. If you need help quitting, ask your health care provider. Work with a Social worker or diabetes educator to identify strategies to manage stress and any emotional and social challenges. What are some questions to ask my health care provider? Do I need to meet with a diabetes educator? Do I need to meet with a dietitian? What number can I call if I have questions? When are the best times to check my blood glucose? Where to find more information: American Diabetes Association: diabetes.org/food-and-fitness/food Academy of Nutrition and Dietetics: PokerClues.dk [employer] of Diabetes and Digestive and Kidney Diseases (NIH): ContactWire.be Summary A healthy meal plan will help you control your blood glucose and maintain a healthy lifestyle. Working with a diet and nutrition specialist (dietitian) can help you make a meal plan that is best for you. Keep in mind that carbohydrates and alcohol have immediate effects on your blood glucose levels. It is important to count carbohydrates and to use alcohol carefully. This information is not intended to replace advice given to you by your health care provider. Make sure you discuss any questions you have with your health care provider. Document Released: 12/28/2004 Document Revised: 05/07/2016 Document Reviewed: 05/07/2016 Elsevier Interactive Patient Education  [employer].

## 2017-06-26 NOTE — Progress Notes (Signed)
   PRENATAL VISIT NOTE  Subjective:  Brittany Williams is a 22 y.o. G2P1001 at 6571w2d being seen today for ongoing prenatal care.  She is currently monitored for the following issues for this high-risk pregnancy and has Supervision of high risk pregnancy, antepartum and Gestational diabetes mellitus (GDM), antepartum on their problem list.  Patient reports no complaints.  Contractions: Not present. Vag. Bleeding: None.  Movement: Present. Denies leaking of fluid.   The following portions of the patient's history were reviewed and updated as appropriate: allergies, current medications, past family history, past medical history, past social history, past surgical history and problem list. Problem list updated.  Objective:   Vitals:   06/26/17 0817  BP: 121/73  Pulse: (!) 101  Weight: 172 lb 4.8 oz (78.2 kg)    Fetal Status: Fetal Heart Rate (bpm): 156; doppler Fundal Height: 30 cm Movement: Present     General:  Alert, oriented and cooperative. Patient is in no acute distress.  Skin: Skin is warm and dry. No rash noted.   Cardiovascular: Normal heart rate noted  Respiratory: Normal respiratory effort, no problems with respiration noted  Abdomen: Soft, gravid, appropriate for gestational age.  Pain/Pressure: Absent     Pelvic: Cervical exam deferred        Extremities: Normal range of motion.  Edema: None  Mental Status:  Normal mood and affect. Normal behavior. Normal judgment and thought content.   Assessment and Plan:  Pregnancy: G2P1001 at 6471w2d  1. Supervision of high risk pregnancy, antepartum     Doing well.  2. Diet controlled gestational diabetes mellitus (GDM), antepartum     Attended nutrition/DM class.  Discussed at length GDM in pregnancy.  Has meter, logs, test strips and lancets.  Did not bring in blood sugar logs.  Reports Fastings between: 83-95 with fasting this am 91.  Reports 2 hour PP between: 83-125.          Preterm labor symptoms and general obstetric  precautions including but not limited to vaginal bleeding, contractions, leaking of fluid and fetal movement were reviewed in detail with the patient. Please refer to After Visit Summary for other counseling recommendations.  [facility] (around [facility]2017[facility] Needs to see FP MD here.   Roe Coombsachelle A Omara Alcon, CNM

## 2017-07-04 ENCOUNTER — Other Ambulatory Visit: Payer: Self-pay | Admitting: Certified Nurse Midwife

## 2017-07-05 ENCOUNTER — Other Ambulatory Visit: Payer: Self-pay

## 2017-07-05 DIAGNOSIS — O24419 Gestational diabetes mellitus in pregnancy, unspecified control: Secondary | ICD-10-CM

## 2017-07-05 MED ORDER — GLUCOSE BLOOD VI STRP
ORAL_STRIP | 1 refills | Status: DC
Start: 2017-07-05 — End: 2017-08-21

## 2017-07-05 NOTE — Progress Notes (Signed)
TC from pt stating she is out of strips Pharm told her to contact us for refills  Refills sent.  Pt made aware.

## 2017-07-10 ENCOUNTER — Encounter: Payer: Self-pay | Admitting: Obstetrics and Gynecology

## 2017-07-10 ENCOUNTER — Ambulatory Visit (INDEPENDENT_AMBULATORY_CARE_PROVIDER_SITE_OTHER): Payer: Medicaid Other | Admitting: Obstetrics and Gynecology

## 2017-07-10 VITALS — BP 110/71 | HR 82 | Wt 174.0 lb

## 2017-07-10 DIAGNOSIS — O24410 Gestational diabetes mellitus in pregnancy, diet controlled: Secondary | ICD-10-CM

## 2017-07-10 DIAGNOSIS — O0990 Supervision of high risk pregnancy, unspecified, unspecified trimester: Secondary | ICD-10-CM

## 2017-07-10 NOTE — Patient Instructions (Signed)

## 2017-07-10 NOTE — Progress Notes (Signed)
Subjective:  Brittany Williams is a 22 y.o. G2P1001 at 2416w2d being seen today for ongoing prenatal care.  She is currently monitored for the following issues for this high-risk pregnancy and has Supervision of high risk pregnancy, antepartum and Gestational diabetes mellitus (GDM), antepartum on their problem list.  Patient reports no complaints.  Contractions: Not present. Vag. Bleeding: None.  Movement: Present. Denies leaking of fluid.   The following portions of the patient's history were reviewed and updated as appropriate: allergies, current medications, past family history, past medical history, past social history, past surgical history and problem list. Problem list updated.  Objective:   Vitals:   07/10/17 0910  BP: 110/71  Pulse: 82  Weight: 78.9 kg (174 lb)    Fetal Status: Fetal Heart Rate (bpm): 158;doppler   Movement: Present     General:  Alert, oriented and cooperative. Patient is in no acute distress.  Skin: Skin is warm and dry. No rash noted.   Cardiovascular: Normal heart rate noted  Respiratory: Normal respiratory effort, no problems with respiration noted  Abdomen: Soft, gravid, appropriate for gestational age. Pain/Pressure: Absent     Pelvic:  Cervical exam deferred        Extremities: Normal range of motion.     Mental Status: Normal mood and affect. Normal behavior. Normal judgment and thought content.   Urinalysis:      Assessment and Plan:  Pregnancy: G2P1001 at 1316w2d Stable GDM BS in goal range. Continue with diet U/S 61 % last scan  There are no diagnoses linked to this encounter. Preterm labor symptoms and general obstetric precautions including but not limited to vaginal bleeding, contractions, leaking of fluid and fetal movement were reviewed in detail with the patient. Please refer to After Visit Summary for other counseling recommendations.  Return in about 1 week (around 07/17/2017) for OB visit.   Hermina StaggersErvin, Michael L, MD

## 2017-07-17 ENCOUNTER — Encounter: Payer: Medicaid Other | Admitting: Obstetrics

## 2017-07-25 ENCOUNTER — Ambulatory Visit (INDEPENDENT_AMBULATORY_CARE_PROVIDER_SITE_OTHER): Payer: Medicaid Other | Admitting: Obstetrics

## 2017-07-25 ENCOUNTER — Encounter: Payer: Self-pay | Admitting: Obstetrics

## 2017-07-25 VITALS — BP 113/72 | HR 88 | Wt 172.2 lb

## 2017-07-25 DIAGNOSIS — O26893 Other specified pregnancy related conditions, third trimester: Secondary | ICD-10-CM

## 2017-07-25 DIAGNOSIS — O0993 Supervision of high risk pregnancy, unspecified, third trimester: Secondary | ICD-10-CM

## 2017-07-25 DIAGNOSIS — J301 Allergic rhinitis due to pollen: Secondary | ICD-10-CM

## 2017-07-25 DIAGNOSIS — O26899 Other specified pregnancy related conditions, unspecified trimester: Secondary | ICD-10-CM

## 2017-07-25 DIAGNOSIS — O24410 Gestational diabetes mellitus in pregnancy, diet controlled: Secondary | ICD-10-CM

## 2017-07-25 DIAGNOSIS — R12 Heartburn: Secondary | ICD-10-CM

## 2017-07-25 DIAGNOSIS — O0990 Supervision of high risk pregnancy, unspecified, unspecified trimester: Secondary | ICD-10-CM

## 2017-07-25 MED ORDER — LORATADINE 10 MG PO TABS: 10.0000 mg | ORAL_TABLET | Freq: Every day | ORAL | 11 refills | Status: DC

## 2017-07-25 MED ORDER — OMEPRAZOLE 20 MG PO CPDR: 20.0000 mg | DELAYED_RELEASE_CAPSULE | Freq: Two times a day (BID) | ORAL | 5 refills | Status: DC

## 2017-07-25 NOTE — Progress Notes (Signed)
Subjective:  Brittany Williams is a 22 y.o. G2P1001 at 3828w3d being seen today for ongoing prenatal care.  She is currently monitored for the following issues for this high-risk pregnancy and has Supervision of high risk pregnancy, antepartum and Gestational diabetes mellitus (GDM), antepartum on their problem list.  Patient reports heartburn.  Contractions: Irregular. Vag. Bleeding: None.  Movement: Present. Denies leaking of fluid.   The following portions of the patient's history were reviewed and updated as appropriate: allergies, current medications, past family history, past medical history, past social history, past surgical history and problem list. Problem list updated.  Objective:   Vitals:   07/25/17 0931  BP: 113/72  Pulse: 88  Weight: 172 lb 3.2 oz (78.1 kg)    Fetal Status: Fetal Heart Rate (bpm): 150   Movement: Present     General:  Alert, oriented and cooperative. Patient is in no acute distress.  Skin: Skin is warm and dry. No rash noted.   Cardiovascular: Normal heart rate noted  Respiratory: Normal respiratory effort, no problems with respiration noted  Abdomen: Soft, gravid, appropriate for gestational age. Pain/Pressure: Present     Pelvic:  Cervical exam deferred        Extremities: Normal range of motion.  Edema: Trace  Mental Status: Normal mood and affect. Normal behavior. Normal judgment and thought content.   Urinalysis:      Assessment and Plan:  Pregnancy: G2P1001 at 6028w3d  1. Supervision of high risk pregnancy, antepartum   2. Heartburn during pregnancy, antepartum Rx: - omeprazole (PRILOSEC) 20 MG capsule; Take 1 capsule (20 mg total) by mouth 2 (two) times daily before a meal.  Dispense: 60 capsule; Refill: 5  3. Seasonal allergic rhinitis due to pollen Rx: - loratadine (CLARITIN) 10 MG tablet; Take 1 tablet (10 mg total) by mouth daily.  Dispense: 30 tablet; Refill: 11  4. GDM (gestational diabetes mellitus), class A1 Rx; - US MFM OB FOLLOW  UP; Future  Preterm labor symptoms and general obstetric precautions including but not limited to vaginal bleeding, contractions, leaking of fluid and fetal movement were reviewed in detail with the patient. Please refer to After Visit Summary for other counseling recommendations.  Return in about 1 week (around 08/01/2017) for [facility].   Brock BadHarper, Charles A, MD

## 2017-07-31 ENCOUNTER — Ambulatory Visit (INDEPENDENT_AMBULATORY_CARE_PROVIDER_SITE_OTHER): Payer: Medicaid Other | Admitting: Certified Nurse Midwife

## 2017-07-31 ENCOUNTER — Other Ambulatory Visit (HOSPITAL_COMMUNITY)
Admission: RE | Admit: 2017-07-31 | Discharge: 2017-07-31 | Disposition: A | Discharge status: Home / Self Care | Payer: Medicaid Other | Source: Ambulatory Visit | Attending: Certified Nurse Midwife | Admitting: Certified Nurse Midwife

## 2017-07-31 ENCOUNTER — Encounter: Payer: Self-pay | Admitting: Certified Nurse Midwife

## 2017-07-31 VITALS — BP 115/71 | HR 96 | Wt 173.3 lb

## 2017-07-31 DIAGNOSIS — O0993 Supervision of high risk pregnancy, unspecified, third trimester: Secondary | ICD-10-CM

## 2017-07-31 DIAGNOSIS — O24410 Gestational diabetes mellitus in pregnancy, diet controlled: Secondary | ICD-10-CM

## 2017-07-31 DIAGNOSIS — O0990 Supervision of high risk pregnancy, unspecified, unspecified trimester: Secondary | ICD-10-CM

## 2017-07-31 LAB — OB RESULTS CONSOLE GC/CHLAMYDIA: GC PROBE AMP, GENITAL: NEGATIVE

## 2017-07-31 NOTE — Progress Notes (Signed)
ROB GBS 

## 2017-07-31 NOTE — Progress Notes (Signed)
   PRENATAL VISIT NOTE  Subjective:  Brittany Williams is a 22 y.o. G2P1001 at 7567w2d being seen today for ongoing prenatal care.  She is currently monitored for the following issues for this high-risk pregnancy and has Supervision of high risk pregnancy, antepartum and Gestational diabetes mellitus (GDM), antepartum on their problem list.  Patient reports no complaints.  Contractions: Irritability. Vag. Bleeding: None.  Movement: Present. Denies leaking of fluid.   The following portions of the patient's history were reviewed and updated as appropriate: allergies, current medications, past family history, past medical history, past social history, past surgical history and problem list. Problem list updated.  Objective:   Vitals:   07/31/17 0856  BP: 115/71  Pulse: 96  Weight: 173 lb 4.8 oz (78.6 kg)    Fetal Status: Fetal Heart Rate (bpm): 146; doppler Fundal Height: 35 cm Movement: Present  Presentation: Vertex  General:  Alert, oriented and cooperative. Patient is in no acute distress.  Skin: Skin is warm and dry. No rash noted.   Cardiovascular: Normal heart rate noted  Respiratory: Normal respiratory effort, no problems with respiration noted  Abdomen: Soft, gravid, appropriate for gestational age.  Pain/Pressure: Absent     Pelvic: Cervical exam performed Dilation: 1 Effacement (%): Thick Station: Ballotable, midline  Extremities: Normal range of motion.  Edema: Trace  Mental Status: Normal mood and affect. Normal behavior. Normal judgment and thought content.   Assessment and Plan:  Pregnancy: G2P1001 at 9867w2d  1. Supervision of high risk pregnancy, antepartum     Doing well.  Has f/u growth US scheduled.  - Cervicovaginal ancillary only - Strep Gp B NAA  2. Diet controlled gestational diabetes mellitus (GDM), antepartum     CBGs stable.  Reports fastings <90, and 2 hour PP <210.   Preterm labor symptoms and general obstetric precautions including but not limited to  vaginal bleeding, contractions, leaking of fluid and fetal movement were reviewed in detail with the patient. Please refer to After Visit Summary for other counseling recommendations.  [facility]around [facility]07[facility]2017[facility]  Future Appointments  Date Time Provider Department Center  08/05/2017  9:45 AM [facility] US 2 [facility]    Roe Coombsachelle A Denney, CNM

## 2017-08-01 LAB — CERVICOVAGINAL ANCILLARY ONLY
Bacterial vaginitis: NEGATIVE
CHLAMYDIA, DNA PROBE: NEGATIVE
Candida vaginitis: NEGATIVE
Neisseria Gonorrhea: NEGATIVE
Trichomonas: NEGATIVE

## 2017-08-02 LAB — STREP GP B NAA: Strep Gp B NAA: NEGATIVE

## 2017-08-05 ENCOUNTER — Other Ambulatory Visit: Payer: Self-pay | Admitting: Certified Nurse Midwife

## 2017-08-05 ENCOUNTER — Other Ambulatory Visit: Payer: Self-pay | Admitting: Obstetrics

## 2017-08-05 ENCOUNTER — Ambulatory Visit (HOSPITAL_COMMUNITY)
Admission: RE | Admit: 2017-08-05 | Discharge: 2017-08-05 | Disposition: A | Discharge status: Home / Self Care | Payer: Medicaid Other | Source: Ambulatory Visit | Attending: Obstetrics | Admitting: Obstetrics

## 2017-08-05 DIAGNOSIS — Z362 Encounter for other antenatal screening follow-up: Secondary | ICD-10-CM

## 2017-08-05 DIAGNOSIS — O24410 Gestational diabetes mellitus in pregnancy, diet controlled: Secondary | ICD-10-CM

## 2017-08-05 DIAGNOSIS — O09893 Supervision of other high risk pregnancies, third trimester: Secondary | ICD-10-CM | POA: Diagnosis not present

## 2017-08-05 DIAGNOSIS — Z3A37 37 weeks gestation of pregnancy: Secondary | ICD-10-CM

## 2017-08-05 DIAGNOSIS — O0990 Supervision of high risk pregnancy, unspecified, unspecified trimester: Secondary | ICD-10-CM

## 2017-08-07 ENCOUNTER — Encounter: Payer: Self-pay | Admitting: Certified Nurse Midwife

## 2017-08-07 ENCOUNTER — Ambulatory Visit (INDEPENDENT_AMBULATORY_CARE_PROVIDER_SITE_OTHER): Payer: Medicaid Other | Admitting: Certified Nurse Midwife

## 2017-08-07 VITALS — BP 105/68 | HR 96 | Wt 172.4 lb

## 2017-08-07 DIAGNOSIS — O0990 Supervision of high risk pregnancy, unspecified, unspecified trimester: Secondary | ICD-10-CM

## 2017-08-07 DIAGNOSIS — O24410 Gestational diabetes mellitus in pregnancy, diet controlled: Secondary | ICD-10-CM

## 2017-08-07 NOTE — Progress Notes (Signed)
   PRENATAL VISIT NOTE  Subjective:  Brittany Williams is a 22 y.o. G2P1001 at 6152w2d being seen today for ongoing prenatal care.  She is currently monitored for the following issues for this high-risk pregnancy and has Supervision of high risk pregnancy, antepartum and Gestational diabetes mellitus (GDM), antepartum on their problem list.  Patient reports no complaints.  Contractions: Not present. Vag. Bleeding: None.  Movement: Present. Denies leaking of fluid.   The following portions of the patient's history were reviewed and updated as appropriate: allergies, current medications, past family history, past medical history, past social history, past surgical history and problem list. Problem list updated.  Objective:   Vitals:   08/07/17 1516  BP: 105/68  Pulse: 96  Weight: 172 lb 6.4 oz (78.2 kg)    Fetal Status: Fetal Heart Rate (bpm): 158; doppler Fundal Height: 36 cm Movement: Present     General:  Alert, oriented and cooperative. Patient is in no acute distress.  Skin: Skin is warm and dry. No rash noted.   Cardiovascular: Normal heart rate noted  Respiratory: Normal respiratory effort, no problems with respiration noted  Abdomen: Soft, gravid, appropriate for gestational age.  Pain/Pressure: Absent     Pelvic: Cervical exam deferred        Extremities: Normal range of motion.  Edema: Trace  Mental Status: Normal mood and affect. Normal behavior. Normal judgment and thought content.   Assessment and Plan:  Pregnancy: G2P1001 at 7552w2d  1. Supervision of high risk pregnancy, antepartum      Patient reported CBGs normal range.  EFW: 83% at 37 wks.   2. Diet controlled gestational diabetes mellitus (GDM), antepartum     Stable on diet control.   Term labor symptoms and general obstetric precautions including but not limited to vaginal bleeding, contractions, leaking of fluid and fetal movement were reviewed in detail with the patient. Please refer to After Visit Summary for  other counseling recommendations.  [facility]around [facility]2017[facility]  No future appointments.  Roe Coombsachelle A Giani Betzold, CNM

## 2017-08-07 NOTE — Progress Notes (Signed)
Patient reports fetal movement, denies pain. Pt forgot to bring BG readings today, reports they were 84 and 87 today.

## 2017-08-14 ENCOUNTER — Encounter: Payer: Self-pay | Admitting: Certified Nurse Midwife

## 2017-08-14 ENCOUNTER — Ambulatory Visit (INDEPENDENT_AMBULATORY_CARE_PROVIDER_SITE_OTHER): Payer: Medicaid Other | Admitting: Certified Nurse Midwife

## 2017-08-14 ENCOUNTER — Telehealth (HOSPITAL_COMMUNITY): Payer: Self-pay | Admitting: *Deleted

## 2017-08-14 VITALS — BP 126/80 | HR 84 | Wt 168.0 lb

## 2017-08-14 DIAGNOSIS — O24410 Gestational diabetes mellitus in pregnancy, diet controlled: Secondary | ICD-10-CM

## 2017-08-14 DIAGNOSIS — O0990 Supervision of high risk pregnancy, unspecified, unspecified trimester: Secondary | ICD-10-CM

## 2017-08-14 NOTE — Progress Notes (Signed)
   PRENATAL VISIT NOTE  Subjective:  Brittany Williams is a 22 y.o. G2P1001 at [redacted]w[redacted]d being seen today for ongoing prenatal care.  She is currently monitored for the following issues for this high-risk pregnancy and has Supervision of high risk pregnancy, antepartum and Gestational diabetes mellitus (GDM), antepartum on their problem list.  Patient reports no complaints.  Contractions: Irregular. Vag. Bleeding: None.  Movement: Present. Denies leaking of fluid.   The following portions of the patient's history were reviewed and updated as appropriate: allergies, current medications, past family history, past medical history, past social history, past surgical history and problem list. Problem list updated.  Objective:   Vitals:   08/14/17 0823  BP: 126/80  Pulse: 84  Weight: 168 lb (76.2 kg)    Fetal Status: Fetal Heart Rate (bpm): 142; doppler Fundal Height: 35 cm Movement: Present     General:  Alert, oriented and cooperative. Patient is in no acute distress.  Skin: Skin is warm and dry. No rash noted.   Cardiovascular: Normal heart rate noted  Respiratory: Normal respiratory effort, no problems with respiration noted  Abdomen: Soft, gravid, appropriate for gestational age.  Pain/Pressure: Present     Pelvic: Cervical exam deferred        Extremities: Normal range of motion.     Mental Status: Normal mood and affect. Normal behavior. Normal judgment and thought content.   Assessment and Plan:  Pregnancy: G2P1001 at [redacted]w[redacted]d  1. Supervision of high risk pregnancy, antepartum     Doing well.    2. Diet controlled gestational diabetes mellitus (GDM), antepartum     Reported CBGs in normal range  Preterm labor symptoms and general obstetric precautions including but not limited to vaginal bleeding, contractions, leaking of fluid and fetal movement were reviewed in detail with the patient. Please refer to After Visit Summary for other counseling recommendations.  Return in about 1 week  (around 08/21/2017) for [facility].  Future Appointments  Date Time Provider Department Center  08/21/2017  8:45 AM Roe Coombs, CNM [facility] None    Roe Coombs, CNM

## 2017-08-14 NOTE — Telephone Encounter (Signed)
Preadmission screen  

## 2017-08-20 ENCOUNTER — Encounter (HOSPITAL_COMMUNITY)
Admission: AD | Disposition: A | Discharge status: Home / Self Care | Payer: Self-pay | Source: Ambulatory Visit | Attending: Obstetrics & Gynecology

## 2017-08-20 ENCOUNTER — Encounter (HOSPITAL_COMMUNITY): Payer: Self-pay | Admitting: *Deleted

## 2017-08-20 ENCOUNTER — Inpatient Hospital Stay (HOSPITAL_COMMUNITY): Payer: Medicaid Other | Admitting: Anesthesiology

## 2017-08-20 ENCOUNTER — Inpatient Hospital Stay (HOSPITAL_COMMUNITY)
Admission: AD | Admit: 2017-08-20 | Discharge: 2017-08-23 | DRG: 786 | Disposition: A | Discharge status: Home / Self Care | Payer: Medicaid Other | Source: Ambulatory Visit | Attending: Obstetrics & Gynecology | Admitting: Obstetrics & Gynecology

## 2017-08-20 DIAGNOSIS — O4593 Premature separation of placenta, unspecified, third trimester: Secondary | ICD-10-CM | POA: Diagnosis not present

## 2017-08-20 DIAGNOSIS — Z91040 Latex allergy status: Secondary | ICD-10-CM

## 2017-08-20 DIAGNOSIS — Z87891 Personal history of nicotine dependence: Secondary | ICD-10-CM

## 2017-08-20 DIAGNOSIS — O34219 Maternal care for unspecified type scar from previous cesarean delivery: Secondary | ICD-10-CM

## 2017-08-20 DIAGNOSIS — Z3A39 39 weeks gestation of pregnancy: Secondary | ICD-10-CM | POA: Diagnosis not present

## 2017-08-20 DIAGNOSIS — O24420 Gestational diabetes mellitus in childbirth, diet controlled: Secondary | ICD-10-CM | POA: Diagnosis not present

## 2017-08-20 DIAGNOSIS — Z98891 History of uterine scar from previous surgery: Secondary | ICD-10-CM

## 2017-08-20 LAB — RAPID URINE DRUG SCREEN, HOSP PERFORMED
Amphetamines: NOT DETECTED
BARBITURATES: NOT DETECTED
BENZODIAZEPINES: NOT DETECTED
COCAINE: NOT DETECTED
OPIATES: NOT DETECTED
TETRAHYDROCANNABINOL: POSITIVE — AB

## 2017-08-20 LAB — POCT I-STAT EG7
ACID-BASE DEFICIT: 8 mmol/L — AB (ref 0.0–2.0)
BICARBONATE: 17.3 mmol/L — AB (ref 20.0–28.0)
CALCIUM ION: 1.24 mmol/L (ref 1.15–1.40)
HEMATOCRIT: 42 % (ref 36.0–46.0)
HEMOGLOBIN: 14.3 g/dL (ref 12.0–15.0)
O2 Saturation: 100 %
PH VEN: 7.317 (ref 7.250–7.430)
POTASSIUM: 3.9 mmol/L (ref 3.5–5.1)
SODIUM: 137 mmol/L (ref 135–145)
TCO2: 18 mmol/L — ABNORMAL LOW (ref 22–32)
pCO2, Ven: 33.8 mmHg — ABNORMAL LOW (ref 44.0–60.0)
pO2, Ven: 213 mmHg — ABNORMAL HIGH (ref 32.0–45.0)

## 2017-08-20 LAB — BPAM RBC
BLOOD PRODUCT EXPIRATION DATE: 201906042359
Blood Product Expiration Date: 201906042359
Unit Type and Rh: 9500
Unit Type and Rh: 9500

## 2017-08-20 LAB — TYPE AND SCREEN
ABO/RH(D): O POS
ANTIBODY SCREEN: NEGATIVE

## 2017-08-20 LAB — APTT: APTT: 22 s — AB (ref 24–36)

## 2017-08-20 LAB — CBC
HCT: 40.1 % (ref 36.0–46.0)
Hemoglobin: 13.6 g/dL (ref 12.0–15.0)
MCH: 29.4 pg (ref 26.0–34.0)
MCHC: 33.9 g/dL (ref 30.0–36.0)
MCV: 86.8 fL (ref 78.0–100.0)
PLATELETS: 259 10*3/uL (ref 150–400)
RBC: 4.62 MIL/uL (ref 3.87–5.11)
RDW: 14.8 % (ref 11.5–15.5)
WBC: 22.4 10*3/uL — AB (ref 4.0–10.5)

## 2017-08-20 SURGERY — Surgical Case
Anesthesia: General

## 2017-08-20 MED ORDER — LIDOCAINE HCL (PF) 1 % IJ SOLN
INTRAMUSCULAR | Status: AC
  Filled 2017-08-20: qty 30

## 2017-08-20 MED ORDER — MENTHOL 3 MG MT LOZG: 1.0000 | LOZENGE | OROMUCOSAL | Status: DC | PRN

## 2017-08-20 MED ORDER — IBUPROFEN 600 MG PO TABS
600.0000 mg | ORAL_TABLET | Freq: Four times a day (QID) | ORAL | Status: DC
  Administered 2017-08-20 – 2017-08-21 (×3): 600 mg via ORAL
  Filled 2017-08-20 (×3): qty 1

## 2017-08-20 MED ORDER — ONDANSETRON HCL 4 MG/2ML IJ SOLN
INTRAMUSCULAR | Status: DC | PRN
  Administered 2017-08-20: 4 mg via INTRAVENOUS

## 2017-08-20 MED ORDER — MEPERIDINE HCL 25 MG/ML IJ SOLN: 6.2500 mg | INTRAMUSCULAR | Status: DC | PRN

## 2017-08-20 MED ORDER — FENTANYL CITRATE (PF) 250 MCG/5ML IJ SOLN
INTRAMUSCULAR | Status: AC
  Filled 2017-08-20: qty 5

## 2017-08-20 MED ORDER — CEFAZOLIN SODIUM-DEXTROSE 2-4 GM/100ML-% IV SOLN
INTRAVENOUS | Status: AC
  Filled 2017-08-20: qty 100

## 2017-08-20 MED ORDER — TETANUS-DIPHTH-ACELL PERTUSSIS 5-2.5-18.5 LF-MCG/0.5 IM SUSP: 0.5000 mL | Freq: Once | INTRAMUSCULAR | Status: DC

## 2017-08-20 MED ORDER — LACTATED RINGERS IV SOLN
INTRAVENOUS | Status: DC | PRN
  Administered 2017-08-20: 16:00:00 via INTRAVENOUS

## 2017-08-20 MED ORDER — SUCCINYLCHOLINE CHLORIDE 20 MG/ML IJ SOLN
INTRAMUSCULAR | Status: DC | PRN
  Administered 2017-08-20: 140 mg via INTRAVENOUS

## 2017-08-20 MED ORDER — METHYLERGONOVINE MALEATE 0.2 MG/ML IJ SOLN
INTRAMUSCULAR | Status: AC
  Filled 2017-08-20: qty 1

## 2017-08-20 MED ORDER — DEXAMETHASONE SODIUM PHOSPHATE 10 MG/ML IJ SOLN
INTRAMUSCULAR | Status: DC | PRN
  Administered 2017-08-20: 10 mg via INTRAVENOUS

## 2017-08-20 MED ORDER — SODIUM CHLORIDE 0.9 % IV SOLN
INTRAVENOUS | Status: DC | PRN
  Administered 2017-08-20: 16:00:00 via INTRAVENOUS

## 2017-08-20 MED ORDER — LACTATED RINGERS IV SOLN
INTRAVENOUS | Status: DC
  Administered 2017-08-21: via INTRAVENOUS

## 2017-08-20 MED ORDER — SODIUM CHLORIDE 0.9 % IR SOLN
Status: DC | PRN
  Administered 2017-08-20: 1000 mL

## 2017-08-20 MED ORDER — HYDROMORPHONE HCL 1 MG/ML IJ SOLN
INTRAMUSCULAR | Status: DC | PRN
  Administered 2017-08-20 (×2): 1 mg via INTRAVENOUS

## 2017-08-20 MED ORDER — ZOLPIDEM TARTRATE 5 MG PO TABS: 5.0000 mg | ORAL_TABLET | Freq: Every evening | ORAL | Status: DC | PRN

## 2017-08-20 MED ORDER — WITCH HAZEL-GLYCERIN EX PADS
1.0000 "application " | MEDICATED_PAD | CUTANEOUS | Status: DC | PRN
  Administered 2017-08-22: 1 via TOPICAL

## 2017-08-20 MED ORDER — ACETAMINOPHEN 325 MG PO TABS: 650.0000 mg | ORAL_TABLET | ORAL | Status: DC | PRN

## 2017-08-20 MED ORDER — DEXAMETHASONE SODIUM PHOSPHATE 10 MG/ML IJ SOLN
INTRAMUSCULAR | Status: AC
  Filled 2017-08-20: qty 1

## 2017-08-20 MED ORDER — METHYLERGONOVINE MALEATE 0.2 MG/ML IJ SOLN
INTRAMUSCULAR | Status: DC | PRN
  Administered 2017-08-20: 0.2 mg via INTRAMUSCULAR

## 2017-08-20 MED ORDER — PROPOFOL 10 MG/ML IV BOLUS
INTRAVENOUS | Status: AC
  Filled 2017-08-20: qty 20

## 2017-08-20 MED ORDER — ENOXAPARIN SODIUM 40 MG/0.4ML ~~LOC~~ SOLN
40.0000 mg | SUBCUTANEOUS | Status: DC
  Administered 2017-08-21 – 2017-08-22 (×2): 40 mg via SUBCUTANEOUS
  Filled 2017-08-20 (×2): qty 0.4

## 2017-08-20 MED ORDER — PROPOFOL 10 MG/ML IV BOLUS
INTRAVENOUS | Status: DC | PRN
  Administered 2017-08-20: 200 mg via INTRAVENOUS

## 2017-08-20 MED ORDER — MIDAZOLAM HCL 2 MG/2ML IJ SOLN: 0.5000 mg | Freq: Once | INTRAMUSCULAR | Status: DC | PRN

## 2017-08-20 MED ORDER — SCOPOLAMINE 1 MG/3DAYS TD PT72
MEDICATED_PATCH | TRANSDERMAL | Status: DC | PRN
  Administered 2017-08-20: 1 via TRANSDERMAL

## 2017-08-20 MED ORDER — TRANEXAMIC ACID 1000 MG/10ML IV SOLN
1000.0000 mg | Freq: Once | INTRAVENOUS | Status: AC
  Administered 2017-08-20: 1000 mg via INTRAVENOUS
  Filled 2017-08-20: qty 1100

## 2017-08-20 MED ORDER — MIDAZOLAM HCL 2 MG/2ML IJ SOLN
INTRAMUSCULAR | Status: DC | PRN
  Administered 2017-08-20: 2 mg via INTRAVENOUS

## 2017-08-20 MED ORDER — COCONUT OIL OIL
1.0000 "application " | TOPICAL_OIL | Status: DC | PRN
  Administered 2017-08-21: 1 via TOPICAL
  Filled 2017-08-20 (×2): qty 120

## 2017-08-20 MED ORDER — HYDROMORPHONE HCL 1 MG/ML IJ SOLN
INTRAMUSCULAR | Status: AC
  Filled 2017-08-20: qty 1

## 2017-08-20 MED ORDER — BUPIVACAINE HCL (PF) 0.5 % IJ SOLN
INTRAMUSCULAR | Status: AC
  Filled 2017-08-20: qty 30

## 2017-08-20 MED ORDER — SCOPOLAMINE 1 MG/3DAYS TD PT72
MEDICATED_PATCH | TRANSDERMAL | Status: AC
  Filled 2017-08-20: qty 1

## 2017-08-20 MED ORDER — OXYTOCIN 10 UNIT/ML IJ SOLN
INTRAMUSCULAR | Status: AC
  Filled 2017-08-20: qty 1

## 2017-08-20 MED ORDER — DIPHENHYDRAMINE HCL 25 MG PO CAPS: 25.0000 mg | ORAL_CAPSULE | Freq: Four times a day (QID) | ORAL | Status: DC | PRN

## 2017-08-20 MED ORDER — FENTANYL CITRATE (PF) 100 MCG/2ML IJ SOLN
INTRAMUSCULAR | Status: DC | PRN
  Administered 2017-08-20: 250 ug via INTRAVENOUS

## 2017-08-20 MED ORDER — ONDANSETRON HCL 4 MG/2ML IJ SOLN
INTRAMUSCULAR | Status: AC
Start: 2017-08-20 — End: 2017-08-20
  Filled 2017-08-20: qty 2

## 2017-08-20 MED ORDER — MEASLES, MUMPS & RUBELLA VAC ~~LOC~~ INJ: 0.5000 mL | INJECTION | Freq: Once | SUBCUTANEOUS | Status: DC

## 2017-08-20 MED ORDER — BUPIVACAINE HCL (PF) 0.5 % IJ SOLN
INTRAMUSCULAR | Status: DC | PRN
  Administered 2017-08-20: 30 mL

## 2017-08-20 MED ORDER — MORPHINE SULFATE (PF) 4 MG/ML IV SOLN
INTRAVENOUS | Status: AC
  Filled 2017-08-20: qty 1

## 2017-08-20 MED ORDER — MAGNESIUM HYDROXIDE 400 MG/5ML PO SUSP: 30.0000 mL | ORAL | Status: DC | PRN

## 2017-08-20 MED ORDER — DIBUCAINE 1 % RE OINT
1.0000 "application " | TOPICAL_OINTMENT | RECTAL | Status: DC | PRN
  Administered 2017-08-22: 1 via RECTAL
  Filled 2017-08-20: qty 28

## 2017-08-20 MED ORDER — PROMETHAZINE HCL 25 MG/ML IJ SOLN: 6.2500 mg | INTRAMUSCULAR | Status: DC | PRN

## 2017-08-20 MED ORDER — FERROUS SULFATE 325 (65 FE) MG PO TABS
325.0000 mg | ORAL_TABLET | Freq: Two times a day (BID) | ORAL | Status: DC
  Administered 2017-08-21 – 2017-08-22 (×3): 325 mg via ORAL
  Filled 2017-08-20 (×4): qty 1

## 2017-08-20 MED ORDER — OXYTOCIN 10 UNIT/ML IJ SOLN
INTRAVENOUS | Status: DC | PRN
  Administered 2017-08-20: 40 [IU] via INTRAVENOUS

## 2017-08-20 MED ORDER — OXYTOCIN 40 UNITS IN LACTATED RINGERS INFUSION - SIMPLE MED: 2.5000 [IU]/h | INTRAVENOUS | Status: AC

## 2017-08-20 MED ORDER — MIDAZOLAM HCL 2 MG/2ML IJ SOLN
INTRAMUSCULAR | Status: AC
  Filled 2017-08-20: qty 2

## 2017-08-20 MED ORDER — MORPHINE SULFATE (PF) 4 MG/ML IV SOLN
1.0000 mg | INTRAVENOUS | Status: DC | PRN
  Administered 2017-08-20: 2 mg via INTRAVENOUS

## 2017-08-20 MED ORDER — SIMETHICONE 80 MG PO CHEW
80.0000 mg | CHEWABLE_TABLET | ORAL | Status: DC | PRN
  Administered 2017-08-21 – 2017-08-22 (×4): 80 mg via ORAL
  Filled 2017-08-20 (×3): qty 1

## 2017-08-20 MED ORDER — OXYTOCIN 10 UNIT/ML IJ SOLN
INTRAMUSCULAR | Status: AC
  Filled 2017-08-20: qty 4

## 2017-08-20 MED ORDER — PRENATAL MULTIVITAMIN CH
1.0000 | ORAL_TABLET | Freq: Every day | ORAL | Status: DC
  Administered 2017-08-21 – 2017-08-22 (×2): 1 via ORAL
  Filled 2017-08-20 (×2): qty 1

## 2017-08-20 MED ORDER — SIMETHICONE 80 MG PO CHEW
80.0000 mg | CHEWABLE_TABLET | ORAL | Status: DC
  Administered 2017-08-20 – 2017-08-22 (×3): 80 mg via ORAL
  Filled 2017-08-20 (×6): qty 1

## 2017-08-20 MED ORDER — OXYCODONE-ACETAMINOPHEN 5-325 MG PO TABS
2.0000 | ORAL_TABLET | ORAL | Status: DC | PRN
  Administered 2017-08-21 (×2): 2 via ORAL
  Filled 2017-08-20 (×2): qty 2

## 2017-08-20 MED ORDER — SENNOSIDES-DOCUSATE SODIUM 8.6-50 MG PO TABS
2.0000 | ORAL_TABLET | ORAL | Status: DC
  Administered 2017-08-20 – 2017-08-22 (×4): 2 via ORAL
  Filled 2017-08-20 (×4): qty 2

## 2017-08-20 MED ORDER — OXYCODONE-ACETAMINOPHEN 5-325 MG PO TABS
1.0000 | ORAL_TABLET | ORAL | Status: DC | PRN
  Administered 2017-08-20 – 2017-08-22 (×6): 1 via ORAL
  Filled 2017-08-20 (×6): qty 1

## 2017-08-20 SURGICAL SUPPLY — 31 items
CHLORAPREP W/TINT 26ML (MISCELLANEOUS) ×3 IMPLANT
CLAMP CORD UMBIL (MISCELLANEOUS) ×2 IMPLANT
CLOTH BEACON ORANGE TIMEOUT ST (SAFETY) ×3 IMPLANT
DRSG OPSITE POSTOP 4X10 (GAUZE/BANDAGES/DRESSINGS) ×3 IMPLANT
ELECT REM PT RETURN 9FT ADLT (ELECTROSURGICAL) ×3
ELECTRODE REM PT RTRN 9FT ADLT (ELECTROSURGICAL) ×1 IMPLANT
GLOVE BIOGEL PI IND STRL 7.0 (GLOVE) ×3 IMPLANT
GLOVE BIOGEL PI INDICATOR 7.0 (GLOVE) ×6
GLOVE ECLIPSE 7.0 STRL STRAW (GLOVE) ×3 IMPLANT
GOWN STRL REUS W/TWL LRG LVL3 (GOWN DISPOSABLE) ×6 IMPLANT
KIT ABG SYR 3ML LUER SLIP (SYRINGE) ×2 IMPLANT
NDL HYPO 25X5/8 SAFETYGLIDE (NEEDLE) ×1 IMPLANT
NEEDLE HYPO 22GX1.5 SAFETY (NEEDLE) ×3 IMPLANT
NEEDLE HYPO 25X5/8 SAFETYGLIDE (NEEDLE) ×6 IMPLANT
NS IRRIG 1000ML POUR BTL (IV SOLUTION) ×3 IMPLANT
PACK C SECTION WH (CUSTOM PROCEDURE TRAY) ×3 IMPLANT
PAD ABD 7.5X8 STRL (GAUZE/BANDAGES/DRESSINGS) ×3 IMPLANT
PAD ABD 8X7 1/2 STERILE (GAUZE/BANDAGES/DRESSINGS) ×2 IMPLANT
PAD OB MATERNITY 4.3X12.25 (PERSONAL CARE ITEMS) ×3 IMPLANT
PENCIL SMOKE EVAC W/HOLSTER (ELECTROSURGICAL) ×3 IMPLANT
SPONGE GAUZE 4X4 12PLY STER LF (GAUZE/BANDAGES/DRESSINGS) ×4 IMPLANT
SUT PDS AB 0 CTX 36 PDP370T (SUTURE) ×2 IMPLANT
SUT PLAIN 2 0 XLH (SUTURE) ×2 IMPLANT
SUT VIC AB 0 CTX 36 (SUTURE) ×12
SUT VIC AB 0 CTX36XBRD ANBCTRL (SUTURE) ×2 IMPLANT
SUT VIC AB 2-0 CT1 (SUTURE) ×2 IMPLANT
SUT VIC AB 4-0 KS 27 (SUTURE) ×5 IMPLANT
SYR CONTROL 10ML LL (SYRINGE) ×3 IMPLANT
TAPE CLOTH SURG 4X10 WHT LF (GAUZE/BANDAGES/DRESSINGS) ×2 IMPLANT
TOWEL OR 17X24 6PK STRL BLUE (TOWEL DISPOSABLE) ×3 IMPLANT
TRAY FOLEY W/BAG SLVR 14FR LF (SET/KITS/TRAYS/PACK) ×3 IMPLANT

## 2017-08-20 NOTE — MAU Note (Signed)
Picked up pt in wc from main lobby, pt on ground. Contracting.  No bleeding or leaking.  Was 2 cm when last was checked.  2nd baby.

## 2017-08-20 NOTE — Anesthesia Procedure Notes (Addendum)
Procedure Name: Intubation Date/Time: 08/20/2017 3:45 PM Performed by: Junious Silk, CRNA Pre-anesthesia Checklist: Patient identified, Emergency Drugs available, Suction available, Patient being monitored and Timeout performed Patient Re-evaluated:Patient Re-evaluated prior to induction Oxygen Delivery Method: Circle system utilized Preoxygenation: Pre-oxygenation with 100% oxygen Induction Type: IV induction, Cricoid Pressure applied and Rapid sequence Laryngoscope Size: Glidescope and 3 Grade View: Grade I Tube type: Oral Tube size: 7.0 mm Number of attempts: 1 Airway Equipment and Method: Video-laryngoscopy Placement Confirmation: ETT inserted through vocal cords under direct vision,  positive ETCO2,  CO2 detector and breath sounds checked- equal and bilateral Secured at: 18 cm Tube secured with: Tape Dental Injury: Teeth and Oropharynx as per pre-operative assessment

## 2017-08-20 NOTE — H&P (Signed)
Brittany Williams is a 22 y.o. female presenting for management of active labor. OB History    Gravida  2   Para  1   Term  1   Preterm      AB      Living  1     SAB      TAB      Ectopic      Multiple  0   Live Births  1          Past Medical History:  Diagnosis Date  . Abnormal Pap smear   . Anxiety   . Asthma   . Chlamydia   . Gonorrhea   . Hypoglycemia   . Ovarian cyst    Past Surgical History:  Procedure Laterality Date  . APPENDECTOMY    . CYSTECTOMY    . WISDOM TOOTH EXTRACTION     Family History: family history includes Diabetes in her other; Hypertension in her other. Social History:  reports that she quit smoking about 2 years ago. Her smoking use included cigarettes. She smoked 0.50 packs per day. She has never used smokeless tobacco. She reports that she does not drink alcohol or use drugs.     Maternal Diabetes: Yes:  Diabetes Type:  Diet controlled Genetic Screening: unknown Maternal Ultrasounds/Referrals: Normal Fetal Ultrasounds or other Referrals:  Other: unknown Maternal Substance Abuse:  No Significant Maternal Medications:  None Significant Maternal Lab Results:  None Other Comments:  None  ROS : unable to perform History: unable to perform - see addendum   Last menstrual period 11/18/2016. Exam Physical Exam  Prenatal labs: ABO, Rh: O/Positive/-- (10/25 1616) Antibody: Negative (10/25 1616) Rubella: 4.16 (10/25 1616) RPR: Non Reactive (02/12 1047)  HBsAg: Negative (10/25 1616)  HIV: Non Reactive (02/12 1047)  GBS: Negative (04/17 0945)   Assessment/Plan: Patient completely dilated at the time of arrival to the L&D floor. Vacuum attempted by Dr Thedora Hinders no success, then kiwi attempted once, then emergency C/S called  Felisa Bonier, MD, PGY-1 Family Medicine 08/20/2017, 3:44 PM

## 2017-08-20 NOTE — Anesthesia Postprocedure Evaluation (Signed)
Anesthesia Post Note  Patient: Brittany Williams  Procedure(s) Performed: CESAREAN SECTION (N/A )     Patient location during evaluation: PACU Anesthesia Type: General Level of consciousness: awake and alert, oriented and patient cooperative Pain management: pain level controlled Vital Signs Assessment: post-procedure vital signs reviewed and stable Respiratory status: spontaneous breathing, nonlabored ventilation and respiratory function stable Cardiovascular status: blood pressure returned to baseline and stable Postop Assessment: no apparent nausea or vomiting Anesthetic complications: no    Last Vitals:  Vitals:   08/20/17 1700 08/20/17 1715  BP: 129/82 117/74  Pulse: 94 84  Resp: 17 15  Temp:    SpO2: 98% 99%    Last Pain:  Vitals:   08/20/17 1730  TempSrc:   PainSc: 7    Pain Goal:                 Howard Bunte,E. Lillian Tigges

## 2017-08-20 NOTE — Progress Notes (Signed)
Patient admitted. She was nodding off when I tried to explain things to her. Dad told to be alert and watch over mom. Call bell and menu explained only due to mom being extremely sleepy.

## 2017-08-20 NOTE — Progress Notes (Signed)
Patient ID: Brittany Williams, female   DOB: Mar 24, 1996, 24 y.o.   MRN: [id number]  Notified by CNM in MAU that pt was coming to room 167 and appeared to be actively laboring. Cx exam was 9+/vtx with a BBOW at 1528. FHR was in the 90s and so AROM for clear/bloody fluid was performed at 1532. With the next ctx, pt pushed and the lip came close to reducing. FHR was still in the 85-105 range, and so Dr Macon Large was called to eval for vacuum assistance due to fetal bradycardia.  Cam Hai CNM 08/20/2017

## 2017-08-20 NOTE — Anesthesia Preprocedure Evaluation (Signed)
Anesthesia Evaluation  Patient identified by MRN, date of birth, ID band Patient confused  Preop documentation limited or incomplete due to emergent nature of procedure.  Airway       Comment: Emergent, pt uncooperative Dental   Pulmonary former smoker,    breath sounds clear to auscultation       Cardiovascular  Rhythm:Regular Rate:Normal     Neuro/Psych    GI/Hepatic GERD  ,  Endo/Other    Renal/GU      Musculoskeletal   Abdominal   Peds  Hematology   Anesthesia Other Findings Baby's heart rate slow: STAT C -section  Reproductive/Obstetrics (+) Pregnancy                             Anesthesia Physical Anesthesia Plan  ASA: II and emergent  Anesthesia Plan: General   Post-op Pain Management:    Induction: Intravenous and Rapid sequence  PONV Risk Score and Plan: 3 and Ondansetron, Dexamethasone and Treatment may vary due to age or medical condition  Airway Management Planned: Oral ETT and Video Laryngoscope Planned  Additional Equipment:   Intra-op Plan:   Post-operative Plan: Extubation in OR  Informed Consent:   Dental advisory given and Only emergency history available  Plan Discussed with: CRNA and Surgeon  Anesthesia Plan Comments: (Emergent C-section, no labs available, pt confused, no history available, GETA, emergent release blood if necessary)        Anesthesia Quick Evaluation

## 2017-08-20 NOTE — Transfer of Care (Signed)
Immediate Anesthesia Transfer of Care Note  Patient: Brittany Williams  Procedure(s) Performed: CESAREAN SECTION (N/A )  Patient Location: PACU  Anesthesia Type:General  Level of Consciousness: awake, alert  and oriented  Airway & Oxygen Therapy: Patient Spontanous Breathing and Patient connected to nasal cannula oxygen  Post-op Assessment: Report given to RN and Post -op Vital signs reviewed and stable  Post vital signs: Reviewed and stable  Last Vitals:  Vitals Value Taken Time  BP    Temp    Pulse    Resp    SpO2      Last Pain: There were no vitals filed for this visit.       Complications: No apparent anesthesia complications

## 2017-08-20 NOTE — Op Note (Addendum)
Brittany Williams PROCEDURE DATE: 08/20/2017  PREOPERATIVE DIAGNOSES: Intrauterine pregnancy at [redacted]w[redacted]d weeks gestation; fetal bradycardia; failed vacuum bradycardia  POSTOPERATIVE DIAGNOSES: The same; placental abruption  PROCEDURE: Primary Stat Low Transverse Cesarean Section  SURGEON:  Dr. Jaynie Collins  ANESTHESIOLOGY TEAM: Anesthesiologist: Jairo Ben, MD CRNA: Junious Silk, CRNA  INDICATIONS: Brittany Williams is a 22 y.o. G2P1001 at [redacted]w[redacted]d here for cesarean section secondary to the indications listed under preoperative diagnoses; please see preoperative notes for further details.  Vacuum delivery was attempted but there was poor maternal effort and this was aborted. Code Cesarean was called given persistent fetal bradycardia. The patient concurred with the proposed plan, giving verbal consent for the procedure.    FINDINGS:  Viable female infant in cephalic presentation.  Apgars 7 and 9; reported arterial cord pH 7.1.  Very bloody amniotic fluid consistent with placental abruption.  Intact placenta, three vessel cord.  Very atonic uterus after delivery; administered methergine, pitocin, TXA.  Normal uterus, fallopian tubes and ovaries bilaterally. Given her presentation and intraamniotic bleeding concerning for abruption with no clear etiology, urine drug screen was ordered as part of routine evaluation for this scenario.   ANESTHESIA: General  INTRAVENOUS FLUIDS: 1600 ml   ESTIMATED BLOOD LOSS: 947 ml URINE OUTPUT:  25 ml SPECIMENS: Placenta sent to pathology COMPLICATIONS: None immediate  PROCEDURE IN DETAIL:  The patient was urgently taken to the the operating room her epidural anesthesia was dosed up to surgical level and was found to be adequate. She was then placed in a dorsal supine position with a leftward tilt, prepped quickly with betadine and draped in a sterile manner.  She already had a foley catheter in her bladder from L&D.  After a timeout was performed, a  Pfannenstiel skin incision was made with scalpel and carried through to the underlying layer of fascia. The fascial incision was extended bilaterally in a blunt fashion.  The fascia was separated from underlying rectus muscles bluntly.  The rectus muscles were separated in the midline bluntly and the peritoneum was entered bluntly. Attention was turned to the lower uterine segment where a low transverse hysterotomy was made with a scalpel and extended bilaterally bluntly.  The infant was successfully delivered, the cord was clamped and cut and the infant was handed over to awaiting neonatology team. Incision to delivery time was less than two minutes.  The placenta was delivered intact and had a three-vessel cord. The uterus was then cleared of clot and debris.  The hysterotomy was closed with 0 Vicryl in a running locked fashion, and an imbricating layer was also placed with 0 Vicryl. The pelvis was cleared of all clot and debris. Hemostasis was confirmed on all surfaces. The fascia was then closed using 0 Vicryl in a running fashion.  The subcutaneous layer was irrigated and the skin was closed with a 4-0 Vicryl subcuticular stitch. The patient tolerated the procedure well. Sponge, instrument and needle counts were correct x 3.  She was taken to the recovery room in stable condition.    Jaynie Collins, MD, FACOG Obstetrician & Gynecologist, [facility] for [employer], [facility]

## 2017-08-21 ENCOUNTER — Encounter: Payer: Medicaid Other | Admitting: Certified Nurse Midwife

## 2017-08-21 ENCOUNTER — Encounter (HOSPITAL_COMMUNITY): Payer: Self-pay | Admitting: Obstetrics & Gynecology

## 2017-08-21 LAB — CBC
HCT: 32.1 % — ABNORMAL LOW (ref 36.0–46.0)
Hemoglobin: 10.8 g/dL — ABNORMAL LOW (ref 12.0–15.0)
MCH: 29.4 pg (ref 26.0–34.0)
MCHC: 33.6 g/dL (ref 30.0–36.0)
MCV: 87.5 fL (ref 78.0–100.0)
PLATELETS: 203 10*3/uL (ref 150–400)
RBC: 3.67 MIL/uL — AB (ref 3.87–5.11)
RDW: 14.8 % (ref 11.5–15.5)
WBC: 26.7 10*3/uL — AB (ref 4.0–10.5)

## 2017-08-21 LAB — COMPREHENSIVE METABOLIC PANEL
ALT: 21 U/L (ref 14–54)
AST: 34 U/L (ref 15–41)
Albumin: 2.3 g/dL — ABNORMAL LOW (ref 3.5–5.0)
Alkaline Phosphatase: 168 U/L — ABNORMAL HIGH (ref 38–126)
Anion gap: 9 (ref 5–15)
CHLORIDE: 104 mmol/L (ref 101–111)
CO2: 20 mmol/L — AB (ref 22–32)
CREATININE: 0.65 mg/dL (ref 0.44–1.00)
Calcium: 8.5 mg/dL — ABNORMAL LOW (ref 8.9–10.3)
Glucose, Bld: 131 mg/dL — ABNORMAL HIGH (ref 65–99)
POTASSIUM: 4.1 mmol/L (ref 3.5–5.1)
SODIUM: 133 mmol/L — AB (ref 135–145)
Total Bilirubin: 0.3 mg/dL (ref 0.3–1.2)
Total Protein: 4.9 g/dL — ABNORMAL LOW (ref 6.5–8.1)

## 2017-08-21 LAB — GLUCOSE, CAPILLARY: GLUCOSE-CAPILLARY: 103 mg/dL — AB (ref 65–99)

## 2017-08-21 LAB — RPR: RPR Ser Ql: NONREACTIVE

## 2017-08-21 MED ORDER — IBUPROFEN 600 MG PO TABS
600.0000 mg | ORAL_TABLET | Freq: Four times a day (QID) | ORAL | Status: DC
  Administered 2017-08-21 – 2017-08-22 (×4): 600 mg via ORAL
  Filled 2017-08-21 (×5): qty 1

## 2017-08-21 MED ORDER — IBUPROFEN 600 MG PO TABS
600.0000 mg | ORAL_TABLET | Freq: Four times a day (QID) | ORAL | Status: DC
  Administered 2017-08-22 – 2017-08-23 (×3): 600 mg via ORAL
  Filled 2017-08-21 (×3): qty 1

## 2017-08-21 MED ORDER — KETOROLAC TROMETHAMINE 30 MG/ML IJ SOLN: 30.0000 mg | Freq: Four times a day (QID) | INTRAMUSCULAR | Status: DC

## 2017-08-21 NOTE — Progress Notes (Addendum)
Post Partum Day 1 Subjective: Brittany Williams is a 22 y/o G2P1001 on POD#1 s/p pLTCS for NRFHR at [redacted]w[redacted]d gestation. Patient is doing well but admits to moderate to severe pain around incision site and right shoulder/right rib pain which she rates a 5/10 for severity. Patient reports her pain is only mildly controlled on pain medication. No significant events overnight. Patient is tolerating po well and ambulating without difficulties. Patient denies BM/flatus. Lochia appropriate.   Objective: Blood pressure 110/61, pulse (!) 59, temperature 98 F (36.7 C), temperature source Oral, resp. rate 16, height 5' (1.524 m), weight 74.8 kg (165 lb), last menstrual period 11/18/2016, SpO2 96 %.  Physical Exam:  General: alert, cooperative, appears stated age and no distress  Chest: clear to auscultation bilaterally Heart: regular rate and rhythm  Abdomen: soft, moderate tenderness to palpation throughout abdomen Lochia: appropriate Uterine Fundus: firm Incision: Unable to visualize due to wrapping, but no blood/drainage noted on bandage. DVT Evaluation: No evidence of DVT seen on physical exam. Negative Homan's sign.  Recent Labs    08/20/17 1556 08/21/17 0521  HGB 14.3 10.8*  HCT 42.0 32.1*    Assessment/Plan: Plan for discharge tomorrow, Discharge home, Breastfeeding and Contraception pills Brittany Williams is a 22 y/o G2P1001 on POD#1 s/p pLTCS for NRFHR at [redacted]w[redacted]d gestation.  Contraception: pills Feeding: breast/bottle Discharge: home tomorrow pending patient progression  LOS: 1 day   Caprice Renshaw, PA-S 08/21/2017, 7:54 AM   OB FELLOW POSTPARTUM PROGRESS NOTE ATTESTATION I have seen and examined this patient and agree with above documentation in the resident's note.   POD#1 from C/S. Pain is uncontrolled. - Schedule Toradol IV q6h for 24 hours instead of ibuprofen - Continue oxycodone prn - Lovenox for DVT prophylaxis  Frederik Pear, MD OB Fellow 11:21 AM

## 2017-08-21 NOTE — Lactation Note (Signed)
This note was copied from a baby's chart. Lactation Consultation Note  Patient Name: Brittany Williams [id number] Date: 08/21/2017    [facility] Initial Visit: Family sleeping: infant in basinet Will try to return later for initial visit                Brittany Williams 08/21/2017, 1:05 AM

## 2017-08-21 NOTE — Addendum Note (Signed)
Addendum  created 08/21/17 0851 by Earmon Phoenix, CRNA   Sign clinical note

## 2017-08-21 NOTE — Lactation Note (Addendum)
This note was copied from a baby's chart. Lactation Consultation Note  Patient Name: Girl Yesena Reaves [id number] Date: 08/21/2017 Reason for consult: Initial assessment;Term  P2 mother whose infant is now 19 hours old.  Mother breastfed her 24 year old for 3 months.  Mother nursing infant as I arrived.    Maternal Data Formula Feeding for Exclusion: No Has patient been taught Hand Expression?: Yes Does the patient have breastfeeding experience prior to this delivery?: Yes  Feeding Feeding Type: Breast Fed Length of feed: 15 min(still feeding)  LATCH Score Latch: Grasps breast easily, tongue down, lips flanged, rhythmical sucking.  Audible Swallowing: A few with stimulation  Type of Nipple: Everted at rest and after stimulation  Comfort (Breast/Nipple): Soft / non-tender  Hold (Positioning): Assistance needed to correctly position infant at breast and maintain latch.  LATCH Score: 8  Interventions Interventions: Breast feeding basics reviewed;Assisted with latch;Skin to skin;Breast massage;Breast compression  Lactation Tools Discussed/Used     Consult Status Consult Status: Follow-up Date: 08/22/17 Follow-up type: In-patient    Raea Magallon R Namari Breton 08/21/2017, 3:22 AM

## 2017-08-21 NOTE — Anesthesia Postprocedure Evaluation (Signed)
Anesthesia Post Note  Patient: Brittany Williams  Procedure(s) Performed: CESAREAN SECTION (N/A )     Patient location during evaluation: Mother Baby Anesthesia Type: General Level of consciousness: awake Pain management: pain level controlled Vital Signs Assessment: post-procedure vital signs reviewed and stable Respiratory status: spontaneous breathing Cardiovascular status: stable Postop Assessment: no apparent nausea or vomiting and adequate PO intake Anesthetic complications: no Comments: Spoke with Marrion Coy RN regarding pt and husbands concerns about circumstances immediately prior to stat CS in labor room. She will follow up with involved providers.     Last Vitals:  Vitals:   08/21/17 0200 08/21/17 0601  BP: 112/60 110/61  Pulse: 89 (!) 59  Resp: 18 16  Temp: 37.1 C 36.7 C  SpO2: 96% 96%    Last Pain:  Vitals:   08/21/17 0601  TempSrc: Oral  PainSc: 9    Pain Goal:                 Edison Pace

## 2017-08-21 NOTE — Progress Notes (Signed)
Post Partum Day 1 Subjective:  Brittany Williams is a 22 y.o. G2P1001 [redacted]w[redacted]d s/p stat pLTCS.  No acute events overnight.  Pt denies problems with ambulating, voiding or po intake.  She denies nausea or vomiting.  Pain is well controlled.  She has had flatus.  Lochia Minimal.  Feeding with formula and breastmilk  Objective: Blood pressure (!) 112/58, pulse 66, temperature 97.8 F (36.6 C), temperature source Oral, resp. rate 18, height 5' (1.524 m), weight 165 lb (74.8 kg), last menstrual period 11/18/2016, SpO2 98 %.  Physical Exam:  General: alert, cooperative and no distress Lochia:normal flow Chest: normal WOB Heart: Regular rate Abdomen: +BS, soft, mild TTP (appropriate) Uterine Fundus: firm DVT Evaluation: No evidence of DVT seen on physical exam. Extremities: No edema  Recent Labs    08/20/17 1556 08/21/17 0521  HGB 14.3 10.8*  HCT 42.0 32.1*    Assessment/Plan:  ASSESSMENT: Brittany Williams is a 22 y.o. G2P1001 [redacted]w[redacted]d s/p pLTCS- stat  #Stat CS- Has lengthy discussion about the events of the prior day. Explained reason for vacuum and stat CS. Helped to give context to events and family seemed appreciative.   Plan for discharge tomorrow Continue routine PP care Breastfeeding support PRN   LOS: 1 day    Federico Flake 08/21/2017, 10:15 AM

## 2017-08-21 NOTE — Progress Notes (Signed)
Patient told to call when she wants percocets. Patient told to ambulate halls and use incentive spirometer . Patient told plan of care for today and to keep up with yellow sheet today.

## 2017-08-21 NOTE — Progress Notes (Signed)
MOB was referred for history of depression/anxiety. * Referral screened out by Clinical Social Worker because none of the following criteria appear to apply: ~ History of anxiety/depression during this pregnancy, or of post-partum depression. ~ Diagnosis of anxiety and/or depression within last 3 years OR * MOB's symptoms currently being treated with medication and/or therapy. Please contact the Clinical Social Worker if needs arise, by MOB request, or if MOB scores greater than 9/yes to question 10 on Edinburgh Postpartum Depression Screen.  CSW assessment completed when MOB delivered her first child in 2017.  No acute concerns noted and PMAD education provided. 

## 2017-08-22 ENCOUNTER — Encounter (HOSPITAL_COMMUNITY): Payer: Self-pay | Admitting: *Deleted

## 2017-08-22 MED ORDER — FLEET PEDIATRIC 3.5-9.5 GM/59ML RE ENEM
1.0000 | ENEMA | Freq: Once | RECTAL | Status: DC
  Filled 2017-08-22: qty 1

## 2017-08-22 MED ORDER — FLEET ENEMA 7-19 GM/118ML RE ENEM
1.0000 | ENEMA | Freq: Once | RECTAL | Status: AC
  Administered 2017-08-22: 1 via RECTAL

## 2017-08-22 NOTE — Progress Notes (Signed)
CSW acknowledges consult and completed clinical assessment.  Clinical documentation will follow as well as a report to [place] CPS for positive UDS for THC.  There are no barriers to d/c.  Sabre Leonetti Boyd-Gilyard, MSW, LCSW Clinical Social Work [phone]   

## 2017-08-22 NOTE — Progress Notes (Addendum)
Post Partum Day 2 Subjective: no complaints, up ad lib, voiding and tolerating PO. No flatus or BM. Yet. Pt would like to go home today.  Objective: Blood pressure 119/78, pulse 79, temperature 97.8 F (36.6 C), temperature source Oral, resp. rate 18, height 5' (1.524 m), weight 165 lb (74.8 kg), last menstrual period 11/18/2016, SpO2 96 %.  Physical Exam:  General: alert, cooperative and no distress Lungs: CTAB Heart: regular rate and rhythm Abdomen: soft, nontender, BS x4 Lochia: appropriate Uterine Fundus: firm Incision: healing well, no significant drainage, no dehiscence, no significant erythema DVT Evaluation: No evidence of DVT seen on physical exam. Negative Homan's sign. No cords or calf tenderness. No significant calf/ankle edema.  Recent Labs    08/20/17 1556 08/21/17 0521  HGB 14.3 10.8*  HCT 42.0 32.1*    Assessment/Plan: S/p CS POD #2 Good BS but no flatus or BM since surgery, encouraged to walk in halls and consume warm fluids, will reevaluate later today and consider discharge home   LOS: 2 days   Donette Larry, CNM 08/22/2017, 12:58 PM

## 2017-08-22 NOTE — Clinical Social Work Maternal (Signed)
CLINICAL SOCIAL WORK MATERNAL/CHILD NOTE  Patient Details  Name: Brittany Williams MRN: [id number] Date of Birth: [birth year]  Date:  08/22/2017  Clinical Social Worker Initiating Note:  Laurey Arrow Date/Time: Initiated:  08/22/17/1300     Child's Name:  Christen Bame   Biological Parents:  Mother, Father   Need for Interpreter:  None   Reason for Referral:  Current Substance Use/Substance Use During Pregnancy    Address:  [place] [address]    Phone number:  [phone] (home)     Additional phone number: FOB's number : [phone] Household Members/Support Persons (HM/SP):   Household Member/Support Person 1, Household Member/Support Person 2   HM/SP Name Relationship DOB or Age  HM/SP -1 Gracey Maricela Bo daughter 05/23/2015  HM/SP -2 Buel Ream FOB 08/21/1988  HM/SP -3        HM/SP -4        HM/SP -5        HM/SP -6        HM/SP -7        HM/SP -8          Natural Supports (not living in the home):  Immediate Family, Friends, Armed forces technical officer, Extended Family   Professional Supports: None   Employment: Unemployed   Type of Work:     Education:  Programmer, systems   Homebound arranged:    Museum/gallery curator Resources:  [employer]   Other Resources:  Physicist, medical (CSW provided MOB with information to apply for [facility].)   Cultural/Religious Considerations Which May Impact Care:  None Reported  Strengths:  Ability to meet basic needs , Engineer, materials, Home prepared for child    Psychotropic Medications:         Pediatrician:    [place]olic[place] area  Pediatrician List:   [facility]      Pediatrician Fax Number:    Risk Factors/Current Problems:  DHHS Involvement , Substance Use    Cognitive State:  Able to Concentrate , Alert , Insightful , Linear Thinking    Mood/Affect:  Happy , Comfortable  , Relaxed , Calm , Interested    CSW Assessment: CSW met with MOB in room 125 to complete an assessment for SA (UDS and CDS order due to concerns regarding abruption). When CSW arrived, MOB was resting in bed, MOB's mother was holding infant, and FOB and MOB's toddler infant was observing MGM and infant's interactions. CSW explained CSW's role and with MOB's permission, CSW asked MOB's guest leave the room in order to meet with MOB in private. During the assessment, MOB was polite, easy to engage, forthcoming, and receptive to meeting with CSW.  MOB mood and affect were concurrent for the situation. MOB appeared comfortable with infant and responded appropriately to infant's cues.   CSW asked about MOB's SA hx and MOB openly shared that MOB used marijuana throughout MOB's pregnancy.  MOB stated, "I smoked weed to help decrease my nausea."  Per MOB, MOB experienced nausea throughout pregnancy and prescribed medication by MOB's OB provider was not helpful.  CSW thanked MOB for her honesty.  CSW explained hospital's policy regarding perinatal SA.  CSW made MOB aware that infant's UDS was positive for [facility] and CSW will be making a report to [facility] CPS. CSW also shared with MOB that infant's CDS results will also be reported  to CPS after resulted; MOB was understanding. MOB denied CPS hx and declined resources for SA interventions. CSW explained CPS investigation process and MOB denied having any questions or concerns.   MOB reported having all essential items needed for infant and feeling prepared to parent.   CSW made a report to [facility] CPS.  CPS will make contact with family within 72 hours.  There are no barriers to discharge.   CSW Plan/Description:  No Further Intervention Required/No Barriers to Discharge, Other Information/Referral to [employer], Perinatal Mood and Anxiety Disorder (PMADs) Education, [place], Child Protective Service Report     Laurey Arrow, MSW, LCSW Clinical Social Work [phone]  Dimple Nanas, LCSW 08/22/2017, 1:16 PM

## 2017-08-22 NOTE — Lactation Note (Signed)
This note was copied from a baby's chart. Lactation Consultation Note  Patient Name: Brittany Williams [id number] Date: 08/22/2017   [facility] Follow Up Visit:  Infant being weighed when I arrived.  Mother states breastfeeding is going well and she has no questions/concerns at this time.  Will call as needed for assistance.                Tongela Encinas R Karin Pinedo 08/22/2017, 6:21 AM

## 2017-08-22 NOTE — Lactation Note (Signed)
This note was copied from a baby's chart. Lactation Consultation Note  Patient Name: Brittany Williams [id number] Date: 08/22/2017 Reason for consult: Follow-up assessment   Baby 48 hours old.  Mother did not look up from cell phone while LC in room. Family reports baby is breastfeeding well and recently bf for 45 min. Suggest mother call if she needs further assistance.   Maternal Data    Feeding Feeding Type: Breast Fed Length of feed: 45 min  LATCH Score                   Interventions    Lactation Tools Discussed/Used     Consult Status Consult Status: PRN    Hardie Pulley 08/22/2017, 4:36 PM

## 2017-08-22 NOTE — Progress Notes (Signed)
Fleets enema given rectally while patient was on her left side. Patient retained enema for 15-20 minutes then went to bathroom due to urge. She expelled flatus, no stool, felt some relief of abdominal pressure. Abdomen is soft and non-distended. Patient has a small external hemorrhoid that is causing discomfort. Tuck pads and Dibucaine ointment given with instructions. Patient may have internal hemorrhoids as the tip of the enema caused discomfort and had slight resistance when advancing in rectum. Tip was lubricated. Patient states " it feels like a ball of poop in my butt". Patient reports relief with topical anesthetic on hemorrhoid.

## 2017-08-23 MED ORDER — OXYCODONE-ACETAMINOPHEN 5-325 MG PO TABS: 1.0000 | ORAL_TABLET | Freq: Four times a day (QID) | ORAL | 0 refills | Status: DC | PRN

## 2017-08-23 MED ORDER — FERROUS SULFATE 325 (65 FE) MG PO TABS: 325.0000 mg | ORAL_TABLET | Freq: Two times a day (BID) | ORAL | 0 refills | Status: DC

## 2017-08-23 MED ORDER — IBUPROFEN 600 MG PO TABS: 600.0000 mg | ORAL_TABLET | Freq: Four times a day (QID) | ORAL | 0 refills | Status: DC

## 2017-08-23 NOTE — Discharge Instructions (Signed)

## 2017-08-23 NOTE — Discharge Summary (Signed)
OB Discharge Summary     Patient Name: Brittany Williams DOB: 03-15-1996 MRN: [id number]  Date of admission: 08/20/2017 Delivering MD: Jaynie Collins A   Date of discharge: 08/23/2017  Admitting diagnosis: CTX Intrauterine pregnancy: [redacted]w[redacted]d     Secondary diagnosis:  Active Problems:   S/P emergency cesarean section   S/P cesarean section  Additional problems: GDM     Discharge diagnosis: Term Pregnancy Delivered and GDM A1                                                                                                Post partum procedures:none  Augmentation: none  Complications: None  Hospital course:  Onset of Labor With Unplanned C/S  22 y.o. yo G2P1001 at [redacted]w[redacted]d was admitted in Active Labor on 08/20/2017. Patient had a labor course significant for active labor with prolonged decelartion. Membrane Rupture Time/Date: 3:32 PM ,08/20/2017   The patient went for STAT cesarean section due to Non-Reassuring FHR, and delivered a Viable infant,08/20/2017.  Details of operation can be found in separate operative note. Patient had an uncomplicated postpartum course.  She is ambulating,tolerating a regular diet, passing flatus, and urinating well.  Patient is discharged home in stable condition 08/23/17.  Physical exam  Vitals:   08/21/17 1919 08/22/17 0645 08/22/17 1852 08/23/17 0641  BP: 121/82 119/78 112/67 (!) 100/57  Pulse: 92 79 79 95  Resp: Temp: 97.7 F (36.5 C) 97.8 F (36.6 C) 98 F (36.7 C) 98.6 F (37 C)  TempSrc: Oral Oral Oral Oral  SpO2:    100%  Weight:      Height:       General: alert, cooperative and no distress Lochia: appropriate Uterine Fundus: firm Incision: Dressing is clean, dry, and intact DVT Evaluation: No evidence of DVT seen on physical exam. No cords or calf tenderness. Labs: Lab Results  Component Value Date   WBC 26.7 (H) 08/21/2017   HGB 10.8 (L) 08/21/2017   HCT 32.1 (L) 08/21/2017   MCV 87.5 08/21/2017   PLT 203 08/21/2017    CMP Latest Ref Rng & Units 08/21/2017  Glucose 65 - 99 mg/dL 440(N)  BUN 6 - 20 mg/dL <0(U)  Creatinine 7.25 - 1.00 mg/dL 3.66  Sodium 440 - 347 mmol/L 133(L)  Potassium 3.5 - 5.1 mmol/L 4.1  Chloride 101 - 111 mmol/L 104  CO2 22 - 32 mmol/L 20(L)  Calcium 8.9 - 10.3 mg/dL 4.2(V)  Total Protein 6.5 - 8.1 g/dL 4.9(L)  Total Bilirubin 0.3 - 1.2 mg/dL 0.3  Alkaline Phos 38 - 126 U/L 168(H)  AST 15 - 41 U/L 34  ALT 14 - 54 U/L 21    Discharge instruction: per After Visit Summary and "Baby and Me Booklet".  After visit meds:  Allergies as of 08/23/2017      Reactions   Latex Itching, Rash      Medication List    STOP taking these medications   cromolyn 5.2 MG/ACT nasal spray Commonly known as:  NASALCROM     TAKE these medications   ferrous sulfate  325 (65 FE) MG tablet Take 1 tablet (325 mg total) by mouth 2 (two) times daily with a meal.   ibuprofen 600 MG tablet Commonly known as:  ADVIL,MOTRIN Take 1 tablet (600 mg total) by mouth every 6 (six) hours.   loratadine 10 MG tablet Commonly known as:  CLARITIN Take 1 tablet (10 mg total) by mouth daily.   omeprazole 20 MG capsule Commonly known as:  PRILOSEC Take 1 capsule (20 mg total) by mouth 2 (two) times daily before a meal.   oxyCODONE-acetaminophen 5-325 MG tablet Commonly known as:  PERCOCET/ROXICET Take 1 tablet by mouth every 6 (six) hours as needed for severe pain.   PRENATE PIXIE 10-0.6-0.4-200 MG Caps Take 1 tablet by mouth daily.       Diet: routine diet  Activity: Advance as tolerated. Pelvic rest for 6 weeks.   Outpatient follow up:6 weeks Follow up Appt: Future Appointments  Date Time Provider Department Center  09/03/2017 11:00 AM Orvilla Cornwall A, CNM [facility] None  09/17/2017 10:30 AM Denney, Rachelle A, CNM [facility] None   Follow up Visit:No follow-ups on file.  Postpartum contraception: Combination OCPs  Newborn Data: Live born female  Birth Weight: 6 lb 7.5 oz (2934 g) APGAR:  7, 9  Newborn Delivery   Birth date/time:  08/20/2017 15:46:00 Delivery type:  C-Section, Low Transverse Trial of labor:  No C-section categorization:  Primary     Baby Feeding: Bottle and Breast Disposition:home with mother   08/23/2017 Caryl Ada, DO

## 2017-08-26 ENCOUNTER — Inpatient Hospital Stay (HOSPITAL_COMMUNITY): Admission: RE | Admit: 2017-08-26 | Payer: Medicaid Other | Source: Ambulatory Visit

## 2017-09-03 ENCOUNTER — Ambulatory Visit (INDEPENDENT_AMBULATORY_CARE_PROVIDER_SITE_OTHER): Payer: Medicaid Other | Admitting: Obstetrics

## 2017-09-03 ENCOUNTER — Encounter: Payer: Self-pay | Admitting: Obstetrics

## 2017-09-03 DIAGNOSIS — Z30011 Encounter for initial prescription of contraceptive pills: Secondary | ICD-10-CM

## 2017-09-03 DIAGNOSIS — Z3009 Encounter for other general counseling and advice on contraception: Secondary | ICD-10-CM

## 2017-09-03 DIAGNOSIS — Z1389 Encounter for screening for other disorder: Secondary | ICD-10-CM | POA: Diagnosis not present

## 2017-09-03 DIAGNOSIS — G8918 Other acute postprocedural pain: Secondary | ICD-10-CM

## 2017-09-03 DIAGNOSIS — R1030 Lower abdominal pain, unspecified: Secondary | ICD-10-CM

## 2017-09-03 DIAGNOSIS — O9229 Other disorders of breast associated with pregnancy and the puerperium: Secondary | ICD-10-CM

## 2017-09-03 MED ORDER — NORETHINDRONE 0.35 MG PO TABS: 1.0000 | ORAL_TABLET | Freq: Every day | ORAL | 11 refills | Status: DC

## 2017-09-03 MED ORDER — IBUPROFEN 800 MG PO TABS: 800.0000 mg | ORAL_TABLET | Freq: Three times a day (TID) | ORAL | 5 refills | Status: DC | PRN

## 2017-09-03 MED ORDER — OXYCODONE-ACETAMINOPHEN 5-325 MG PO TABS: 1.0000 | ORAL_TABLET | Freq: Four times a day (QID) | ORAL | 0 refills | Status: DC | PRN

## 2017-09-03 NOTE — Progress Notes (Signed)
Post Partum Exam  Brittany Williams is a 22 y.o. G56P1001 female who presents for a postpartum visit. She is 2 weeks postpartum following a low transverse Cesarean section. I have fully reviewed the prenatal and intrapartum course. The delivery was at 105w1d gestational weeks.  Anesthesia general. Postpartum course has been unremarkabe. Baby's course has been unremarkable. Baby is feeding by breast. Bleeding lightly. . Bowel function is normal. Bladder function is normal. Patient is not sexually active. Contraception method is none. Postpartum depression screening:neg   The following portions of the patient's history were reviewed and updated as appropriate: allergies, current medications, past family history, past medical history, past social history, past surgical history and problem list. Last pap smear done 08-29-2016 and was Normal  Review of Systems A comprehensive review of systems was negative.    Objective:  Blood pressure 118/70, pulse 83, weight 153 lb 14.4 oz (69.8 kg), unknown if currently breastfeeding.  General:  alert and no distress   Breasts:  inspection negative, no nipple discharge or bleeding, no masses or nodularity palpable  Lungs: clear to auscultation bilaterally  Heart:  regular rate and rhythm, S1, S2 normal, no murmur, click, rub or gallop  Abdomen: soft, non-tender; bowel sounds normal; no masses,  no organomegaly and incision is C, D, I.   Assessment:    1. Postpartum care following cesarean delivery - doing well  2. Postoperative lower abdominal pain Rx: - ibuprofen (ADVIL,MOTRIN) 800 MG tablet; Take 1 tablet (800 mg total) by mouth every 8 (eight) hours as needed.  Dispense: 30 tablet; Refill: 5 - oxyCODONE-acetaminophen (PERCOCET/ROXICET) 5-325 MG tablet; Take 1 tablet by mouth every 6 (six) hours as needed for severe pain.  Dispense: 20 tablet; Refill: 0  3. Postpartum nipple pain - All-Purpose Nipple Cream with Lidocaine Rx  4. Encounter for other general  counseling and advice on contraception - wants OCP's.  Breast Feeding  5. Encounter for initial prescription of contraceptive pills Rx: - norethindrone (MICRONOR,CAMILA,ERRIN) 0.35 MG tablet; Take 1 tablet (0.35 mg total) by mouth daily.  Dispense: 1 Package; Refill: 11   Plan:   1. Contraception: Micronor Rx ( Breast Feeding ) 2. All-Purpose Nipple Cream Rx 3. Follow up in: 4 weeks or as needed.    Brock Bad MD 09-03-2017

## 2017-09-13 ENCOUNTER — Encounter (HOSPITAL_COMMUNITY): Payer: Self-pay | Admitting: *Deleted

## 2017-09-17 ENCOUNTER — Ambulatory Visit: Payer: Medicaid Other | Admitting: Certified Nurse Midwife

## 2017-09-20 ENCOUNTER — Telehealth: Payer: Self-pay

## 2017-09-20 NOTE — Telephone Encounter (Signed)
Pt called requesting a letter to return to work. Per Dr. Clearance CootsHarper okay to provide. Pt was seen for postpartum on 09/03/17. Letter provided

## 2017-10-01 ENCOUNTER — Ambulatory Visit (INDEPENDENT_AMBULATORY_CARE_PROVIDER_SITE_OTHER): Payer: Medicaid Other | Admitting: Certified Nurse Midwife

## 2017-10-01 DIAGNOSIS — Z98891 History of uterine scar from previous surgery: Secondary | ICD-10-CM

## 2017-10-01 NOTE — Progress Notes (Signed)
Post Partum Exam  Brittany Williams is a 22 y.o. 72P2003 female who presents for a postpartum visit. She is 6 weeks postpartum following a low cervical transverse Cesarean section. I have fully reviewed the prenatal and intrapartum course. The delivery was at 39 gestational weeks.  Anesthesia: general. Postpartum course has been doing well. Baby's course has been doing well. Baby is feeding by breast. Bleeding no bleeding. Bowel function is abnormal: constipation. Bladder function is normal. Patient is sexually active. Contraception method is oral progesterone-only contraceptive. Postpartum depression screening:neg, score 0.   The following portions of the patient's history were reviewed and updated as appropriate: allergies, current medications, past family history, past medical history, past social history, past surgical history and problem list.  Last pap smear done 08/2016 and was Normal  Review of Systems Pertinent items noted in HPI and remainder of comprehensive ROS otherwise negative.    Objective:  unknown if currently breastfeeding.  General:  alert, cooperative and no distress   Breasts:  inspection negative, no nipple discharge or bleeding, no masses or nodularity palpable  Lungs: clear to auscultation bilaterally  Heart:  regular rate and rhythm, S1, S2 normal, no murmur, click, rub or gallop  Abdomen: soft, non-tender; bowel sounds normal; no masses,  no organomegaly, C/S wound: edges well approximated, no s/s infection noted, non-tender to palpation, no drainage noted, no erythema noted.   Pelvic/Rectal Exam: Not performed.        Assessment & Plan    Normal 4 week postpartum exam. Pap smear not done at today's visit.   1. Contraception: oral progesterone-only contraceptive 2. Follow up in: 6 months annual exam/contraception management or as needed.      F/U lab appointment 2 hour OGTT d/t hx of GDM

## 2017-10-02 ENCOUNTER — Encounter: Payer: Self-pay | Admitting: Certified Nurse Midwife

## 2017-10-08 ENCOUNTER — Other Ambulatory Visit: Payer: Medicaid Other

## 2017-10-16 ENCOUNTER — Ambulatory Visit (INDEPENDENT_AMBULATORY_CARE_PROVIDER_SITE_OTHER): Payer: Medicaid Other | Admitting: Certified Nurse Midwife

## 2017-10-16 ENCOUNTER — Other Ambulatory Visit: Payer: Medicaid Other

## 2017-10-16 DIAGNOSIS — Z539 Procedure and treatment not carried out, unspecified reason: Secondary | ICD-10-CM

## 2017-10-16 DIAGNOSIS — O24410 Gestational diabetes mellitus in pregnancy, diet controlled: Secondary | ICD-10-CM

## 2017-10-16 NOTE — Progress Notes (Signed)
RGYN per appt notes here for F/U.   Last pap: 08/29/2016 WNL

## 2017-10-17 LAB — GLUCOSE TOLERANCE, 2 HOURS W/ 1HR
GLUCOSE, 1 HOUR: 148 mg/dL (ref 65–179)
GLUCOSE, 2 HOUR: 84 mg/dL (ref 65–152)
Glucose, Fasting: 80 mg/dL (ref 65–91)

## 2017-10-22 NOTE — Progress Notes (Signed)
No Exam.  Had f/u 2 hour OGTT testing from pregnancy.

## 2017-10-25 ENCOUNTER — Other Ambulatory Visit: Payer: Self-pay | Admitting: Obstetrics and Gynecology

## 2017-11-22 ENCOUNTER — Telehealth: Payer: Self-pay | Admitting: *Deleted

## 2017-11-22 NOTE — Telephone Encounter (Signed)
Attempt to contact pt regarding Rx change request from pharmacy. Pt request fill on Prenate Pixie.  This Rx has always been covered by Medicaid. Pharmacy states pt may have change in plan.  LM on pt VM making her aware to follow up with case worker to determine her current Medicaid coverage. Advised to contact office if any changes.

## 2018-05-18 ENCOUNTER — Other Ambulatory Visit: Payer: Self-pay

## 2018-05-18 ENCOUNTER — Encounter (HOSPITAL_COMMUNITY): Payer: Self-pay

## 2018-05-18 ENCOUNTER — Inpatient Hospital Stay (HOSPITAL_COMMUNITY)
Admission: AD | Admit: 2018-05-18 | Discharge: 2018-05-18 | Disposition: A | Discharge status: Home / Self Care | Payer: Self-pay | Source: Ambulatory Visit | Attending: Obstetrics & Gynecology | Admitting: Obstetrics & Gynecology

## 2018-05-18 ENCOUNTER — Inpatient Hospital Stay (HOSPITAL_COMMUNITY): Payer: Self-pay

## 2018-05-18 DIAGNOSIS — O209 Hemorrhage in early pregnancy, unspecified: Secondary | ICD-10-CM | POA: Insufficient documentation

## 2018-05-18 DIAGNOSIS — Z3A01 Less than 8 weeks gestation of pregnancy: Secondary | ICD-10-CM | POA: Insufficient documentation

## 2018-05-18 DIAGNOSIS — O208 Other hemorrhage in early pregnancy: Secondary | ICD-10-CM

## 2018-05-18 DIAGNOSIS — Z87891 Personal history of nicotine dependence: Secondary | ICD-10-CM | POA: Insufficient documentation

## 2018-05-18 LAB — URINALYSIS, ROUTINE W REFLEX MICROSCOPIC
Bilirubin Urine: NEGATIVE
Glucose, UA: NEGATIVE mg/dL
Hgb urine dipstick: NEGATIVE
Ketones, ur: 15 mg/dL — AB
NITRITE: NEGATIVE
Protein, ur: NEGATIVE mg/dL
SPECIFIC GRAVITY, URINE: 1.02 (ref 1.005–1.030)
pH: 5.5 (ref 5.0–8.0)

## 2018-05-18 LAB — URINALYSIS, MICROSCOPIC (REFLEX)

## 2018-05-18 LAB — CBC
HCT: 40.4 % (ref 36.0–46.0)
Hemoglobin: 13.7 g/dL (ref 12.0–15.0)
MCH: 30.2 pg (ref 26.0–34.0)
MCHC: 33.9 g/dL (ref 30.0–36.0)
MCV: 89.2 fL (ref 80.0–100.0)
Platelets: 245 10*3/uL (ref 150–400)
RBC: 4.53 MIL/uL (ref 3.87–5.11)
RDW: 14.3 % (ref 11.5–15.5)
WBC: 13.5 10*3/uL — ABNORMAL HIGH (ref 4.0–10.5)
nRBC: 0 % (ref 0.0–0.2)

## 2018-05-18 LAB — HCG, QUANTITATIVE, PREGNANCY: hCG, Beta Chain, Quant, S: 50876 m[IU]/mL — ABNORMAL HIGH (ref <5)

## 2018-05-18 LAB — POCT PREGNANCY, URINE: PREG TEST UR: POSITIVE — AB

## 2018-05-18 NOTE — MAU Note (Signed)
Pt. States she had a pos. HPT about 4 days ago.  Pt noticed some spotting yesterday when she wipes.  No recent intercourse.  Pt also reports some lower abd. Pain that she rates 2/10.  Pt also reports some sharp right shoulder pain that she rates 4/10.

## 2018-05-18 NOTE — Discharge Instructions (Signed)
Your ultrasound was normal. I would recommend following up with your OB as soon as possible for a pelvic exam and repeat labs. I would recommend nothing in the vagina until you are seen again. Please return if the bleeding becomes heavier, you pass tissue, you feel like you're going to pass out, the pain worsens in intensity or you have fevers.

## 2018-05-18 NOTE — MAU Provider Note (Signed)
History     CSN: [id number]  Arrival date and time: 05/18/18 [id number]   First Provider Initiated Contact with Patient 05/18/18 [phone]      Chief Complaint  Patient presents with  . Abdominal Pain  . Shoulder Pain   HPI   Brittany Williams is a 23 y.o. [id number] female at 4083w3d by LMP who presents to MAU with vaginal spotting. Reports she had light pink spotting that started on 1/22. It lasted one day and then returned 4-5 days later. Last episode of spotting was yesterday. Had positive HPT 5 days ago. Endorses bilateral lower abdominal cramping since 1/22. Most recent intercourse >2 weeks ago.    OB History    Gravida  3   Para  2   Term  2   Preterm      AB      Living  2     SAB      TAB      Ectopic      Multiple  0   Live Births  2           Past Medical History:  Diagnosis Date  . Abnormal Pap smear   . Anxiety   . Asthma   . Chlamydia   . Gonorrhea   . Hypoglycemia   . Ovarian cyst     Past Surgical History:  Procedure Laterality Date  . APPENDECTOMY    . CESAREAN SECTION N/A 08/20/2017   Procedure: CESAREAN SECTION;  Surgeon: Tereso NewcomerAnyanwu, Ugonna A, MD;  Location: [facility];  Service: Obstetrics;  Laterality: N/A;  . CYSTECTOMY    . WISDOM TOOTH EXTRACTION      Family History  Problem Relation Age of Onset  . Hypertension Other   . Diabetes Other   . Other Neg Hx     Social History   Tobacco Use  . Smoking status: Former Smoker    Packs/day: 0.50    Types: Cigarettes    Last attempt to quit: 09/23/2014    Years since quitting: 3.6  . Smokeless tobacco: Never Used  Substance Use Topics  . Alcohol use: No    Alcohol/week: 0.0 standard drinks  . Drug use: No    Allergies:  Allergies  Allergen Reactions  . Latex Itching and Rash    Medications Prior to Admission  Medication Sig Dispense Refill Last Dose  . ferrous sulfate 325 (65 FE) MG tablet Take 1 tablet (325 mg total) by mouth 2 (two) times daily with a meal. (Patient not  taking: Reported on 09/03/2017) 60 tablet 0 Not Taking  . ibuprofen (ADVIL,MOTRIN) 600 MG tablet Take 1 tablet (600 mg total) by mouth every 6 (six) hours. (Patient not taking: Reported on 09/03/2017) 30 tablet 0 Not Taking  . ibuprofen (ADVIL,MOTRIN) 800 MG tablet Take 1 tablet (800 mg total) by mouth every 8 (eight) hours as needed. 30 tablet 5 Taking  . loratadine (CLARITIN) 10 MG tablet Take 1 tablet (10 mg total) by mouth daily. 30 tablet 11 Taking  . norethindrone (MICRONOR,CAMILA,ERRIN) 0.35 MG tablet Take 1 tablet (0.35 mg total) by mouth daily. 1 Package 11 Taking  . omeprazole (PRILOSEC) 20 MG capsule Take 1 capsule (20 mg total) by mouth 2 (two) times daily before a meal. (Patient not taking: Reported on 09/03/2017) 60 capsule 5 Not Taking  . oxyCODONE-acetaminophen (PERCOCET/ROXICET) 5-325 MG tablet Take 1 tablet by mouth every 6 (six) hours as needed for severe pain. 20 tablet 0   . Prenat-FeAsp-Meth-FA-DHA w/o  A (PRENATE PIXIE) 10-0.6-0.4-200 MG CAPS Take 1 tablet by mouth daily. 30 capsule 12 Taking    Review of Systems  Constitutional: Negative for chills and fever.  Respiratory: Negative for shortness of breath.   Cardiovascular: Negative for chest pain.  Gastrointestinal: Positive for abdominal pain. Negative for nausea and vomiting.  Genitourinary: Positive for vaginal bleeding. Negative for dysuria, pelvic pain and vaginal discharge.  Neurological: Negative for dizziness and light-headedness.   Physical Exam   Blood pressure 120/69, pulse 90, temperature 98.1 F (36.7 C), temperature source Oral, resp. rate 16, height 5\' 1"  (1.549 m), weight 69.4 kg, last menstrual period 04/10/2018, unknown if currently breastfeeding.  Physical Exam  Constitutional: She is oriented to person, place, and time. She appears well-developed and well-nourished. No distress.  HENT:  Head: Normocephalic and atraumatic.  Eyes: Conjunctivae and EOM are normal.  Neck: Normal range of motion. Neck  supple.  Cardiovascular: Normal rate and regular rhythm.  Respiratory: Effort normal and breath sounds normal. No respiratory distress.  GI: Soft. She exhibits no distension. There is no abdominal tenderness. There is no rebound and no guarding.  Musculoskeletal: Normal range of motion.        General: No edema.  Neurological: She is alert and oriented to person, place, and time.  Skin: Skin is warm and dry. She is not diaphoretic.  Psychiatric: She has a normal mood and affect. Her behavior is normal.    MAU Course  Procedures  MDM bhcg obtained with result of 50 k. O+ blood type so does not warrant Rhogam. Sono performed to evaluate pregnancy location.   US Ob Less Than 14 Weeks With Ob Transvaginal  Result Date: 05/18/2018 CLINICAL DATA:  Pregnant patient with vaginal bleeding. Patient is 5 weeks and 3 days pregnant based on her last menstrual period. EXAM: OBSTETRIC <14 WK [place] AND TRANSVAGINAL OB US TECHNIQUE: Both transabdominal and transvaginal ultrasound examinations were performed for complete evaluation of the gestation as well as the maternal uterus, adnexal regions, and pelvic cul-de-sac. Transvaginal technique was performed to assess early pregnancy. COMPARISON:  None. FINDINGS: Intrauterine gestational sac: Single Yolk sac:  Visualized. Embryo:  Visualized. Cardiac Activity: Visualized. Heart Rate: 120 bpm CRL:  5.4 mm   6 w   1 d                  [place] EDC: 01/08/2019 Subchorionic hemorrhage:  None visualized. Maternal uterus/adnexae: No uterine masses. Cervix is closed. Left ovarian corpus luteum measuring 2.5 cm. Ovaries and adnexa otherwise unremarkable. No abnormal pelvic free fluid. IMPRESSION: 1. Single live intrauterine pregnancy with a measured gestational age of [redacted] weeks and 1 day. No pregnancy complication. Electronically Signed   By: Amie Portland M.D.   On: 05/18/2018 10:55   IUP noted. Discussed I would recommend pelvic exam to check cervix and obtain cultures. Patient would  rather follow up with OB outpatient.   Assessment and Plan   1. Vaginal bleeding affecting early pregnancy   Discussed with patient that I would recommend follow up with OB in 48 hours to have cultures performed and to have repeat hcg. Patient voiced understanding. Strict return precautions discussed. Patient hemodynamically stable for discharge with confirmed IUP.   De Hollingshead 05/18/2018, 9:35 AM

## 2018-09-11 ENCOUNTER — Ambulatory Visit (HOSPITAL_COMMUNITY)
Admission: EM | Admit: 2018-09-11 | Discharge: 2018-09-11 | Disposition: A | Discharge status: Home / Self Care | Payer: Self-pay | Attending: Family Medicine | Admitting: Family Medicine

## 2018-09-11 ENCOUNTER — Ambulatory Visit (INDEPENDENT_AMBULATORY_CARE_PROVIDER_SITE_OTHER): Payer: Self-pay

## 2018-09-11 ENCOUNTER — Encounter (HOSPITAL_COMMUNITY): Payer: Self-pay

## 2018-09-11 ENCOUNTER — Other Ambulatory Visit: Payer: Self-pay

## 2018-09-11 DIAGNOSIS — S52592A Other fractures of lower end of left radius, initial encounter for closed fracture: Secondary | ICD-10-CM

## 2018-09-11 DIAGNOSIS — S52502A Unspecified fracture of the lower end of left radius, initial encounter for closed fracture: Secondary | ICD-10-CM

## 2018-09-11 MED ORDER — IBUPROFEN 800 MG PO TABS
800.0000 mg | ORAL_TABLET | Freq: Once | ORAL | Status: AC
  Administered 2018-09-11: 11:00:00 800 mg via ORAL

## 2018-09-11 MED ORDER — IBUPROFEN 800 MG PO TABS
ORAL_TABLET | ORAL | Status: AC
  Filled 2018-09-11: qty 1

## 2018-09-11 MED ORDER — HYDROCODONE-ACETAMINOPHEN 5-325 MG PO TABS: 1.0000 | ORAL_TABLET | Freq: Four times a day (QID) | ORAL | 0 refills | Status: DC | PRN

## 2018-09-11 NOTE — ED Notes (Signed)
Ortho tech at bedside 
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
## 2018-09-11 NOTE — Progress Notes (Signed)
Orthopedic Tech Progress Note Patient Details:  EXIE HEIKKILA [birth year] [id number]  Ortho Devices Type of Ortho Device: Arm sling, Sugartong splint Ortho Device/Splint Location: ULE Ortho Device/Splint Interventions: Adjustment, Application, Ordered   Post Interventions Patient Tolerated: Well Instructions Provided: Care of device, Adjustment of device   Donald Pore 09/11/2018, 12:08 PM

## 2018-09-11 NOTE — Discharge Instructions (Addendum)
Be aware, pain medications prescribed may cause drowsiness. Please do not drive, operate heavy machinery or make important decisions while on this medication as they may cloud your judgement.  Please rest, ice and elevate the affected extremity. If not allergic, you may take Motrin 600mg  every 8 hours or Tylenol 1000mg  every 6 hours or as needed for discomfort. Follow up with orthopaedic surgery within one week for further evaluation. Please call for any appointment. Do not remove your splint. You may use a garbage bag while showering to keep your splint dry. Please return here if you are experiencing increased pain, tingling/numbness, swelling, redness, or fever.
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
## 2019-05-11 ENCOUNTER — Encounter (HOSPITAL_COMMUNITY): Payer: Self-pay

## 2019-05-11 ENCOUNTER — Ambulatory Visit (HOSPITAL_COMMUNITY)
Admission: EM | Admit: 2019-05-11 | Discharge: 2019-05-11 | Disposition: A | Discharge status: Home / Self Care | Payer: Self-pay | Attending: Family Medicine | Admitting: Family Medicine

## 2019-05-11 ENCOUNTER — Other Ambulatory Visit: Payer: Self-pay

## 2019-05-11 DIAGNOSIS — Z20822 Contact with and (suspected) exposure to covid-19: Secondary | ICD-10-CM | POA: Insufficient documentation

## 2019-05-11 DIAGNOSIS — N390 Urinary tract infection, site not specified: Secondary | ICD-10-CM | POA: Insufficient documentation

## 2019-05-11 LAB — POCT URINALYSIS DIP (DEVICE)
Bilirubin Urine: NEGATIVE
Glucose, UA: 100 mg/dL — AB
Nitrite: POSITIVE — AB
Protein, ur: 30 mg/dL — AB
Specific Gravity, Urine: 1.02 (ref 1.005–1.030)
Urobilinogen, UA: 1 mg/dL (ref 0.0–1.0)
pH: 8.5 — ABNORMAL HIGH (ref 5.0–8.0)

## 2019-05-11 MED ORDER — CEPHALEXIN 500 MG PO CAPS: 500.0000 mg | ORAL_CAPSULE | Freq: Two times a day (BID) | ORAL | 0 refills | Status: DC

## 2019-05-11 NOTE — Discharge Instructions (Addendum)
Drink plenty of fluids Take the antibiotic 2 x a day Take 2 doses today Go home to rest Take Tylenol for pain or fever You may take over-the-counter cough and cold medicines as needed You must quarantine at home until your test result is available You can check for your test result in MyChart

## 2019-05-11 NOTE — ED Provider Notes (Signed)
[facility]    CSN: [id number] Arrival date & time: 05/11/19  1048      History   Chief Complaint Chief Complaint  Patient presents with  . Urinary Tract Infection  . COVID Exposure    HPI Brittany Williams is a 24 y.o. female.   HPI  Patient is here for a bladder infection.  She has burning with urination.  Dysuria and frequency.  Feels like she has to go the bathroom and can only go a small amount.  No abdominal pain.  No flank pain.  No fever or chills.  No vaginal discharge. While she is here she would like to be tested for Covid.  Her boyfriend has Covid.  She has no Covid symptoms  Past Medical History:  Diagnosis Date  . Abnormal Pap smear   . Anxiety   . Asthma   . Chlamydia   . Gonorrhea   . Hypoglycemia   . Ovarian cyst     Patient Active Problem List   Diagnosis Date Noted  . S/P emergency cesarean section 08/20/2017  . S/P cesarean section 08/20/2017  . Gestational diabetes mellitus (GDM), antepartum 06/01/2017    Past Surgical History:  Procedure Laterality Date  . APPENDECTOMY    . CESAREAN SECTION N/A 08/20/2017   Procedure: CESAREAN SECTION;  Surgeon: Tereso Newcomer, MD;  [facility]: [facility];  Service: Obstetrics;  Laterality: N/A;  . CYSTECTOMY    . WISDOM TOOTH EXTRACTION      OB History    Gravida  3   Para  2   Term  2   Preterm      AB      Living  2     SAB      TAB      Ectopic      Multiple  0   Live Births  2            Home Medications    Prior to Admission medications   Medication Sig Start Date End Date Taking? Authorizing Provider  cephALEXin (KEFLEX) 500 MG capsule Take 1 capsule (500 mg total) by mouth 2 (two) times daily. 05/11/19   Eustace Moore, MD  norethindrone (MICRONOR,CAMILA,ERRIN) 0.35 MG tablet Take 1 tablet (0.35 mg total) by mouth daily. 09/03/17   Brock Bad, MD  ferrous sulfate 325 (65 FE) MG tablet Take 1 tablet (325 mg total) by mouth 2 (two) times daily  with a meal. Patient not taking: Reported on 09/03/2017 08/23/17 05/11/19  Pincus Large, DO  loratadine (CLARITIN) 10 MG tablet Take 1 tablet (10 mg total) by mouth daily. 07/25/17 05/11/19  Brock Bad, MD  omeprazole (PRILOSEC) 20 MG capsule Take 1 capsule (20 mg total) by mouth 2 (two) times daily before a meal. Patient not taking: Reported on 09/03/2017 07/25/17 05/11/19  Brock Bad, MD    Family History Family History  Problem Relation Age of Onset  . Cancer Mother   . Hypertension Other   . Diabetes Other   . Other Neg Hx     Social History Social History   Tobacco Use  . Smoking status: Former Smoker    Packs/day: 0.50    Types: Cigarettes    Quit date: 09/23/2014    Years since quitting: 4.6  . Smokeless tobacco: Never Used  Substance Use Topics  . Alcohol use: No    Alcohol/week: 0.0 standard drinks  . Drug use: No  Allergies   Latex   Review of Systems Review of Systems  Constitutional: Negative for chills and fever.  HENT: Negative for congestion.   Respiratory: Negative for cough and shortness of breath.   Genitourinary: Positive for dysuria, frequency and urgency. Negative for flank pain.  Musculoskeletal: Negative for myalgias.  Neurological: Negative for headaches.     Physical Exam Triage Vital Signs ED Triage Vitals  Enc Vitals Group     BP 05/11/19 1244 125/70     Pulse Rate 05/11/19 1244 97     Resp 05/11/19 1244 16     Temp 05/11/19 1244 98.5 F (36.9 C)     Temp Source 05/11/19 1244 Oral     SpO2 05/11/19 1244 99 %     Weight --      Height --      Head Circumference --      Peak Flow --      Pain Score 05/11/19 1242 0     Pain Loc --      Pain Edu? --      Excl. in GC? --    No data found.  Updated Vital Signs BP 125/70 (BP Location: Left Arm)   Pulse 97   Temp 98.5 F (36.9 C) (Oral)   Resp 16   SpO2 99%      Physical Exam Constitutional:      General: She is not in acute distress.    Appearance: She is  well-developed.  HENT:     Head: Normocephalic and atraumatic.  Eyes:     Conjunctiva/sclera: Conjunctivae normal.     Pupils: Pupils are equal, round, and reactive to light.  Cardiovascular:     Rate and Rhythm: Normal rate.  Pulmonary:     Effort: Pulmonary effort is normal. No respiratory distress.  Musculoskeletal:        General: Normal range of motion.     Cervical back: Normal range of motion.  Skin:    General: Skin is warm and dry.  Neurological:     Mental Status: She is alert.  Psychiatric:        Mood and Affect: Mood normal.        Behavior: Behavior normal.      UC Treatments / Results  Labs (all labs ordered are listed, but only abnormal results are displayed) Labs Reviewed  POCT URINALYSIS DIP (DEVICE) - Abnormal; Notable for the following components:      Result Value   Glucose, UA 100 (*)    Ketones, ur TRACE (*)    Hgb urine dipstick TRACE (*)    pH 8.5 (*)    Protein, ur 30 (*)    Nitrite POSITIVE (*)    Leukocytes,Ua LARGE (*)    All other components within normal limits  NOVEL CORONAVIRUS, NAA (HOSP ORDER, SEND-OUT TO REF LAB; TAT 18-24 HRS)  URINE CULTURE    EKG   Radiology No results found.  Procedures Procedures (including critical care time)  Medications Ordered in UC Medications - No data to display  Initial Impression / Assessment and Plan / UC Course  I have reviewed the triage vital signs and the nursing notes.  Pertinent labs & imaging results that were available during my care of the patient were reviewed by me and considered in my medical decision making (see chart for details).  Clinical Course as of May 11 2019  Mon May 11, 2019  1317 pH(!): 8.5 [YN]    Clinical Course User Index [  YN] Raylene Everts, MD    Urine culture is done.  We will call her with her test result.  Covid testing done.  Importance of quarantine discussed Final Clinical Impressions(s) / UC Diagnoses   Final diagnoses:  Close exposure to  COVID-19 virus  Lower urinary tract infectious disease     Discharge Instructions     Drink plenty of fluids Take the antibiotic 2 x a day Take 2 doses today Go home to rest Take Tylenol for pain or fever You may take over-the-counter cough and cold medicines as needed You must quarantine at home until your test result is available You can check for your test result in MyChart     ED Prescriptions    Medication Sig Dispense Auth. Provider   cephALEXin (KEFLEX) 500 MG capsule Take 1 capsule (500 mg total) by mouth 2 (two) times daily. 10 capsule Raylene Everts, MD     PDMP not reviewed this encounter.   Raylene Everts, MD 05/11/19 2022

## 2019-05-11 NOTE — ED Triage Notes (Signed)
Patient presents to Urgent Care with complaints of frequent and burning urination since a few days ago. Patient reports she also has been exposed to her boyfriend who tested positive for covid. Pt on the phone during the entirety of triage.

## 2019-05-13 LAB — NOVEL CORONAVIRUS, NAA (HOSP ORDER, SEND-OUT TO REF LAB; TAT 18-24 HRS): SARS-CoV-2, NAA: NOT DETECTED

## 2019-05-13 LAB — URINE CULTURE
Culture: >=100000 — AB
Special Requests: NORMAL

## 2019-06-13 ENCOUNTER — Ambulatory Visit (HOSPITAL_COMMUNITY)
Admission: EM | Admit: 2019-06-13 | Discharge: 2019-06-13 | Disposition: A | Discharge status: Home / Self Care | Payer: Self-pay | Attending: Emergency Medicine | Admitting: Emergency Medicine

## 2019-06-13 ENCOUNTER — Encounter (HOSPITAL_COMMUNITY): Payer: Self-pay

## 2019-06-13 ENCOUNTER — Other Ambulatory Visit: Payer: Self-pay

## 2019-06-13 DIAGNOSIS — J020 Streptococcal pharyngitis: Secondary | ICD-10-CM

## 2019-06-13 LAB — POCT RAPID STREP A: Streptococcus, Group A Screen (Direct): POSITIVE — AB

## 2019-06-13 MED ORDER — IBUPROFEN 600 MG PO TABS: 600.0000 mg | ORAL_TABLET | Freq: Four times a day (QID) | ORAL | 0 refills | Status: DC | PRN

## 2019-06-13 MED ORDER — AMOXICILLIN-POT CLAVULANATE 875-125 MG PO TABS: 1.0000 | ORAL_TABLET | Freq: Two times a day (BID) | ORAL | 0 refills | Status: DC

## 2019-06-13 MED ORDER — DEXAMETHASONE SODIUM PHOSPHATE 10 MG/ML IJ SOLN
10.0000 mg | Freq: Once | INTRAMUSCULAR | Status: AC
  Administered 2019-06-13: 10 mg via INTRAMUSCULAR

## 2019-06-13 MED ORDER — DEXAMETHASONE SODIUM PHOSPHATE 10 MG/ML IJ SOLN
INTRAMUSCULAR | Status: AC
  Filled 2019-06-13: qty 1

## 2019-06-13 NOTE — ED Triage Notes (Addendum)
Pt present sore throat, symptoms started two days ago. Pt has white spots on the back of her throat with difficulty to swallow. Pt was recently tested for covid two weeks ago and tested negative.

## 2019-06-13 NOTE — ED Provider Notes (Signed)
HPI  SUBJECTIVE:  Patient reports sore throat starting 2 days ago. Sx worse with swallowing.  Sx better with nothing. Has been taking Flonase, Tylenol w/ o relief.  States that she feels as if her uvula is "dragging down my throat". + Fever tmax 100.1 + Swollen neck glands   No neck stiffness  No Cough + nasal congestion, rhinorrhea, postnasal drip + Myalgias + Headache No Rash  No loss of taste or smell No shortness of breath or difficulty breathing No nausea, vomiting No diarrhea No abdominal pain     No Recent Strep, mono, COVID exposure No reflux sxs No Allergy sxs  + Voice changes + Difficulty breathing at night only No , sensation of throat swelling shut No Drooling No Trismus No abx in past month.  No antipyretic in past 4-6 hrs  She has a history of asthma, GERD allergies.  Is a former smoker.  No history of diabetes hypertension frequent strep, mono. LMP: 2/5.  Denies the possibility of being pregnant. PMD: None.   Past Medical History:  Diagnosis Date  . Abnormal Pap smear   . Anxiety   . Asthma   . Chlamydia   . Gonorrhea   . Hypoglycemia   . Ovarian cyst     Past Surgical History:  Procedure Laterality Date  . APPENDECTOMY    . CESAREAN SECTION N/A 08/20/2017   Procedure: CESAREAN SECTION;  Surgeon: Osborne Oman, MD;  Location: [place];  Service: Obstetrics;  Laterality: N/A;  . CYSTECTOMY    . WISDOM TOOTH EXTRACTION      Family History  Problem Relation Age of Onset  . Cancer Mother   . Hypertension Other   . Diabetes Other   . Other Neg Hx     Social History   Tobacco Use  . Smoking status: Former Smoker    Packs/day: 0.50    Types: Cigarettes    Quit date: 09/23/2014    Years since quitting: 4.7  . Smokeless tobacco: Never Used  Substance Use Topics  . Alcohol use: No    Alcohol/week: 0.0 standard drinks  . Drug use: No     Current Facility-Administered Medications:  .  dexamethasone (DECADRON) injection 10  mg, 10 mg, Intramuscular, Once, Melynda Ripple, MD  Current Outpatient Medications:  .  amoxicillin-clavulanate (AUGMENTIN) 875-125 MG tablet, Take 1 tablet by mouth 2 (two) times daily., Disp: 20 tablet, Rfl: 0 .  ibuprofen (ADVIL) 600 MG tablet, Take 1 tablet (600 mg total) by mouth every 6 (six) hours as needed., Disp: 30 tablet, Rfl: 0 .  norethindrone (MICRONOR,CAMILA,ERRIN) 0.35 MG tablet, Take 1 tablet (0.35 mg total) by mouth daily., Disp: 1 Package, Rfl: 11  Allergies  Allergen Reactions  . Latex Itching and Rash     ROS  As noted in HPI.   Physical Exam  BP 118/79 (BP Location: Left Arm)   Pulse (!) 111   Temp 99.1 F (37.3 C) (Oral)   Resp 18   LMP 05/26/2019   SpO2 100%   Constitutional: Well developed, well nourished, no acute distress Eyes:  EOMI, conjunctiva normal bilaterally HENT: Normocephalic, atraumatic,mucus membranes moist.  - nasal congestion +erythematous oropharynx + enlarged tonsils +  exudates.  Positive petechiae on palate.  Uvula erythematous, swollen but midline.  Respiratory: Normal inspiratory effort Cardiovascular: Normal rate, no murmurs, rubs, gallops GI: nondistended, nontender. No appreciable splenomegaly skin: No rash, skin intact Lymph:+ Anterior cervical LN.  No posterior cervical lymphadenopathy Musculoskeletal: no deformities Neurologic:  Alert & oriented x 3, no focal neuro deficits Psychiatric: Speech and behavior appropriate.  ED Course   Medications  dexamethasone (DECADRON) injection 10 mg (has no administration in time range)    Orders Placed This Encounter  Procedures  . POCT rapid strep A [facility])    Standing Status:   Standing    Number of Occurrences:   1    Results for orders placed or performed during the hospital encounter of 06/13/19 (from the past 24 hour(s))  POCT rapid strep A [facility])     Status: Abnormal   Collection Time: 06/13/19 10:40 AM  Result Value Ref Range   Streptococcus,  Group A Screen (Direct) POSITIVE (A) NEGATIVE   No results found.  ED Clinical Impression  1. Strep pharyngitis      ED Assessment/Plan  Patient with extensive swelling, exudates.  Giving 10 mg of Decadron IM.  Rapid strep positive.  Deferring Covid testing as we have a reason for her sore throat and fever.  Her uvula is also swollen concerning for uvulitis, but there is no evidence of epiglottitis  retropharyngeal abscess, peritonsillar abscess so she was sent home with Augmentin for 10 days rather than given Bicillin here.  Home with ibuprofen, Tylenol, Benadryl/Maalox mixture. Patient to followup with PMD of choice when necessary, will refer to local primary care resources.  2-day work note.   Discussed labs,  MDM, plan and followup with patient. Discussed sn/sx that should prompt return to the ED. patient agrees with plan.   Meds ordered this encounter  Medications  . dexamethasone (DECADRON) injection 10 mg  . amoxicillin-clavulanate (AUGMENTIN) 875-125 MG tablet    Sig: Take 1 tablet by mouth 2 (two) times daily.    Dispense:  20 tablet    Refill:  0  . ibuprofen (ADVIL) 600 MG tablet    Sig: Take 1 tablet (600 mg total) by mouth every 6 (six) hours as needed.    Dispense:  30 tablet    Refill:  0     *This clinic note was created using Scientist, clinical (histocompatibility and immunogenetics). Therefore, there may be occasional mistakes despite careful proofreading.     Domenick Gong, MD 06/13/19 1400

## 2019-06-13 NOTE — Discharge Instructions (Addendum)
1 gram of Tylenol and 600 mg ibuprofen together 3-4 times a day as needed for pain.  Make sure you drink plenty of extra fluids.  Some people find salt water gargles and  Traditional Medicinal's "Throat Coat" tea helpful. Take 5 mL of liquid Benadryl and 5 mL of Maalox. Mix it together, and then hold it in your mouth for as long as you can and then swallow. You may do this 4 times a day.    Go to www.goodrx.com to look up your medications. This will give you a list of where you can find your prescriptions at the most affordable prices. Or ask the pharmacist what the cash price is, or if they have any other discount programs available to help make your medication more affordable. This can be less expensive than what you would pay with insurance.    Below is a list of primary care practices who are taking new patients for you to follow-up with.  [facility] internal medicine clinic Ground Floor - [facility], [address] [place], [place], [place] [address] [phone]  [facility] at [facility] [address] [place], [place] [address] [phone]  [facility] [address] [place], [place] [address] [phone]  [facility] [phone] [address] [place] [address]  [facility]: [address] [place] [address]  [phone]  [facility]: [address] [place] [address]   [phone]  [facility]: [address] [place] [address]  [phone]  [facility] : [address] [place] [address] [phone]  [facility]: [address] [place] [address] [phone]  [facility]: [address] [place] [address] [phone]  Dr.  Oneal Grout [address] [facility] [place] [address]  [phone]  Dr. Jackie Plum, [facility]. [address] [place], [place] [address]  [phone]  Go to www.goodrx.com to look up your medications. This will give you a list of where you can find your prescriptions at the most affordable prices. Or ask the pharmacist what the cash price is, or if they have any other discount programs available to help make your medication more affordable. This can be less expensive than what you would pay with insurance.

## 2019-06-23 ENCOUNTER — Ambulatory Visit (HOSPITAL_COMMUNITY)
Admission: EM | Admit: 2019-06-23 | Discharge: 2019-06-23 | Disposition: A | Discharge status: Home / Self Care | Payer: Self-pay | Attending: Physician Assistant | Admitting: Physician Assistant

## 2019-06-23 ENCOUNTER — Other Ambulatory Visit: Payer: Self-pay

## 2019-06-23 ENCOUNTER — Encounter (HOSPITAL_COMMUNITY): Payer: Self-pay

## 2019-06-23 DIAGNOSIS — B9689 Other specified bacterial agents as the cause of diseases classified elsewhere: Secondary | ICD-10-CM

## 2019-06-23 DIAGNOSIS — J020 Streptococcal pharyngitis: Secondary | ICD-10-CM

## 2019-06-23 DIAGNOSIS — J36 Peritonsillar abscess: Secondary | ICD-10-CM

## 2019-06-23 LAB — POCT INFECTIOUS MONO SCREEN: Mono Screen: NEGATIVE

## 2019-06-23 LAB — POCT RAPID STREP A: Streptococcus, Group A Screen (Direct): POSITIVE — AB

## 2019-06-23 MED ORDER — DEXAMETHASONE SODIUM PHOSPHATE 10 MG/ML IJ SOLN
10.0000 mg | Freq: Once | INTRAMUSCULAR | Status: AC
  Administered 2019-06-23: 10 mg via INTRAMUSCULAR

## 2019-06-23 MED ORDER — DEXAMETHASONE SODIUM PHOSPHATE 10 MG/ML IJ SOLN
INTRAMUSCULAR | Status: AC
  Filled 2019-06-23: qty 1

## 2019-06-23 MED ORDER — CLINDAMYCIN HCL 300 MG PO CAPS: 300.0000 mg | ORAL_CAPSULE | Freq: Three times a day (TID) | ORAL | 0 refills | Status: AC

## 2019-06-23 NOTE — ED Triage Notes (Signed)
Pt is here with a sore throat she test POSITIVE for strep on 06/13/2019 & she finished her antibiotics & her throat is still sore. She does not want COVID testing.

## 2019-06-23 NOTE — ED Provider Notes (Signed)
[place]    CSN: [id number] Arrival date & time: 06/23/19  0801      History   Chief Complaint Chief Complaint  Patient presents with  . Sore Throat    HPI Brittany Williams is a 24 y.o. female.   Patient reports to urgent care for complaint of sore throat.  She was seen and diagnosed with strep pharyngitis on 06/13/2019.  She was given dexamethasone injection and placed on Augmentin for 10 days.  She reports improvement 2 days following that treatment and has been compliant with her Augmentin.  However 2 days ago she reports her throat began to become scratchy again and has since progressed to being painful and feeling swollen.  She reports her throat feels similar to related at her initial presentation on 06/13/2019.  She denies fever and chills.  She took her last dose of Augmentin this morning prior to clinic.  She reports taking it as prescribed.  She denies any other sick contacts in her home.  Denies cough, nasal congestion, headache, nausea, vomiting, diarrhea.     Past Medical History:  Diagnosis Date  . Abnormal Pap smear   . Anxiety   . Asthma   . Chlamydia   . Gonorrhea   . Hypoglycemia   . Ovarian cyst     Patient Active Problem List   Diagnosis Date Noted  . S/P emergency cesarean section 08/20/2017  . S/P cesarean section 08/20/2017  . Gestational diabetes mellitus (GDM), antepartum 06/01/2017    Past Surgical History:  Procedure Laterality Date  . APPENDECTOMY    . CESAREAN SECTION N/A 08/20/2017   Procedure: CESAREAN SECTION;  Surgeon: Osborne Oman, MD;  Location: [facility];  Service: Obstetrics;  Laterality: N/A;  . CYSTECTOMY    . WISDOM TOOTH EXTRACTION      OB History    Gravida  3   Para  2   Term  2   Preterm      AB      Living  2     SAB      TAB      Ectopic      Multiple  0   Live Births  2            Home Medications    Prior to Admission medications   Medication Sig Start Date End Date  Taking? Authorizing Provider  amoxicillin-clavulanate (AUGMENTIN) 875-125 MG tablet Take 1 tablet by mouth 2 (two) times daily. 06/13/19   Melynda Ripple, MD  clindamycin (CLEOCIN) 300 MG capsule Take 1 capsule (300 mg total) by mouth 3 (three) times daily for 10 days. 06/23/19 07/03/19  Jersie Beel, Marguerita Beards, PA-C  ibuprofen (ADVIL) 600 MG tablet Take 1 tablet (600 mg total) by mouth every 6 (six) hours as needed. 06/13/19   Melynda Ripple, MD  norethindrone (MICRONOR,CAMILA,ERRIN) 0.35 MG tablet Take 1 tablet (0.35 mg total) by mouth daily. 09/03/17   Shelly Bombard, MD  ferrous sulfate 325 (65 FE) MG tablet Take 1 tablet (325 mg total) by mouth 2 (two) times daily with a meal. Patient not taking: Reported on 09/03/2017 08/23/17 05/11/19  Katheren Shams, DO  loratadine (CLARITIN) 10 MG tablet Take 1 tablet (10 mg total) by mouth daily. 07/25/17 05/11/19  Shelly Bombard, MD  omeprazole (PRILOSEC) 20 MG capsule Take 1 capsule (20 mg total) by mouth 2 (two) times daily before a meal. Patient not taking: Reported on 09/03/2017 07/25/17 05/11/19  Baltazar Najjar  A, MD    Family History Family History  Problem Relation Age of Onset  . Cancer Mother   . Hypertension Other   . Diabetes Other   . Other Neg Hx     Social History Social History   Tobacco Use  . Smoking status: Former Smoker    Packs/day: 0.50    Types: Cigarettes    Quit date: 09/23/2014    Years since quitting: 4.7  . Smokeless tobacco: Never Used  Substance Use Topics  . Alcohol use: No    Alcohol/week: 0.0 standard drinks  . Drug use: No     Allergies   Latex   Review of Systems Review of Systems  Constitutional: Negative for chills and fever.  HENT: Positive for sore throat. Negative for congestion, ear pain, sinus pressure, sinus pain and trouble swallowing.   Eyes: Negative for pain and visual disturbance.  Respiratory: Negative for cough and shortness of breath.   Cardiovascular: Negative for chest pain and  palpitations.  Gastrointestinal: Negative for abdominal pain, diarrhea, nausea and vomiting.  Genitourinary: Negative for dysuria and hematuria.  Musculoskeletal: Negative for arthralgias, back pain and myalgias.  Skin: Negative for color change and rash.  Neurological: Negative for seizures, syncope and headaches.  All other systems reviewed and are negative.    Physical Exam Triage Vital Signs ED Triage Vitals  Enc Vitals Group     BP      Pulse      Resp      Temp      Temp src      SpO2      Weight      Height      Head Circumference      Peak Flow      Pain Score      Pain Loc      Pain Edu?      Excl. in GC?    No data found.  Updated Vital Signs BP 121/71 (BP Location: Left Arm)   Pulse (!) 106   Temp 98.6 F (37 C) (Oral)   Resp 17   Wt 171 lb 9.6 oz (77.8 kg)   LMP 05/26/2019   SpO2 97%   Breastfeeding No   BMI 32.42 kg/m   Visual Acuity Right Eye Distance:   Left Eye Distance:   Bilateral Distance:    Right Eye Near:   Left Eye Near:    Bilateral Near:     Physical Exam Vitals and nursing note reviewed.  Constitutional:      General: She is not in acute distress.    Appearance: She is well-developed. She is not ill-appearing.  HENT:     Head: Normocephalic and atraumatic.     Mouth/Throat:     Mouth: Mucous membranes are moist. No oral lesions.     Pharynx: Oropharyngeal exudate, posterior oropharyngeal erythema and uvula swelling present.     Tonsils: Tonsillar exudate present. No tonsillar abscesses. 1+ on the right. 2+ on the left.     Comments: Uvula with swelling and slight deviation to the left.  No obvious sign of tonsillar abscess or retropharyngeal abscess. Eyes:     Conjunctiva/sclera: Conjunctivae normal.     Pupils: Pupils are equal, round, and reactive to light.  Cardiovascular:     Rate and Rhythm: Regular rhythm. Tachycardia present.     Heart sounds: No murmur.  Pulmonary:     Effort: Pulmonary effort is normal. No  respiratory distress.  Breath sounds: Normal breath sounds. No stridor.  Abdominal:     Palpations: Abdomen is soft.     Tenderness: There is no abdominal tenderness.     Comments: No organomegaly  Musculoskeletal:     Cervical back: Full passive range of motion without pain and neck supple. No edema.  Lymphadenopathy:     Cervical: Cervical adenopathy present.  Skin:    General: Skin is warm and dry.     Findings: No rash.  Neurological:     General: No focal deficit present.     Mental Status: She is alert and oriented to person, place, and time.      UC Treatments / Results  Labs (all labs ordered are listed, but only abnormal results are displayed) Labs Reviewed  POCT RAPID STREP A - Abnormal; Notable for the following components:      Result Value   Streptococcus, Group A Screen (Direct) POSITIVE (*)    All other components within normal limits  POCT INFECTIOUS MONO SCREEN  POCT INFECTIOUS MONO SCREEN    EKG   Radiology No results found.  Procedures Procedures (including critical care time)  Medications Ordered in UC Medications  dexamethasone (DECADRON) injection 10 mg (10 mg Intramuscular Given 06/23/19 0909)    Initial Impression / Assessment and Plan / UC Course  I have reviewed the triage vital signs and the nursing notes.  Pertinent labs & imaging results that were available during my care of the patient were reviewed by me and considered in my medical decision making (see chart for details).     #Strep pharyngitis #Tonsillar cellulitis Is a 24 year old female presenting with strep pharyngitis.  Resistant infection versus recurrence versus treatment failure.  No obvious sign of abscess at this time.  Dexamethasone given in clinic and started on clindamycin 3 times daily for 10 days.  ENT and primary care options for follow-up were given.  Strict emergency department precautions if worsening pain swelling or lack of improvement in 2 to 3 days were  given.  Patient verbalized understanding plan.  Final Clinical Impressions(s) / UC Diagnoses   Final diagnoses:  Strep pharyngitis  Cellulitis of tonsil     Discharge Instructions     Take the antibiotic as prescribed 3 times a day for 10 days  Continue the Tylenol 2  extra strength every 8 hours for pain.  If there is no improvement in your symptoms in 2 days or worsening swelling, noisy breathing or difficulty breathing or swallowing, I would like for you to go to the emergency department  I am also attaching information for an ear nose and throat office in [place].  Should you continue to have issues with strep pharyngitis.  Contact one of the primary care options attached to establish care.  I have also placed a primary care assistance request and [facility] may reach out to you.     ED Prescriptions    Medication Sig Dispense Auth. Provider   clindamycin (CLEOCIN) 300 MG capsule Take 1 capsule (300 mg total) by mouth 3 (three) times daily for 10 days. 30 capsule Arilyn Brierley, Veryl Speak, PA-C     PDMP not reviewed this encounter.   Hermelinda Medicus, PA-C 06/23/19 1549

## 2019-06-23 NOTE — Discharge Instructions (Addendum)
Take the antibiotic as prescribed 3 times a day for 10 days  Continue the Tylenol 2  extra strength every 8 hours for pain.  If there is no improvement in your symptoms in 2 days or worsening swelling, noisy breathing or difficulty breathing or swallowing, I would like for you to go to the emergency department  I am also attaching information for an ear nose and throat office in [place].  Should you continue to have issues with strep pharyngitis.  Contact one of the primary care options attached to establish care.  I have also placed a primary care assistance request and [place] may reach out to you.

## 2020-01-14 ENCOUNTER — Other Ambulatory Visit: Payer: Self-pay

## 2020-01-14 ENCOUNTER — Ambulatory Visit (HOSPITAL_COMMUNITY)
Admission: EM | Admit: 2020-01-14 | Discharge: 2020-01-14 | Disposition: A | Discharge status: Home / Self Care | Payer: Self-pay | Attending: Family Medicine | Admitting: Family Medicine

## 2020-01-14 ENCOUNTER — Encounter (HOSPITAL_COMMUNITY): Payer: Self-pay | Admitting: *Deleted

## 2020-01-14 DIAGNOSIS — M79641 Pain in right hand: Secondary | ICD-10-CM

## 2020-01-14 DIAGNOSIS — M779 Enthesopathy, unspecified: Secondary | ICD-10-CM

## 2020-01-14 MED ORDER — CYCLOBENZAPRINE HCL 5 MG PO TABS: 5.0000 mg | ORAL_TABLET | Freq: Three times a day (TID) | ORAL | 0 refills | Status: DC | PRN

## 2020-01-14 MED ORDER — PREDNISONE 10 MG PO TABS: ORAL_TABLET | ORAL | 0 refills | Status: DC

## 2020-01-14 NOTE — ED Triage Notes (Signed)
Patient states that on yesterday she was braiding her daughter's hair when she popped her pointer finger on the right hand and a sharp pain went up to elbow. Patient states since that time she has not been able to drive or change gears. Patient is able to open and close hand but experiences pain. Patient denies numbness or tingling.

## 2020-01-14 NOTE — ED Provider Notes (Signed)
[facility]    CSN: [id number] Arrival date & time: 01/14/20  0801      History   Chief Complaint Chief Complaint  Patient presents with  . Elbow Pain    HPI Brittany Williams is a 24 y.o. female.   Patient presenting today with 1 day of right index finger pain that radiates up into elbow. Started after she was braiding her daughters hair - states she was feeling stiff in fingers and tried to pop the finger, instantly felt pain shoot up toward elbow and noticed swelling. Movement is very painful and difficult at this point, including twisting the forearm or trying to lift anything. Denies redness, numbness, tingling, diffuse swelling beyond index finger. Has not yet tried anything OTC for relief.      Past Medical History:  Diagnosis Date  . Abnormal Pap smear   . Anxiety   . Asthma   . Chlamydia   . Gonorrhea   . Hypoglycemia   . Ovarian cyst     Patient Active Problem List   Diagnosis Date Noted  . S/P emergency cesarean section 08/20/2017  . S/P cesarean section 08/20/2017  . Gestational diabetes mellitus (GDM), antepartum 06/01/2017    Past Surgical History:  Procedure Laterality Date  . APPENDECTOMY    . CESAREAN SECTION N/A 08/20/2017   Procedure: CESAREAN SECTION;  Surgeon: Tereso Newcomer, MD;  Location: [facility];  Service: Obstetrics;  Laterality: N/A;  . CYSTECTOMY    . WISDOM TOOTH EXTRACTION      OB History    Gravida  3   Para  2   Term  2   Preterm      AB      Living  2     SAB      TAB      Ectopic      Multiple  0   Live Births  2            Home Medications    Prior to Admission medications   Medication Sig Start Date End Date Taking? Authorizing Provider  amoxicillin-clavulanate (AUGMENTIN) 875-125 MG tablet Take 1 tablet by mouth 2 (two) times daily. 06/13/19   Domenick Gong, MD  cyclobenzaprine (FLEXERIL) 5 MG tablet Take 1 tablet (5 mg total) by mouth 3 (three) times daily as needed for  muscle spasms. DO NOT DRINK ALCOHOL OR DRIVE WHILE TAKING THIS MEDICATION 01/14/20   Particia Nearing, PA-C  ibuprofen (ADVIL) 600 MG tablet Take 1 tablet (600 mg total) by mouth every 6 (six) hours as needed. 06/13/19   Domenick Gong, MD  norethindrone (MICRONOR,CAMILA,ERRIN) 0.35 MG tablet Take 1 tablet (0.35 mg total) by mouth daily. 09/03/17   Brock Bad, MD  predniSONE (DELTASONE) 10 MG tablet Take 6 tabs day one, 5 tabs day two, 4 tabs day three, etc 01/14/20   Particia Nearing, PA-C  ferrous sulfate 325 (65 FE) MG tablet Take 1 tablet (325 mg total) by mouth 2 (two) times daily with a meal. Patient not taking: Reported on 09/03/2017 08/23/17 05/11/19  Pincus Large, DO  loratadine (CLARITIN) 10 MG tablet Take 1 tablet (10 mg total) by mouth daily. 07/25/17 05/11/19  Brock Bad, MD  omeprazole (PRILOSEC) 20 MG capsule Take 1 capsule (20 mg total) by mouth 2 (two) times daily before a meal. Patient not taking: Reported on 09/03/2017 07/25/17 05/11/19  Brock Bad, MD    Family History Family History  Problem  Relation Age of Onset  . Cancer Mother   . Hypertension Other   . Diabetes Other   . Other Neg Hx     Social History Social History   Tobacco Use  . Smoking status: Former Smoker    Packs/day: 0.50    Types: Cigarettes    Quit date: 09/23/2014    Years since quitting: 5.3  . Smokeless tobacco: Never Used  Vaping Use  . Vaping Use: Never used  Substance Use Topics  . Alcohol use: No    Alcohol/week: 0.0 standard drinks  . Drug use: No     Allergies   Latex   Review of Systems Review of Systems PER HPI    Physical Exam Triage Vital Signs ED Triage Vitals  Enc Vitals Group     BP 01/14/20 0838 109/73     Pulse Rate 01/14/20 0838 83     Resp 01/14/20 0838 16     Temp 01/14/20 0838 98.5 F (36.9 C)     Temp Source 01/14/20 0838 Oral     SpO2 01/14/20 0838 98 %     Weight --      Height --      Head Circumference --      Peak  Flow --      Pain Score 01/14/20 0836 8     Pain Loc --      Pain Edu? --      Excl. in GC? --    No data found.  Updated Vital Signs BP 109/73 (BP Location: Right Arm)   Pulse 83   Temp 98.5 F (36.9 C) (Oral)   Resp 16   LMP 12/31/2019   SpO2 98%   Visual Acuity Right Eye Distance:   Left Eye Distance:   Bilateral Distance:    Right Eye Near:   Left Eye Near:    Bilateral Near:     Physical Exam Vitals and nursing note reviewed.  Constitutional:      Appearance: Normal appearance. She is not ill-appearing.  HENT:     Head: Atraumatic.  Eyes:     Extraocular Movements: Extraocular movements intact.     Conjunctiva/sclera: Conjunctivae normal.  Cardiovascular:     Rate and Rhythm: Normal rate and regular rhythm.     Pulses: Normal pulses.     Heart sounds: Normal heart sounds.     Comments: Capillary refill less than 2 seconds right UE Pulmonary:     Effort: Pulmonary effort is normal.     Breath sounds: Normal breath sounds.  Musculoskeletal:        General: Swelling (mild localized edema at base of right index finger) and tenderness (significant ttp at index finger down into carpal and also up into forearm) present. No deformity. Normal range of motion.     Cervical back: Normal range of motion and neck supple.     Comments: Full ROM intact, but exam limited due to her pain with motion  Skin:    General: Skin is warm and dry.  Neurological:     Mental Status: She is alert and oriented to person, place, and time.     Comments: Right UE neurovascularly intact  Psychiatric:        Mood and Affect: Mood normal.        Thought Content: Thought content normal.        Judgment: Judgment normal.    UC Treatments / Results  Labs (all labs ordered are listed, but only abnormal  results are displayed) Labs Reviewed - No data to display  EKG   Radiology No results found.  Procedures Procedures (including critical care time)  Medications Ordered in  UC Medications - No data to display  Initial Impression / Assessment and Plan / UC Course  I have reviewed the triage vital signs and the nursing notes.  Pertinent labs & imaging results that were available during my care of the patient were reviewed by me and considered in my medical decision making (see chart for details).     No obvious bony injury, and she declines x-ray. Suspect tendonitis - will tx with prednisone, flexeril prn, epsom salt soaks, massage, stretches. Work note given.    Final Clinical Impressions(s) / UC Diagnoses   Final diagnoses:  Right hand pain  Tendonitis     Discharge Instructions     You can try compression sleeves, warm epsom salt soaks, light stretching and exercises, kinesio tape    ED Prescriptions    Medication Sig Dispense Auth. Provider   predniSONE (DELTASONE) 10 MG tablet Take 6 tabs day one, 5 tabs day two, 4 tabs day three, etc 21 tablet Particia Nearing, PA-C   cyclobenzaprine (FLEXERIL) 5 MG tablet Take 1 tablet (5 mg total) by mouth 3 (three) times daily as needed for muscle spasms. DO NOT DRINK ALCOHOL OR DRIVE WHILE TAKING THIS MEDICATION 30 tablet Particia Nearing, [place]     PDMP not reviewed this encounter.   Particia Nearing, [place] 01/14/20 1529

## 2020-01-14 NOTE — Discharge Instructions (Signed)
You can try compression sleeves, warm epsom salt soaks, light stretching and exercises, kinesio tape

## 2020-05-09 ENCOUNTER — Ambulatory Visit (HOSPITAL_COMMUNITY)
Admission: EM | Admit: 2020-05-09 | Discharge: 2020-05-09 | Disposition: A | Discharge status: Home / Self Care | Payer: Self-pay | Attending: Student | Admitting: Student

## 2020-05-09 ENCOUNTER — Other Ambulatory Visit: Payer: Self-pay

## 2020-05-09 ENCOUNTER — Encounter (HOSPITAL_COMMUNITY): Payer: Self-pay

## 2020-05-09 DIAGNOSIS — L0501 Pilonidal cyst with abscess: Secondary | ICD-10-CM

## 2020-05-09 DIAGNOSIS — Z5189 Encounter for other specified aftercare: Secondary | ICD-10-CM

## 2020-05-09 MED ORDER — SULFAMETHOXAZOLE-TRIMETHOPRIM 800-160 MG PO TABS: 1.0000 | ORAL_TABLET | Freq: Two times a day (BID) | ORAL | 0 refills | Status: AC

## 2020-05-09 NOTE — Discharge Instructions (Signed)
-  Take the antibiotic twice daily for 7 days- Bactrim (sulfamethoxazole-trimethoprim)  -Continue to use warm compresses 1-2x daily  -Seek additional medical attention if you develop fevers/chills, worsening of the abscess despite treatment, etc

## 2020-05-09 NOTE — ED Triage Notes (Signed)
Patient states she has had a previous issue several years ago with a similar wound and this one flared up about a week ago and has been painful since. Pt is aox4 and ambulatory.

## 2020-05-09 NOTE — ED Provider Notes (Signed)
[facility]    CSN: [id number] Arrival date & time: 05/09/20  0801      History   Chief Complaint Chief Complaint  Patient presents with  . Wound Check    Right buttock x 1 week    HPI Brittany Williams is a 25 y.o. female presenting for wound check- sore between buttocks. History asthma, ovarian cyst, appendectomy, anxiety, chlamydia, gonorrhea. Presenting today for wound check. States she first noticed pain and "boil" between her buttocks 1 week ago. Since then it got larger and more painful. She did a warm compress last night that resulted in a significant amount of spontaneous drainage. Today presenting for wound check. States the area is significantly less painful today following drainage. Denies fevers/chills. States she had a similar abscess 1 year ago that resolved on its own.   HPI  Past Medical History:  Diagnosis Date  . Abnormal Pap smear   . Anxiety   . Asthma   . Chlamydia   . Gonorrhea   . Hypoglycemia   . Ovarian cyst     Patient Active Problem List   Diagnosis Date Noted  . S/P emergency cesarean section 08/20/2017  . S/P cesarean section 08/20/2017  . Gestational diabetes mellitus (GDM), antepartum 06/01/2017    Past Surgical History:  Procedure Laterality Date  . APPENDECTOMY    . CESAREAN SECTION N/A 08/20/2017   Procedure: CESAREAN SECTION;  Surgeon: Tereso Newcomer, MD;  Location: [facility];  Service: Obstetrics;  Laterality: N/A;  . CYSTECTOMY    . WISDOM TOOTH EXTRACTION      OB History    Gravida  3   Para  2   Term  2   Preterm      AB      Living  2     SAB      IAB      Ectopic      Multiple  0   Live Births  2            Home Medications    Prior to Admission medications   Medication Sig Start Date End Date Taking? Authorizing Provider  ibuprofen (ADVIL) 600 MG tablet Take 1 tablet (600 mg total) by mouth every 6 (six) hours as needed. 06/13/19  Yes Domenick Gong, MD   sulfamethoxazole-trimethoprim (BACTRIM DS) 800-160 MG tablet Take 1 tablet by mouth 2 (two) times daily for 7 days. 05/09/20 05/16/20 Yes Rhys Martini, PA-C  ferrous sulfate 325 (65 FE) MG tablet Take 1 tablet (325 mg total) by mouth 2 (two) times daily with a meal. Patient not taking: Reported on 09/03/2017 08/23/17 05/11/19  Pincus Large, DO  loratadine (CLARITIN) 10 MG tablet Take 1 tablet (10 mg total) by mouth daily. 07/25/17 05/11/19  Brock Bad, MD  norethindrone (MICRONOR,CAMILA,ERRIN) 0.35 MG tablet Take 1 tablet (0.35 mg total) by mouth daily. 09/03/17 05/09/20  Brock Bad, MD  omeprazole (PRILOSEC) 20 MG capsule Take 1 capsule (20 mg total) by mouth 2 (two) times daily before a meal. Patient not taking: Reported on 09/03/2017 07/25/17 05/11/19  Brock Bad, MD    Family History Family History  Problem Relation Age of Onset  . Cancer Mother   . Hypertension Other   . Diabetes Other   . Other Neg Hx     Social History Social History   Tobacco Use  . Smoking status: Former Smoker    Packs/day: 0.50    Types: Cigarettes  Quit date: 09/23/2014    Years since quitting: 5.6  . Smokeless tobacco: Never Used  Vaping Use  . Vaping Use: Never used  Substance Use Topics  . Alcohol use: No    Alcohol/week: 0.0 standard drinks  . Drug use: No     Allergies   Latex   Review of Systems Review of Systems  Skin: Positive for wound.  All other systems reviewed and are negative.    Physical Exam Triage Vital Signs ED Triage Vitals  Enc Vitals Group     BP      Pulse      Resp      Temp      Temp src      SpO2      Weight      Height      Head Circumference      Peak Flow      Pain Score      Pain Loc      Pain Edu?      Excl. in GC?    No data found.  Updated Vital Signs BP 117/61 (BP Location: Right Arm)   Pulse 80   Temp 98.2 F (36.8 C) (Oral)   Resp 17   LMP 04/23/2020 (Exact Date)   SpO2 100%   Visual Acuity Right Eye  Distance:   Left Eye Distance:   Bilateral Distance:    Right Eye Near:   Left Eye Near:    Bilateral Near:     Physical Exam Vitals reviewed.  Constitutional:      Appearance: Normal appearance.  Cardiovascular:     Rate and Rhythm: Normal rate and regular rhythm.     Heart sounds: Normal heart sounds.  Pulmonary:     Effort: Pulmonary effort is normal.     Breath sounds: Normal breath sounds.  Skin:    Comments: Gluteal cleft: mild tenderness to palpation, mildly erythematous. Two areas of spontaneous drainage (no current drainage). No fluctuance, not indurated.   Neurological:     General: No focal deficit present.     Mental Status: She is alert and oriented to person, place, and time.  Psychiatric:        Mood and Affect: Mood normal.        Behavior: Behavior normal.        Thought Content: Thought content normal.        Judgment: Judgment normal.      UC Treatments / Results  Labs (all labs ordered are listed, but only abnormal results are displayed) Labs Reviewed - No data to display  EKG   Radiology No results found.  Procedures Procedures (including critical care time)  Medications Ordered in UC Medications - No data to display  Initial Impression / Assessment and Plan / UC Course  I have reviewed the triage vital signs and the nursing notes.  Pertinent labs & imaging results that were available during my care of the patient were reviewed by me and considered in my medical decision making (see chart for details).      afebrile nontachycardic nontachypneic, oxygenating well on room air.  Wound was cleaned. Wound care instructions provided.  Bactrim as below.  Seek additional medical attention if you develop fevers/chills, worsening of the abscess despite treatment, etc  Final Clinical Impressions(s) / UC Diagnoses   Final diagnoses:  Visit for wound check  Pilonidal abscess     Discharge Instructions     -Take the antibiotic twice daily  for 7 days- Bactrim (sulfamethoxazole-trimethoprim)  -Continue to use warm compresses 1-2x daily  -Seek additional medical attention if you develop fevers/chills, worsening of the abscess despite treatment, etc    ED Prescriptions    Medication Sig Dispense Auth. Provider   sulfamethoxazole-trimethoprim (BACTRIM DS) 800-160 MG tablet Take 1 tablet by mouth 2 (two) times daily for 7 days. 14 tablet Rhys Martini, PA-C     PDMP not reviewed this encounter.   Pieper, [place], PA-C 05/09/20 1102

## 2020-11-28 ENCOUNTER — Ambulatory Visit: Payer: Self-pay | Attending: Internal Medicine

## 2020-11-28 ENCOUNTER — Other Ambulatory Visit (HOSPITAL_BASED_OUTPATIENT_CLINIC_OR_DEPARTMENT_OTHER): Payer: Self-pay

## 2020-11-28 ENCOUNTER — Other Ambulatory Visit: Payer: Self-pay

## 2020-11-28 DIAGNOSIS — Z23 Encounter for immunization: Secondary | ICD-10-CM

## 2020-11-28 MED ORDER — PFIZER-BIONT COVID-19 VAC-TRIS 30 MCG/0.3ML IM SUSP
INTRAMUSCULAR | 0 refills | Status: DC
  Filled 2020-11-28: qty 0.3, 1d supply, fill #0

## 2020-11-28 NOTE — Progress Notes (Signed)
   Covid-19 Vaccination Clinic  Name:  IMELDA DANDRIDGE    MRN: [id number] DOB: May 15, 1995  11/28/2020  Ms. Monica was observed post Covid-19 immunization for 15 minutes without incident. She was provided with Vaccine Information Sheet and instruction to access the V-Safe system.   Ms. Tullos was instructed to call 911 with any severe reactions post vaccine: Difficulty breathing  Swelling of face and throat  A fast heartbeat  A bad rash all over body  Dizziness and weakness   Immunizations Administered     Name Date Dose VIS Date Route   PFIZER Comrnaty(Gray TOP) Covid-19 Vaccine 11/28/2020  2:17 PM 0.3 mL 03/24/2020 Intramuscular   Manufacturer: [employer], [employer]   Lot: [id number]   NDC: [phone]

## 2021-01-23 ENCOUNTER — Other Ambulatory Visit: Payer: Self-pay

## 2021-01-23 ENCOUNTER — Ambulatory Visit
Admission: EM | Admit: 2021-01-23 | Discharge: 2021-01-23 | Disposition: A | Discharge status: Home / Self Care | Payer: Self-pay

## 2021-01-23 NOTE — ED Notes (Signed)
Per patient access, patient LWBS

## 2021-01-24 ENCOUNTER — Ambulatory Visit
Admission: RE | Admit: 2021-01-24 | Discharge: 2021-01-24 | Disposition: A | Discharge status: Home / Self Care | Payer: Self-pay | Source: Ambulatory Visit | Attending: Emergency Medicine | Admitting: Emergency Medicine

## 2021-01-24 VITALS — BP 110/69 | HR 94 | Temp 99.6°F | Resp 16

## 2021-01-24 DIAGNOSIS — Z3490 Encounter for supervision of normal pregnancy, unspecified, unspecified trimester: Secondary | ICD-10-CM

## 2021-01-24 DIAGNOSIS — Z3201 Encounter for pregnancy test, result positive: Secondary | ICD-10-CM

## 2021-01-24 LAB — POCT URINE PREGNANCY: Preg Test, Ur: POSITIVE — AB

## 2021-01-24 NOTE — Discharge Instructions (Signed)
Your pregnancy test today is positive.  Please begin a daily prenatal vitamin.

## 2021-01-24 NOTE — ED Triage Notes (Signed)
Pt states she had positive UPT at home Saturday. She needs to have a positive test from [place] for school and work.

## 2021-01-24 NOTE — ED Provider Notes (Signed)
[facility]    CSN: [id number] Arrival date & time: 01/24/21  1032      History   Chief Complaint Chief Complaint  Patient presents with   Possible Pregnancy    HPI Brittany Williams is a 25 y.o. female.   Patient reports positive home pregnancy test.  Patient states she is in school and needs a note.  Urine pregnancy test performed here in office today is positive.  She reports no other concerns.  The history is provided by the patient.   Past Medical History:  Diagnosis Date   Abnormal Pap smear    Anxiety    Asthma    Chlamydia    Gonorrhea    Hypoglycemia    Ovarian cyst     Patient Active Problem List   Diagnosis Date Noted   S/P emergency cesarean section 08/20/2017   S/P cesarean section 08/20/2017   Gestational diabetes mellitus (GDM), antepartum 06/01/2017    Past Surgical History:  Procedure Laterality Date   APPENDECTOMY     CESAREAN SECTION N/A 08/20/2017   Procedure: CESAREAN SECTION;  Surgeon: Tereso Newcomer, MD;  [facility];  Service: Obstetrics;  Laterality: N/A;   CYSTECTOMY     WISDOM TOOTH EXTRACTION      OB History     Gravida  3   Para  2   Term  2   Preterm      AB      Living  2      SAB      IAB      Ectopic      Multiple  0   Live Births  2            Home Medications    Prior to Admission medications   Medication Sig Start Date End Date Taking? Authorizing Provider  ferrous sulfate 325 (65 FE) MG tablet Take 1 tablet (325 mg total) by mouth 2 (two) times daily with a meal. Patient not taking: Reported on 09/03/2017 08/23/17 05/11/19  Pincus Large, DO  loratadine (CLARITIN) 10 MG tablet Take 1 tablet (10 mg total) by mouth daily. 07/25/17 05/11/19  Brock Bad, MD  norethindrone (MICRONOR,CAMILA,ERRIN) 0.35 MG tablet Take 1 tablet (0.35 mg total) by mouth daily. 09/03/17 05/09/20  Brock Bad, MD  omeprazole (PRILOSEC) 20 MG capsule Take 1 capsule (20 mg total) by  mouth 2 (two) times daily before a meal. Patient not taking: Reported on 09/03/2017 07/25/17 05/11/19  Brock Bad, MD    Family History Family History  Problem Relation Age of Onset   Cancer Mother    Hypertension Other    Diabetes Other    Other Neg Hx     Social History Social History   Tobacco Use   Smoking status: Former    Packs/day: 0.50    Types: Cigarettes    Quit date: 09/23/2014    Years since quitting: 6.3   Smokeless tobacco: Never  Vaping Use   Vaping Use: Never used  Substance Use Topics   Alcohol use: No    Alcohol/week: 0.0 standard drinks   Drug use: No     Allergies   Latex   Review of Systems Review of Systems Pertinent findings noted in history of present illness.    Physical Exam Triage Vital Signs ED Triage Vitals  Enc Vitals Group     BP 01/24/21 1043 110/69     Pulse Rate 01/24/21 1043 94  Resp 01/24/21 1043 16     Temp 01/24/21 1043 99.6 F (37.6 C)     Temp Source 01/24/21 1043 Oral     SpO2 01/24/21 1043 98 %     Weight --      Height --      Head Circumference --      Peak Flow --      Pain Score 01/24/21 1041 0     Pain Loc --      Pain Edu? --      Excl. in GC? --    No data found.  Updated Vital Signs BP 110/69 (BP Location: Right Arm)   Pulse 94   Temp 99.6 F (37.6 C) (Oral)   Resp 16   LMP 12/20/2020 (Exact Date)   SpO2 98%   Visual Acuity Right Eye Distance:   Left Eye Distance:   Bilateral Distance:    Right Eye Near:   Left Eye Near:    Bilateral Near:     Physical Exam Vitals and nursing note reviewed.  Constitutional:      Appearance: Normal appearance.  HENT:     Head: Normocephalic and atraumatic.  Eyes:     Conjunctiva/sclera: Conjunctivae normal.  Cardiovascular:     Rate and Rhythm: Normal rate and regular rhythm.     Pulses: Normal pulses.     Heart sounds: Normal heart sounds.  Pulmonary:     Effort: Pulmonary effort is normal.     Breath sounds: Normal breath sounds.   Abdominal:     General: Abdomen is flat. Bowel sounds are normal.     Palpations: Abdomen is soft.  Musculoskeletal:        General: Normal range of motion.     Cervical back: Normal range of motion and neck supple.  Skin:    General: Skin is warm and dry.  Neurological:     General: No focal deficit present.     Mental Status: She is alert and oriented to person, place, and time. Mental status is at baseline.  Psychiatric:        Mood and Affect: Mood normal.        Behavior: Behavior normal.     UC Treatments / Results  Labs (all labs ordered are listed, but only abnormal results are displayed) Labs Reviewed  POCT URINE PREGNANCY - Abnormal; Notable for the following components:      Result Value   Preg Test, Ur Positive (*)    All other components within normal limits    EKG   Radiology No results found.  Procedures Procedures (including critical care time)  Medications Ordered in UC Medications - No data to display  Initial Impression / Assessment and Plan / UC Course  I have reviewed the triage vital signs and the nursing notes.  Pertinent labs & imaging results that were available during my care of the patient were reviewed by me and considered in my medical decision making (see chart for details).     Patient provided with note as requested.   All questions were addressed during visit.  Please see discharge instructions below for further details of plan.  Final Clinical Impressions(s) / UC Diagnoses   Final diagnoses:  Pregnancy, unspecified gestational age     Discharge Instructions      Your pregnancy test today is positive.  Please begin a daily prenatal vitamin.     ED Prescriptions   None    PDMP not reviewed this encounter.  Theadora Rama Scales, PA-C 01/24/21 1230

## 2021-02-15 ENCOUNTER — Ambulatory Visit (INDEPENDENT_AMBULATORY_CARE_PROVIDER_SITE_OTHER): Payer: Self-pay

## 2021-02-15 ENCOUNTER — Other Ambulatory Visit: Payer: Self-pay

## 2021-02-15 VITALS — BP 125/83 | HR 78 | Ht 61.0 in | Wt 176.5 lb

## 2021-02-15 DIAGNOSIS — Z3480 Encounter for supervision of other normal pregnancy, unspecified trimester: Secondary | ICD-10-CM

## 2021-02-15 DIAGNOSIS — Z3A08 8 weeks gestation of pregnancy: Secondary | ICD-10-CM

## 2021-02-15 DIAGNOSIS — Z3481 Encounter for supervision of other normal pregnancy, first trimester: Secondary | ICD-10-CM

## 2021-02-15 DIAGNOSIS — O3680X0 Pregnancy with inconclusive fetal viability, not applicable or unspecified: Secondary | ICD-10-CM

## 2021-02-15 NOTE — Progress Notes (Signed)
New OB Intake  I connected with  LAWANA HARTZELL on 02/15/21 at  3:15 PM EDT by in person  and verified that I am speaking with the correct person using two identifiers. Nurse is located at [facility] and pt is located at [place].  I discussed the limitations, risks, security and privacy concerns of performing an evaluation and management service by telephone and the availability of in person appointments. I also discussed with the patient that there may be a patient responsible charge related to this service. The patient expressed understanding and agreed to proceed.  I explained I am completing New OB Intake today. We discussed her EDD of 09/27/21 that is based on LMP of 12/21/20. Pt is G3/P2002. I reviewed her allergies, medications, Medical/Surgical/OB history, and appropriate screenings. I informed her of [facility] services. Based on history, this is a/an  pregnancy uncomplicated .   Patient Active Problem List   Diagnosis Date Noted   Supervision of other normal pregnancy, antepartum 02/15/2021   S/P emergency cesarean section 08/20/2017   S/P cesarean section 08/20/2017   Gestational diabetes mellitus (GDM), antepartum 06/01/2017    Concerns addressed today  Delivery Plans:  Plans to deliver at [facility] [address].   MyChart/Babyscripts MyChart access verified. I explained pt will have some visits in office and some virtually. Babyscripts instructions given and order placed. Patient verifies receipt of registration text/e-mail. Account successfully created and app downloaded.  Blood Pressure Cuff  Patient has access to BP cuff at home.   Weight scale: Patient has access to weight scale.  Anatomy US Explained first scheduled [place] will be around 19 weeks. Dating and viability scan performed today.  Labs Discussed Avelina Laine genetic screening with patient. Would like both Panorama and Horizon drawn at new OB visit. Routine prenatal labs needed.  Covid Vaccine Patient has covid vaccine.   Mother/ Baby  Dyad Candidate?    If yes, offer as possibility  Informed patient of [facility] website  and placed link in her AVS.   Social Determinants of Health Food Insecurity: Patient denies food insecurity. WIC Referral: Patient is not interested in referral to [facility].  Transportation: Patient denies transportation needs. Childcare: Discussed no children allowed at ultrasound appointments. Offered childcare services; patient declines childcare services at this time.  Send link to Pregnancy Navigators   Placed OB Box on problem list and updated  First visit review I reviewed new OB appt with pt. I explained she will have a pelvic exam, ob bloodwork with genetic screening, and PAP smear. Explained pt will be seen by Jaynie Collins at first visit; encounter routed to appropriate provider. Explained that patient will be seen by pregnancy navigator following visit with provider. [facility] information placed in AVS.   Hamilton Capri, RN 02/15/2021  3:24 PM

## 2021-03-01 ENCOUNTER — Other Ambulatory Visit: Payer: Self-pay

## 2021-03-01 ENCOUNTER — Other Ambulatory Visit (HOSPITAL_COMMUNITY)
Admission: RE | Admit: 2021-03-01 | Discharge: 2021-03-01 | Disposition: A | Discharge status: Home / Self Care | Payer: Medicaid Other | Source: Ambulatory Visit | Attending: Obstetrics & Gynecology | Admitting: Obstetrics & Gynecology

## 2021-03-01 ENCOUNTER — Inpatient Hospital Stay (HOSPITAL_COMMUNITY)
Admission: EM | Admit: 2021-03-01 | Discharge: 2021-03-02 | Disposition: A | Discharge status: Home / Self Care | Payer: Medicaid Other | Attending: Obstetrics & Gynecology | Admitting: Obstetrics & Gynecology

## 2021-03-01 ENCOUNTER — Ambulatory Visit (INDEPENDENT_AMBULATORY_CARE_PROVIDER_SITE_OTHER): Payer: Self-pay | Admitting: Obstetrics & Gynecology

## 2021-03-01 ENCOUNTER — Encounter: Payer: Self-pay | Admitting: Obstetrics & Gynecology

## 2021-03-01 VITALS — BP 112/70 | HR 99 | Wt 176.0 lb

## 2021-03-01 DIAGNOSIS — Z3A10 10 weeks gestation of pregnancy: Secondary | ICD-10-CM | POA: Insufficient documentation

## 2021-03-01 DIAGNOSIS — O26891 Other specified pregnancy related conditions, first trimester: Secondary | ICD-10-CM

## 2021-03-01 DIAGNOSIS — N760 Acute vaginitis: Secondary | ICD-10-CM

## 2021-03-01 DIAGNOSIS — O09299 Supervision of pregnancy with other poor reproductive or obstetric history, unspecified trimester: Secondary | ICD-10-CM

## 2021-03-01 DIAGNOSIS — B9689 Other specified bacterial agents as the cause of diseases classified elsewhere: Secondary | ICD-10-CM

## 2021-03-01 DIAGNOSIS — R102 Pelvic and perineal pain: Secondary | ICD-10-CM | POA: Insufficient documentation

## 2021-03-01 DIAGNOSIS — O34219 Maternal care for unspecified type scar from previous cesarean delivery: Secondary | ICD-10-CM

## 2021-03-01 DIAGNOSIS — O09291 Supervision of pregnancy with other poor reproductive or obstetric history, first trimester: Secondary | ICD-10-CM

## 2021-03-01 DIAGNOSIS — Z23 Encounter for immunization: Secondary | ICD-10-CM

## 2021-03-01 DIAGNOSIS — Z3480 Encounter for supervision of other normal pregnancy, unspecified trimester: Secondary | ICD-10-CM | POA: Diagnosis not present

## 2021-03-01 DIAGNOSIS — O23591 Infection of other part of genital tract in pregnancy, first trimester: Secondary | ICD-10-CM

## 2021-03-01 DIAGNOSIS — N888 Other specified noninflammatory disorders of cervix uteri: Secondary | ICD-10-CM

## 2021-03-01 DIAGNOSIS — N898 Other specified noninflammatory disorders of vagina: Secondary | ICD-10-CM

## 2021-03-01 DIAGNOSIS — O99210 Obesity complicating pregnancy, unspecified trimester: Secondary | ICD-10-CM

## 2021-03-01 DIAGNOSIS — O99211 Obesity complicating pregnancy, first trimester: Secondary | ICD-10-CM

## 2021-03-01 DIAGNOSIS — O209 Hemorrhage in early pregnancy, unspecified: Secondary | ICD-10-CM | POA: Insufficient documentation

## 2021-03-01 DIAGNOSIS — Z8632 Personal history of gestational diabetes: Secondary | ICD-10-CM

## 2021-03-01 MED ORDER — ASPIRIN EC 81 MG PO TBEC: 81.0000 mg | DELAYED_RELEASE_TABLET | Freq: Every day | ORAL | 2 refills | Status: DC

## 2021-03-01 NOTE — ED Provider Notes (Signed)
Emergency Medicine Provider OB Triage Evaluation Note  Brittany Williams is a 25 y.o. female, G3P2002, at [redacted]w[redacted]d gestation who presents to the emergency department with complaints of vaginal bleeding.  Had  vaginal swabs done earlier today in clinic due to discharge. Started having bleeding with passage of small clots.  Called on call OB RN and advised to come in.  Denies any significant pain.  Review of  Systems  Positive: vaginal bleeding Negative: abdominal pain  Physical Exam  BP 125/79 (BP Location: Left Arm)   Pulse 89   Temp 98 F (36.7 C)   Resp 17   LMP 12/21/2020   SpO2 99%   General: Awake, no distress  HEENT: Atraumatic  Resp: Normal effort  Cardiac: Normal rate Abd: Nondistended, nontender  MSK: Moves all extremities without difficulty Neuro: Speech clear  Medical Decision Making  Pt evaluated for pregnancy concern and is stable for transfer to MAU. Pt is in agreement with plan for transfer.  11:31 PM Discussed with MAU APP, Hilda Lias, who accepts patient in transfer.  Clinical Impression    Vaginal bleeding in pregnancy.    Garlon Hatchet, PA-C 03/01/21 2331    Sabas Sous, MD 03/02/21 [phone]

## 2021-03-01 NOTE — Progress Notes (Signed)
History:   Brittany Williams is a 25 y.o. G3P2002 at [redacted]w[redacted]d by LMP being seen today for her first obstetrical visit.  Her obstetrical history is significant for:  Obesity in pregnancy, antepartum 03/01/2021   Previous cesarean delivery, antepartum 08/20/2017   History of gestational diabetes in prior pregnancy, currently pregnant 06/01/2017  Patient does intend to breast feed. Pregnancy history fully reviewed.  Patient reports  vaginal odor and discharge for a few days .      HISTORY: OB History  Gravida Para Term Preterm AB Living  3 2 2  0 0 2  SAB IAB Ectopic Multiple Live Births  0 0 0 0 2    # Outcome Date GA Lbr Len/2nd Weight Sex Delivery Anes PTL Lv  3 Current           2 Term 08/20/17 [redacted]w[redacted]d / 00:14 6 lb 7.5 oz (2.934 kg) F CS-LTranv Gen  LIV     Name: [redacted]w[redacted]d     Apgar1: 7  Apgar5: 9  1 Term 05/23/15 [redacted]w[redacted]d 11:46 / 01:03 6 lb 2.6 oz (2.795 kg) F Vag-Spont None  LIV     Name: CASSIDI, MODESITT     Apgar1: 9  Apgar5: 9    Last pap smear was done 2018 and was normal  Past Medical History:  Diagnosis Date   Abnormal Pap smear    Anxiety    Asthma    Chlamydia    Gonorrhea    Hypoglycemia    Ovarian cyst    Past Surgical History:  Procedure Laterality Date   APPENDECTOMY     CESAREAN SECTION N/A 08/20/2017   Procedure: CESAREAN SECTION;  Surgeon: 10/20/2017, MD;  [facility]: [facility];  Service: Obstetrics;  Laterality: N/A;   CYSTECTOMY     WISDOM TOOTH EXTRACTION     Family History  Problem Relation Age of Onset   Cancer Mother    Diabetes Maternal Grandmother    Diabetes Maternal Grandfather    Hypertension Other    Diabetes Other    Other Neg Hx    Social History   Tobacco Use   Smoking status: Former    Packs/day: 0.50    Types: Cigarettes    Quit date: 09/23/2014    Years since quitting: 6.4   Smokeless tobacco: Never  Vaping Use   Vaping Use: Never used  Substance Use Topics   Alcohol use: Not Currently     Comment: not since confirmed pregnancy   Drug use: Not Currently    Types: Marijuana    Comment: not since confirmed pregnancy   Allergies  Allergen Reactions   Latex Itching and Rash   Current Outpatient Medications on File Prior to Visit  Medication Sig Dispense Refill   Prenatal Vit-Fe Fumarate-FA (MULTIVITAMIN-PRENATAL) 27-0.8 MG TABS tablet Take 1 tablet by mouth daily at 12 noon.     [DISCONTINUED] ferrous sulfate 325 (65 FE) MG tablet Take 1 tablet (325 mg total) by mouth 2 (two) times daily with a meal. (Patient not taking: Reported on 09/03/2017) 60 tablet 0   [DISCONTINUED] loratadine (CLARITIN) 10 MG tablet Take 1 tablet (10 mg total) by mouth daily. 30 tablet 11   [DISCONTINUED] norethindrone (MICRONOR,CAMILA,ERRIN) 0.35 MG tablet Take 1 tablet (0.35 mg total) by mouth daily. 1 Package 11   [DISCONTINUED] omeprazole (PRILOSEC) 20 MG capsule Take 1 capsule (20 mg total) by mouth 2 (two) times daily before a meal. (Patient not taking: Reported on 09/03/2017) 60 capsule 5  No current facility-administered medications on file prior to visit.    Review of Systems Pertinent items noted in HPI and remainder of comprehensive ROS otherwise negative.  Physical Exam:   Vitals:   03/01/21 1515  BP: 112/70  Pulse: 99  Weight: 176 lb (79.8 kg)   Fetal Heart Rate (bpm): 160   General: well-developed, well-nourished female in no acute distress  Breasts:  normal appearance, no masses or tenderness bilaterally, exam done in the presence of a chaperone.   Skin: normal coloration and turgor, no rashes  Neurologic: oriented, normal, negative, normal mood  Extremities: normal strength, tone, and muscle mass, ROM of all joints is normal  HEENT PERRLA, extraocular movement intact and sclera clear, anicteric  Neck supple and no masses  Cardiovascular: regular rate and rhythm  Respiratory:  no respiratory distress, normal breath sounds  Abdomen: soft, non-tender; bowel sounds normal; no  masses,  no organomegaly  Pelvic: normal external genitalia, no lesions, normal vaginal mucosa, thin and yellow vaginal discharge seen so testing sample obtained, normal cervix, pap smear done. Exam done in the presence of a chaperone.     Assessment:    Pregnancy: [id number] Patient Active Problem List   Diagnosis Date Noted   Obesity in pregnancy, antepartum 03/01/2021   Supervision of other normal pregnancy, antepartum 02/15/2021   Previous cesarean delivery, antepartum 08/20/2017   History of gestational diabetes in prior pregnancy, currently pregnant 06/01/2017     Plan:    1. History of gestational diabetes in prior pregnancy, currently pregnant - Hemoglobin A1c done. If prediabetic, will need early 2 hr GTT.  2. Obesity in pregnancy, antepartum TWg 11-20 lbs recommended. Labs ordered. ASA prescribed. - Hemoglobin A1c - TSH - Comprehensive metabolic panel - aspirin EC 81 MG tablet; Take 1 tablet (81 mg total) by mouth daily. Take after 12 weeks for prevention of preeclampsia later in pregnancy  Dispense: 300 tablet; Refill: 2 - [place] MFM OB DETAIL +14 WK; Future  3. Previous cesarean delivery, antepartum She is leaning towards TOLAC.  4. Vaginal discharge during pregnancy in first trimester - Cervicovaginal ancillary only done,will follow up results and manage accordingly.  5. Flu vaccine need Vaccine given in LD, tolerated well. - Flu Vaccine QUAD 36+ mos IM (Fluarix, Quad PF)  6. [redacted] weeks gestation of pregnancy 7. Supervision of other normal pregnancy, antepartum - Cytology - PAP( [address]) - Culture, OB Urine - Genetic Screening - CBC/D/Plt+RPR+Rh+ABO+RubIgG... - Enroll patient in Babyscripts Program Initial labs drawn. Continue prenatal vitamins. Problem list reviewed and updated. Genetic Screening discussed, NIPS: ordered. Ultrasound discussed; fetal anatomic survey: ordered. Anticipatory guidance about prenatal visits given including labs, ultrasounds, and  testing. Discussed usage of Babyscripts and virtual visits as additional source of managing and completing prenatal visits in midst of coronavirus and pandemic.   Encouraged to complete MyChart Registration for her ability to review results, send requests, and have questions addressed.  The nature of [place] [facility] Healthcare/Faculty Practice with multiple MDs and Advanced Practice Providers was explained to patient; also emphasized that residents, students are part of our team. Routine obstetric precautions reviewed. Encouraged to seek out care at office or emergency room [facility] MAU preferred) for urgent and/or emergent concerns. Return in about 4 weeks (around 03/29/2021) for OFFICE OB VISIT (MD only).     Jaynie Collins, MD, FACOG Obstetrician & Gynecologist, [facility] for [employer], [facility]

## 2021-03-02 ENCOUNTER — Encounter: Payer: Self-pay | Admitting: Obstetrics and Gynecology

## 2021-03-02 DIAGNOSIS — Z3A10 10 weeks gestation of pregnancy: Secondary | ICD-10-CM | POA: Diagnosis not present

## 2021-03-02 DIAGNOSIS — O209 Hemorrhage in early pregnancy, unspecified: Secondary | ICD-10-CM | POA: Diagnosis not present

## 2021-03-02 DIAGNOSIS — N888 Other specified noninflammatory disorders of cervix uteri: Secondary | ICD-10-CM

## 2021-03-02 DIAGNOSIS — R102 Pelvic and perineal pain: Secondary | ICD-10-CM | POA: Diagnosis not present

## 2021-03-02 LAB — CERVICOVAGINAL ANCILLARY ONLY
Bacterial Vaginitis (gardnerella): POSITIVE — AB
Candida Glabrata: NEGATIVE
Candida Vaginitis: NEGATIVE
Comment: NEGATIVE
Comment: NEGATIVE
Comment: NEGATIVE

## 2021-03-02 LAB — CBC/D/PLT+RPR+RH+ABO+RUBIGG...
Antibody Screen: NEGATIVE
Basophils Absolute: 0 10*3/uL (ref 0.0–0.2)
Basos: 0 %
EOS (ABSOLUTE): 0.2 10*3/uL (ref 0.0–0.4)
Eos: 1 %
HCV Ab: 0.1 s/co ratio (ref 0.0–0.9)
HIV Screen 4th Generation wRfx: NONREACTIVE
Hematocrit: 41.8 % (ref 34.0–46.6)
Hemoglobin: 14.3 g/dL (ref 11.1–15.9)
Hepatitis B Surface Ag: NEGATIVE
Immature Grans (Abs): 0 10*3/uL (ref 0.0–0.1)
Immature Granulocytes: 0 %
Lymphocytes Absolute: 2.3 10*3/uL (ref 0.7–3.1)
Lymphs: 19 %
MCH: 30.6 pg (ref 26.6–33.0)
MCHC: 34.2 g/dL (ref 31.5–35.7)
MCV: 90 fL (ref 79–97)
Monocytes Absolute: 0.8 10*3/uL (ref 0.1–0.9)
Monocytes: 6 %
Neutrophils Absolute: 9 10*3/uL — ABNORMAL HIGH (ref 1.4–7.0)
Neutrophils: 74 %
Platelets: 245 10*3/uL (ref 150–450)
RBC: 4.67 x10E6/uL (ref 3.77–5.28)
RDW: 13.3 % (ref 11.7–15.4)
RPR Ser Ql: NONREACTIVE
Rh Factor: POSITIVE
Rubella Antibodies, IGG: 4.46 index (ref >0.99)
WBC: 12.2 10*3/uL — ABNORMAL HIGH (ref 3.4–10.8)

## 2021-03-02 LAB — TSH: TSH: 0.5 u[IU]/mL (ref 0.450–4.500)

## 2021-03-02 LAB — COMPREHENSIVE METABOLIC PANEL
ALT: 19 IU/L (ref 0–32)
AST: 14 IU/L (ref 0–40)
Albumin/Globulin Ratio: 1.6 (ref 1.2–2.2)
Albumin: 4.2 g/dL (ref 3.9–5.0)
Alkaline Phosphatase: 60 IU/L (ref 44–121)
BUN/Creatinine Ratio: 14 (ref 9–23)
BUN: 8 mg/dL (ref 6–20)
Bilirubin Total: <0.2 mg/dL (ref 0.0–1.2)
CO2: 19 mmol/L — ABNORMAL LOW (ref 20–29)
Calcium: 9.6 mg/dL (ref 8.7–10.2)
Chloride: 101 mmol/L (ref 96–106)
Creatinine, Ser: 0.59 mg/dL (ref 0.57–1.00)
Globulin, Total: 2.6 g/dL (ref 1.5–4.5)
Glucose: 107 mg/dL — ABNORMAL HIGH (ref 70–99)
Potassium: 4 mmol/L (ref 3.5–5.2)
Sodium: 136 mmol/L (ref 134–144)
Total Protein: 6.8 g/dL (ref 6.0–8.5)
eGFR: 127 mL/min/{1.73_m2} (ref >59)

## 2021-03-02 LAB — HEMOGLOBIN A1C
Est. average glucose Bld gHb Est-mCnc: 103 mg/dL
Hgb A1c MFr Bld: 5.2 % (ref 4.8–5.6)

## 2021-03-02 LAB — HCV INTERPRETATION

## 2021-03-02 NOTE — MAU Provider Note (Signed)
Chief Complaint: No chief complaint on file.   Event Date/Time   First Provider Initiated Contact with Patient 03/02/21 0004        SUBJECTIVE HPI: Brittany Williams is a 25 y.o. G3P2002 at [redacted]w[redacted]d by LMP who presents to maternity admissions reporting vaginal bleeding with a few small clots   Had a pap today and did not bleed until tonight, so thought something was wrong. . She denies vaginal itching/burning, urinary symptoms, h/a, dizziness, n/v, or fever/chills.    Vaginal Bleeding The patient's primary symptoms include pelvic pain (mild cramps) and vaginal bleeding. The patient's pertinent negatives include no genital itching, genital lesions or genital odor. The current episode started today. The pain is mild. She is pregnant. Pertinent negatives include no frequency, nausea or vomiting. The vaginal discharge was bloody. The vaginal bleeding is lighter than menses. She has been passing clots. She has not been passing tissue. Nothing aggravates the symptoms. She has tried nothing for the symptoms.   RN Note: Brittany Williams is a 25 y.o. female, G3P2002, at [redacted]w[redacted]d gestation who presents to the emergency department with complaints of vaginal bleeding.  Had  vaginal swabs done earlier today in clinic due to discharge. Started having bleeding with passage of small clots.  Called on call OB RN and advised to come in.  Denies any significant pain.  Past Medical History:  Diagnosis Date   Abnormal Pap smear    Anxiety    Asthma    Chlamydia    Gonorrhea    Hypoglycemia    Ovarian cyst    Past Surgical History:  Procedure Laterality Date   APPENDECTOMY     CESAREAN SECTION N/A 08/20/2017   Procedure: CESAREAN SECTION;  Surgeon: Tereso Newcomer, MD;  [facility];  Service: Obstetrics;  Laterality: N/A;   CYSTECTOMY     WISDOM TOOTH EXTRACTION     Social History   Socioeconomic History   Marital status: Single    Spouse name: Not on file   Number of children: Not on file    Years of education: Not on file   Highest education level: Not on file  Occupational History   Not on file  Tobacco Use   Smoking status: Former    Packs/day: 0.50    Types: Cigarettes    Quit date: 09/23/2014    Years since quitting: 6.4   Smokeless tobacco: Never  Vaping Use   Vaping Use: Never used  Substance and Sexual Activity   Alcohol use: Not Currently    Comment: not since confirmed pregnancy   Drug use: Not Currently    Types: Marijuana    Comment: not since confirmed pregnancy   Sexual activity: Yes    Partners: Male    Birth control/protection: None  Other Topics Concern   Not on file  Social History Narrative   Not on file   Social Determinants of Health   Financial Resource Strain: Not on file  Food Insecurity: Not on file  Transportation Needs: Not on file  Physical Activity: Not on file  Stress: Not on file  Social Connections: Not on file  Intimate Partner Violence: Not on file   No current facility-administered medications on file prior to encounter.   Current Outpatient Medications on File Prior to Encounter  Medication Sig Dispense Refill   aspirin EC 81 MG tablet Take 1 tablet (81 mg total) by mouth daily. Take after 12 weeks for prevention of preeclampsia later in pregnancy 300 tablet  2   Prenatal Vit-Fe Fumarate-FA (MULTIVITAMIN-PRENATAL) 27-0.8 MG TABS tablet Take 1 tablet by mouth daily at 12 noon.     [DISCONTINUED] ferrous sulfate 325 (65 FE) MG tablet Take 1 tablet (325 mg total) by mouth 2 (two) times daily with a meal. (Patient not taking: Reported on 09/03/2017) 60 tablet 0   [DISCONTINUED] loratadine (CLARITIN) 10 MG tablet Take 1 tablet (10 mg total) by mouth daily. 30 tablet 11   [DISCONTINUED] norethindrone (MICRONOR,CAMILA,ERRIN) 0.35 MG tablet Take 1 tablet (0.35 mg total) by mouth daily. 1 Package 11   [DISCONTINUED] omeprazole (PRILOSEC) 20 MG capsule Take 1 capsule (20 mg total) by mouth 2 (two) times daily before a meal. (Patient not  taking: Reported on 09/03/2017) 60 capsule 5   Allergies  Allergen Reactions   Latex Itching and Rash    I have reviewed patient's Past Medical Hx, Surgical Hx, Family Hx, Social Hx, medications and allergies.   ROS:  Review of Systems  Gastrointestinal:  Negative for nausea and vomiting.  Genitourinary:  Positive for pelvic pain (mild cramps) and vaginal bleeding. Negative for frequency.  Review of Systems  Other systems negative   Physical Exam  Physical Exam Patient Vitals for the past 24 hrs:  BP Temp Pulse Resp SpO2  03/01/21 2324 125/79 98 F (36.7 C) 89 17 99 %   Constitutional: Well-developed, well-nourished female in no acute distress.  Cardiovascular: normal rate Respiratory: normal effort GI: Abd soft, non-tender.  MS: Extremities nontender, no edema, normal ROM Neurologic: Alert and oriented x 4.  GU: Neg CVAT.  PELVIC EXAM: Cervix pink, visually closed, without lesion, scant dark red discharge with 1-2 small clots, no active hemorrhage, vaginal walls and external genitalia normal  LAB RESULTS No results found for this or any previous visit (from the past 24 hour(s)).     IMAGING   MAU Management/MDM: Reviewed bleeding is common after a pap test which was done this afternoon. Discussed the delay in bleeding is because the bleeding is light and takes a while to make its way out of the vagina. Reviewed it will take a day or so to stop.  Dr Macon Large aware of presentation  ASSESSMENT [place] IUP at [redacted]w[redacted]d Vaginal bleeding post pap test, likely cervical friability  PLAN Discharge home Watch and wait, return if does not improve after a few days  Pt stable at time of discharge. Encouraged to return here if she develops worsening of symptoms, increase in pain, fever, or other concerning symptoms.    Wynelle Bourgeois CNM, MSN Certified Nurse-Midwife 03/02/2021  12:05 AM

## 2021-03-03 LAB — URINE CULTURE, OB REFLEX

## 2021-03-03 LAB — CULTURE, OB URINE

## 2021-03-03 MED ORDER — METRONIDAZOLE 500 MG PO TABS: 500.0000 mg | ORAL_TABLET | Freq: Two times a day (BID) | ORAL | 0 refills | Status: AC

## 2021-03-03 NOTE — Addendum Note (Signed)
Addended by: Jaynie Collins A on: 03/03/2021 08:33 AM   Modules accepted: Orders

## 2021-03-08 ENCOUNTER — Encounter: Payer: Self-pay | Admitting: Obstetrics & Gynecology

## 2021-03-08 LAB — CYTOLOGY - PAP
Adequacy: ABNORMAL
Chlamydia: NEGATIVE
Comment: NEGATIVE
Comment: NEGATIVE
Comment: NORMAL
Neisseria Gonorrhea: NEGATIVE
Trichomonas: NEGATIVE

## 2021-03-27 ENCOUNTER — Encounter: Payer: Self-pay | Admitting: Obstetrics and Gynecology

## 2021-03-27 ENCOUNTER — Ambulatory Visit (INDEPENDENT_AMBULATORY_CARE_PROVIDER_SITE_OTHER): Payer: Medicaid Other | Admitting: Obstetrics and Gynecology

## 2021-03-27 ENCOUNTER — Other Ambulatory Visit: Payer: Self-pay

## 2021-03-27 VITALS — BP 109/72 | HR 82 | Wt 172.0 lb

## 2021-03-27 DIAGNOSIS — Z3480 Encounter for supervision of other normal pregnancy, unspecified trimester: Secondary | ICD-10-CM

## 2021-03-27 DIAGNOSIS — O99210 Obesity complicating pregnancy, unspecified trimester: Secondary | ICD-10-CM

## 2021-03-27 DIAGNOSIS — Z8632 Personal history of gestational diabetes: Secondary | ICD-10-CM

## 2021-03-27 DIAGNOSIS — O34219 Maternal care for unspecified type scar from previous cesarean delivery: Secondary | ICD-10-CM

## 2021-03-27 DIAGNOSIS — O09299 Supervision of pregnancy with other poor reproductive or obstetric history, unspecified trimester: Secondary | ICD-10-CM

## 2021-03-27 NOTE — Progress Notes (Signed)
ROB 13.5 wks New OB Intake and visit were completed previously Hx VB at 10 wks  Not taking ASA, needs further discussion.

## 2021-03-27 NOTE — Progress Notes (Signed)
   PRENATAL VISIT NOTE  Subjective:  Brittany Williams is a 25 y.o. G3P2002 at [redacted]w[redacted]d being seen today for ongoing prenatal care.  She is currently monitored for the following issues for this high-risk pregnancy and has History of gestational diabetes in prior pregnancy, currently pregnant; Previous cesarean delivery, antepartum; Supervision of other normal pregnancy, antepartum; and Obesity in pregnancy, antepartum on their problem list.  Patient reports no complaints.  Contractions: Not present. Vag. Bleeding: None.   . Denies leaking of fluid.   The following portions of the patient's history were reviewed and updated as appropriate: allergies, current medications, past family history, past medical history, past social history, past surgical history and problem list.   Objective:   Vitals:   03/27/21 1538  BP: 109/72  Pulse: 82  Weight: 172 lb (78 kg)    Fetal Status: Fetal Heart Rate (bpm): 154         General:  Alert, oriented and cooperative. Patient is in no acute distress.  Skin: Skin is warm and dry. No rash noted.   Cardiovascular: Normal heart rate noted  Respiratory: Normal respiratory effort, no problems with respiration noted  Abdomen: Soft, gravid, appropriate for gestational age.  Pain/Pressure: Absent     Pelvic: Cervical exam deferred        Extremities: Normal range of motion.  Edema: None  Mental Status: Normal mood and affect. Normal behavior. Normal judgment and thought content.   Assessment and Plan:  Pregnancy: G3P2002 at [redacted]w[redacted]d 1. Supervision of other normal pregnancy, antepartum Patient is doing well without complaints  2. Previous cesarean delivery, antepartum TOLAC vs RCS to be discussed at a later visit  3. Obesity in pregnancy, antepartum Encouraged the use of ASA  4. History of gestational diabetes in prior pregnancy, currently pregnant Normal A1C  Preterm labor symptoms and general obstetric precautions including but not limited to vaginal  bleeding, contractions, leaking of fluid and fetal movement were reviewed in detail with the patient. Please refer to After Visit Summary for other counseling recommendations.   No follow-ups on file.  Future Appointments  Date Time Provider Department Center  05/03/2021  2:00 PM [facility] NURSE [facility]  05/03/2021  2:15 PM [facility] US2 [facility]    Catalina Antigua, MD

## 2021-03-30 ENCOUNTER — Other Ambulatory Visit: Payer: Self-pay

## 2021-03-30 DIAGNOSIS — O99210 Obesity complicating pregnancy, unspecified trimester: Secondary | ICD-10-CM

## 2021-03-30 MED ORDER — ASPIRIN EC 81 MG PO TBEC: 81.0000 mg | DELAYED_RELEASE_TABLET | Freq: Every day | ORAL | 2 refills | Status: DC

## 2021-03-30 MED ORDER — PRENATAL 27-0.8 MG PO TABS: 1.0000 | ORAL_TABLET | Freq: Every day | ORAL | 12 refills | Status: DC

## 2021-04-16 NOTE — L&D Delivery Note (Addendum)
Delivery Note At 0955 a viable demale infant was delivered via SVD, presentation: LOA. Nuchal x1 and body, reduced. APGAR: 8, 9; weight pending.   Placenta status: spontaneously delivered intact with gentle cord traction. Fundus firm with massage and Pitocin.   Anesthesia: none Lacerations: small periclitoral-hemostatic  Est. Blood Loss (mL): 100 Placenta to LD Complications: precipitous, recurrent variable, thick MSF Cord ph n/a   Mom to postpartum. Baby to Couplet care / Skin to Skin.    Donette Larry, CNM 09/17/2021 10:22 AM

## 2021-04-24 ENCOUNTER — Encounter: Payer: Self-pay | Admitting: Obstetrics and Gynecology

## 2021-04-24 ENCOUNTER — Ambulatory Visit (INDEPENDENT_AMBULATORY_CARE_PROVIDER_SITE_OTHER): Payer: Medicaid Other | Admitting: Obstetrics and Gynecology

## 2021-04-24 ENCOUNTER — Other Ambulatory Visit: Payer: Self-pay

## 2021-04-24 VITALS — BP 117/74 | HR 87 | Wt 175.0 lb

## 2021-04-24 DIAGNOSIS — O09299 Supervision of pregnancy with other poor reproductive or obstetric history, unspecified trimester: Secondary | ICD-10-CM

## 2021-04-24 DIAGNOSIS — O99212 Obesity complicating pregnancy, second trimester: Secondary | ICD-10-CM

## 2021-04-24 DIAGNOSIS — O09292 Supervision of pregnancy with other poor reproductive or obstetric history, second trimester: Secondary | ICD-10-CM

## 2021-04-24 DIAGNOSIS — O99210 Obesity complicating pregnancy, unspecified trimester: Secondary | ICD-10-CM

## 2021-04-24 DIAGNOSIS — Z3A17 17 weeks gestation of pregnancy: Secondary | ICD-10-CM

## 2021-04-24 DIAGNOSIS — Z3480 Encounter for supervision of other normal pregnancy, unspecified trimester: Secondary | ICD-10-CM

## 2021-04-24 DIAGNOSIS — O34219 Maternal care for unspecified type scar from previous cesarean delivery: Secondary | ICD-10-CM

## 2021-04-24 DIAGNOSIS — Z8632 Personal history of gestational diabetes: Secondary | ICD-10-CM

## 2021-04-24 NOTE — Progress Notes (Signed)
+   Fetal movement. No complaints. AFP today.  

## 2021-04-24 NOTE — Progress Notes (Signed)
° °  PRENATAL VISIT NOTE  Subjective:  Brittany Williams is a 26 y.o. G3P2002 at [redacted]w[redacted]d being seen today for ongoing prenatal care.  She is currently monitored for the following issues for this low-risk pregnancy and has History of gestational diabetes in prior pregnancy, currently pregnant; Previous cesarean delivery, antepartum; Supervision of other normal pregnancy, antepartum; and Obesity in pregnancy, antepartum on their problem list.  Patient reports no complaints.  Contractions: Not present. Vag. Bleeding: None.  Movement: Increased. Denies leaking of fluid.   The following portions of the patient's history were reviewed and updated as appropriate: allergies, current medications, past family history, past medical history, past social history, past surgical history and problem list.   Objective:   Vitals:   04/24/21 1531  BP: 117/74  Pulse: 87  Weight: 175 lb (79.4 kg)    Fetal Status: Fetal Heart Rate (bpm): 158   Movement: Increased     General:  Alert, oriented and cooperative. Patient is in no acute distress.  Skin: Skin is warm and dry. No rash noted.   Cardiovascular: Normal heart rate noted  Respiratory: Normal respiratory effort, no problems with respiration noted  Abdomen: Soft, gravid, appropriate for gestational age.  Pain/Pressure: Absent     Pelvic: Cervical exam deferred        Extremities: Normal range of motion.  Edema: None  Mental Status: Normal mood and affect. Normal behavior. Normal judgment and thought content.   Assessment and Plan:  Pregnancy: G3P2002 at [redacted]w[redacted]d 1. Supervision of other normal pregnancy, antepartum Patient is doing well without complaints  2. Obesity in pregnancy, antepartum Continue ASA  3. Previous cesarean delivery, antepartum Delivery method to be discussed at a later date  4. History of gestational diabetes in prior pregnancy, currently pregnant Normal A1c early  Preterm labor symptoms and general obstetric precautions including  but not limited to vaginal bleeding, contractions, leaking of fluid and fetal movement were reviewed in detail with the patient. Please refer to After Visit Summary for other counseling recommendations.   Return in about 4 weeks (around 05/22/2021) for in person, ROB, Low risk.  Future Appointments  Date Time Provider Department Center  05/08/2021  2:00 PM [facility] NURSE [facility]  05/08/2021  2:15 PM [facility] US2 [facility]    Catalina Antigua, MD

## 2021-04-26 LAB — AFP, SERUM, OPEN SPINA BIFIDA
AFP MoM: 1.42
AFP Value: 54.8 ng/mL
Gest. Age on Collection Date: 17.5 weeks
Maternal Age At EDD: 27.7 yr
OSBR Risk 1 IN: 3404
Test Results:: NEGATIVE
Weight: 175 [lb_av]

## 2021-05-03 ENCOUNTER — Ambulatory Visit: Payer: Medicaid Other

## 2021-05-03 ENCOUNTER — Other Ambulatory Visit: Payer: Self-pay

## 2021-05-08 ENCOUNTER — Ambulatory Visit: Payer: Medicaid Other | Attending: Obstetrics & Gynecology | Admitting: *Deleted

## 2021-05-08 ENCOUNTER — Other Ambulatory Visit: Payer: Self-pay | Admitting: *Deleted

## 2021-05-08 ENCOUNTER — Encounter: Payer: Self-pay | Admitting: *Deleted

## 2021-05-08 ENCOUNTER — Other Ambulatory Visit: Payer: Self-pay

## 2021-05-08 ENCOUNTER — Ambulatory Visit (HOSPITAL_BASED_OUTPATIENT_CLINIC_OR_DEPARTMENT_OTHER): Payer: Medicaid Other

## 2021-05-08 VITALS — BP 109/62 | HR 90

## 2021-05-08 DIAGNOSIS — O34219 Maternal care for unspecified type scar from previous cesarean delivery: Secondary | ICD-10-CM | POA: Insufficient documentation

## 2021-05-08 DIAGNOSIS — E668 Other obesity: Secondary | ICD-10-CM | POA: Diagnosis not present

## 2021-05-08 DIAGNOSIS — O09292 Supervision of pregnancy with other poor reproductive or obstetric history, second trimester: Secondary | ICD-10-CM | POA: Diagnosis not present

## 2021-05-08 DIAGNOSIS — E669 Obesity, unspecified: Secondary | ICD-10-CM | POA: Insufficient documentation

## 2021-05-08 DIAGNOSIS — O09299 Supervision of pregnancy with other poor reproductive or obstetric history, unspecified trimester: Secondary | ICD-10-CM

## 2021-05-08 DIAGNOSIS — O99212 Obesity complicating pregnancy, second trimester: Secondary | ICD-10-CM

## 2021-05-08 DIAGNOSIS — Z3689 Encounter for other specified antenatal screening: Secondary | ICD-10-CM

## 2021-05-08 DIAGNOSIS — Z363 Encounter for antenatal screening for malformations: Secondary | ICD-10-CM | POA: Insufficient documentation

## 2021-05-08 DIAGNOSIS — O99210 Obesity complicating pregnancy, unspecified trimester: Secondary | ICD-10-CM

## 2021-05-08 DIAGNOSIS — O283 Abnormal ultrasonic finding on antenatal screening of mother: Secondary | ICD-10-CM

## 2021-05-08 DIAGNOSIS — Z3A19 19 weeks gestation of pregnancy: Secondary | ICD-10-CM

## 2021-05-08 DIAGNOSIS — Z3480 Encounter for supervision of other normal pregnancy, unspecified trimester: Secondary | ICD-10-CM

## 2021-05-22 ENCOUNTER — Ambulatory Visit (INDEPENDENT_AMBULATORY_CARE_PROVIDER_SITE_OTHER): Payer: Medicaid Other | Admitting: Advanced Practice Midwife

## 2021-05-22 ENCOUNTER — Other Ambulatory Visit: Payer: Self-pay

## 2021-05-22 VITALS — BP 110/66 | HR 96 | Wt 176.2 lb

## 2021-05-22 DIAGNOSIS — Z3480 Encounter for supervision of other normal pregnancy, unspecified trimester: Secondary | ICD-10-CM

## 2021-05-22 DIAGNOSIS — O2612 Low weight gain in pregnancy, second trimester: Secondary | ICD-10-CM

## 2021-05-22 DIAGNOSIS — R102 Pelvic and perineal pain unspecified side: Secondary | ICD-10-CM

## 2021-05-22 DIAGNOSIS — O26893 Other specified pregnancy related conditions, third trimester: Secondary | ICD-10-CM

## 2021-05-22 DIAGNOSIS — Z3A21 21 weeks gestation of pregnancy: Secondary | ICD-10-CM

## 2021-05-22 DIAGNOSIS — O34219 Maternal care for unspecified type scar from previous cesarean delivery: Secondary | ICD-10-CM

## 2021-05-22 NOTE — Progress Notes (Signed)
° °  PRENATAL VISIT NOTE  Subjective:  Brittany Williams is a 26 y.o. G3P2002 at [redacted]w[redacted]d being seen today for ongoing prenatal care.  She is currently monitored for the following issues for this low-risk pregnancy and has History of gestational diabetes in prior pregnancy, currently pregnant; Previous cesarean delivery, antepartum; Supervision of other normal pregnancy, antepartum; and Obesity in pregnancy, antepartum on their problem list.  Patient reports  pelvic pressure, poor weight gain in pregnancy .  Contractions: Irritability. Vag. Bleeding: None.  Movement: Present. Denies leaking of fluid.   The following portions of the patient's history were reviewed and updated as appropriate: allergies, current medications, past family history, past medical history, past social history, past surgical history and problem list.   Objective:   Vitals:   05/22/21 1528  BP: 110/66  Pulse: 96  Weight: 176 lb 3.2 oz (79.9 kg)    Fetal Status: Fetal Heart Rate (bpm): 156   Movement: Present     General:  Alert, oriented and cooperative. Patient is in no acute distress.  Skin: Skin is warm and dry. No rash noted.   Cardiovascular: Normal heart rate noted  Respiratory: Normal respiratory effort, no problems with respiration noted  Abdomen: Soft, gravid, appropriate for gestational age.  Pain/Pressure: Present     Pelvic: Cervical exam performed in the presence of a chaperone Dilation: Closed Effacement (%): 0 Station: Ballotable  Extremities: Normal range of motion.  Edema: Trace  Mental Status: Normal mood and affect. Normal behavior. Normal judgment and thought content.   Assessment and Plan:  Pregnancy: G3P2002 at [redacted]w[redacted]d 1. Supervision of other normal pregnancy, antepartum --Anticipatory guidance about next visits/weeks of pregnancy given.  --Next visit in 4 weeks  2. [redacted] weeks gestation of pregnancy   3. Previous cesarean delivery, antepartum --Desires VBAC, needs consent  4. Poor weight gain  of pregnancy, second trimester --Pt reports she lost weight in first trimester from 180 to 174 and has not gained any weight yet.  She is eating, and is adding Ensure/protein shakes 2-3 times per day.  She denies any n/v but does report she is not hungry when working just snacks, then eats when she gets home. --Keep food log, count calories to make sure she is getting enough --Continue Ensure/protein shakes  --F/U at next visit, should start to see more gain after this visit --FH wnl today  5. 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 Please refer to After Visit Summary for other counseling recommendations.   Return in about 4 weeks (around 06/19/2021).  Future Appointments  Date Time Provider [place]  06/05/2021  2:45 PM [facility] NURSE [facility]  06/05/2021  3:00 PM [facility] US[facility]  06/19/2021  3:30 PM [place], Kathie Dike, CNM [facility] None    Fatima Blank, CNM

## 2021-05-22 NOTE — Addendum Note (Signed)
Addended by: Sharen Counter A on: 05/22/2021 04:28 PM   Modules accepted: Orders

## 2021-05-24 LAB — URINE CULTURE, OB REFLEX

## 2021-05-24 LAB — CULTURE, OB URINE

## 2021-05-31 ENCOUNTER — Telehealth: Payer: Medicaid Other | Admitting: Physician Assistant

## 2021-05-31 DIAGNOSIS — J302 Other seasonal allergic rhinitis: Secondary | ICD-10-CM

## 2021-05-31 NOTE — Progress Notes (Signed)
E visit for Allergic Rhinitis We are sorry that you are not feeling well.  Here is how we plan to help!  Based on what you have shared with me it looks like you have Allergic Rhinitis.  Rhinitis is when a reaction occurs that causes nasal congestion, runny nose, sneezing, and itching.  Most types of rhinitis are caused by an inflammation and are associated with symptoms in the eyes ears or throat. There are several types of rhinitis.  The most common are acute rhinitis, which is usually caused by a viral illness, allergic or seasonal rhinitis, and nonallergic or year-round rhinitis.  Nasal allergies occur certain times of the year.  Allergic rhinitis is caused when allergens in the air trigger the release of histamine in the body.  Histamine causes itching, swelling, and fluid to build up in the fragile linings of the nasal passages, sinuses and eyelids.  An itchy nose and clear discharge are common.  I recommend the following over the counter treatments: You should take a daily dose of antihistamine -- Keep up with the Claritin or you can switch to Zyrtec or Xyzal as these are ok for use in breastfeeding but you want to monitor baby for drowsiness or fussiness.   I also would recommend a nasal spray: Saline 1 spray into each nostril as needed  You may also benefit from eye drops such as: Visine 1-2 drops each eye twice daily as needed  HOME CARE:  You can use an over-the-counter saline nasal spray as needed Avoid areas where there is heavy dust, mites, or molds Stay indoors on windy days during the pollen season Keep windows closed in home, at least in bedroom; use air conditioner. Use high-efficiency house air filter Keep windows closed in car, turn AC on re-circulate Avoid playing out with dog during pollen season  GET HELP RIGHT AWAY IF:  If your symptoms do not improve within 10 days You become short of breath You develop yellow or green discharge from your nose for over 3 days You  have coughing fits  MAKE SURE YOU:  Understand these instructions Will watch your condition Will get help right away if you are not doing well or get worse  Thank you for choosing an e-visit. Your e-visit answers were reviewed by a board certified advanced clinical practitioner to complete your personal care plan. Depending upon the condition, your plan could have included both over the counter or prescription medications. Please review your pharmacy choice. Be sure that the pharmacy you have chosen is open so that you can pick up your prescription now.  If there is a problem you may message your provider in MyChart to have the prescription routed to another pharmacy. Your safety is important to [place]. If you have drug allergies check your prescription carefully.  For the next 24 hours, you can use MyChart to ask questions about todays visit, request a non-urgent call back, or ask for a work or school excuse from your e-visit provider. You will get an email in the next two days asking about your experience. I hope that your e-visit has been valuable and will speed your recovery.

## 2021-05-31 NOTE — Progress Notes (Signed)
I have spent 5 minutes in review of e-visit questionnaire, review and updating patient chart, medical decision making and response to patient.   Lanika Colgate Cody Jaycey Gens, PA-C    

## 2021-06-05 ENCOUNTER — Ambulatory Visit: Payer: Medicaid Other | Admitting: *Deleted

## 2021-06-05 ENCOUNTER — Other Ambulatory Visit: Payer: Self-pay

## 2021-06-05 ENCOUNTER — Ambulatory Visit: Payer: Medicaid Other | Attending: Obstetrics and Gynecology

## 2021-06-05 VITALS — BP 106/60 | HR 82

## 2021-06-05 DIAGNOSIS — O09292 Supervision of pregnancy with other poor reproductive or obstetric history, second trimester: Secondary | ICD-10-CM | POA: Diagnosis not present

## 2021-06-05 DIAGNOSIS — O99212 Obesity complicating pregnancy, second trimester: Secondary | ICD-10-CM | POA: Diagnosis not present

## 2021-06-05 DIAGNOSIS — Z3A23 23 weeks gestation of pregnancy: Secondary | ICD-10-CM | POA: Insufficient documentation

## 2021-06-05 DIAGNOSIS — E668 Other obesity: Secondary | ICD-10-CM | POA: Diagnosis not present

## 2021-06-05 DIAGNOSIS — Z363 Encounter for antenatal screening for malformations: Secondary | ICD-10-CM | POA: Insufficient documentation

## 2021-06-05 DIAGNOSIS — O283 Abnormal ultrasonic finding on antenatal screening of mother: Secondary | ICD-10-CM

## 2021-06-05 DIAGNOSIS — Z3480 Encounter for supervision of other normal pregnancy, unspecified trimester: Secondary | ICD-10-CM

## 2021-06-05 DIAGNOSIS — O09299 Supervision of pregnancy with other poor reproductive or obstetric history, unspecified trimester: Secondary | ICD-10-CM | POA: Insufficient documentation

## 2021-06-05 DIAGNOSIS — Z8632 Personal history of gestational diabetes: Secondary | ICD-10-CM | POA: Insufficient documentation

## 2021-06-05 DIAGNOSIS — O34219 Maternal care for unspecified type scar from previous cesarean delivery: Secondary | ICD-10-CM | POA: Insufficient documentation

## 2021-06-19 ENCOUNTER — Other Ambulatory Visit: Payer: Self-pay

## 2021-06-19 ENCOUNTER — Ambulatory Visit (INDEPENDENT_AMBULATORY_CARE_PROVIDER_SITE_OTHER): Payer: Medicaid Other | Admitting: Advanced Practice Midwife

## 2021-06-19 VITALS — BP 124/76 | HR 91 | Wt 180.4 lb

## 2021-06-19 DIAGNOSIS — Z3480 Encounter for supervision of other normal pregnancy, unspecified trimester: Secondary | ICD-10-CM

## 2021-06-19 DIAGNOSIS — Z8632 Personal history of gestational diabetes: Secondary | ICD-10-CM

## 2021-06-19 DIAGNOSIS — O09299 Supervision of pregnancy with other poor reproductive or obstetric history, unspecified trimester: Secondary | ICD-10-CM

## 2021-06-19 DIAGNOSIS — O34219 Maternal care for unspecified type scar from previous cesarean delivery: Secondary | ICD-10-CM

## 2021-06-19 DIAGNOSIS — O2612 Low weight gain in pregnancy, second trimester: Secondary | ICD-10-CM

## 2021-06-19 NOTE — Progress Notes (Signed)
? ?  PRENATAL VISIT NOTE ? ?Subjective:  ?Brittany Williams is a 26 y.o. G3P2002 at [redacted]w[redacted]d being seen today for ongoing prenatal care.  She is currently monitored for the following issues for this low-risk pregnancy and has History of gestational diabetes in prior pregnancy, currently pregnant; Previous cesarean delivery, antepartum; Supervision of other normal pregnancy, antepartum; and Obesity in pregnancy, antepartum on their problem list. ? ?Patient reports occasional contractions.  Contractions: Irritability. Vag. Bleeding: None.  Movement: Present. Denies leaking of fluid.  ? ?The following portions of the patient's history were reviewed and updated as appropriate: allergies, current medications, past family history, past medical history, past social history, past surgical history and problem list.  ? ?Objective:  ? ?Vitals:  ? 06/19/21 1530  ?BP: 124/76  ?Pulse: 91  ?Weight: 180 lb 6.4 oz (81.8 kg)  ? ? ?Fetal Status: Fetal Heart Rate (bpm): 155 Fundal Height: 26 cm Movement: Present    ? ?General:  Alert, oriented and cooperative. Patient is in no acute distress.  ?Skin: Skin is warm and dry. No rash noted.   ?Cardiovascular: Normal heart rate noted  ?Respiratory: Normal respiratory effort, no problems with respiration noted  ?Abdomen: Soft, gravid, appropriate for gestational age.  Pain/Pressure: Absent     ?Pelvic: Cervical exam deferred        ?Extremities: Normal range of motion.     ?Mental Status: Normal mood and affect. Normal behavior. Normal judgment and thought content.  ? ?Assessment and Plan:  ?Pregnancy: [id number] at [redacted]w[redacted]d ?1. Supervision of other normal pregnancy, antepartum ?--Anticipatory guidance about next visits/weeks of pregnancy given.  ?--Next visit in 3-4 weeks for GTT ? ?2. Previous cesarean delivery, antepartum ?--Desires VBAC, next appt with MD for consent ? ?3. Poor weight gain of pregnancy, second trimester ?--FH wnl today, total weight gain 9lbs, pregravid BMI 32. ?--Continue healthy  eating, make sure plenty of protein  ? ?Preterm labor symptoms and general obstetric precautions including but not limited to vaginal bleeding, contractions, leaking of fluid and fetal movement were reviewed in detail with the patient. ?Please refer to After Visit Summary for other counseling recommendations.  ? ?Return in about 3 weeks (around 07/10/2021). ? ?No future appointments. ? ? ?Fatima Blank, CNM  ?

## 2021-07-11 ENCOUNTER — Other Ambulatory Visit: Payer: Medicaid Other

## 2021-07-11 ENCOUNTER — Encounter: Payer: Medicaid Other | Admitting: Obstetrics & Gynecology

## 2021-07-12 ENCOUNTER — Encounter: Payer: Self-pay | Admitting: Obstetrics

## 2021-07-12 ENCOUNTER — Other Ambulatory Visit: Payer: Medicaid Other

## 2021-07-12 ENCOUNTER — Other Ambulatory Visit: Payer: Self-pay

## 2021-07-12 ENCOUNTER — Ambulatory Visit (INDEPENDENT_AMBULATORY_CARE_PROVIDER_SITE_OTHER): Payer: Medicaid Other | Admitting: Obstetrics

## 2021-07-12 VITALS — BP 115/76 | HR 97 | Wt 187.0 lb

## 2021-07-12 DIAGNOSIS — Z3480 Encounter for supervision of other normal pregnancy, unspecified trimester: Secondary | ICD-10-CM | POA: Diagnosis not present

## 2021-07-12 DIAGNOSIS — O99210 Obesity complicating pregnancy, unspecified trimester: Secondary | ICD-10-CM

## 2021-07-12 DIAGNOSIS — Z23 Encounter for immunization: Secondary | ICD-10-CM

## 2021-07-12 DIAGNOSIS — Z8632 Personal history of gestational diabetes: Secondary | ICD-10-CM

## 2021-07-12 DIAGNOSIS — O34219 Maternal care for unspecified type scar from previous cesarean delivery: Secondary | ICD-10-CM

## 2021-07-12 DIAGNOSIS — Z3689 Encounter for other specified antenatal screening: Secondary | ICD-10-CM

## 2021-07-12 DIAGNOSIS — O09299 Supervision of pregnancy with other poor reproductive or obstetric history, unspecified trimester: Secondary | ICD-10-CM

## 2021-07-12 DIAGNOSIS — J301 Allergic rhinitis due to pollen: Secondary | ICD-10-CM

## 2021-07-12 MED ORDER — LORATADINE 10 MG PO TABS: 10.0000 mg | ORAL_TABLET | Freq: Every day | ORAL | 11 refills | Status: DC

## 2021-07-12 NOTE — Progress Notes (Signed)
Subjective:  ?TALEYA Williams is a 26 y.o. G3P2002 at [redacted]w[redacted]d being seen today for ongoing prenatal care.  She is currently monitored for the following issues for this low-risk pregnancy and has History of gestational diabetes in prior pregnancy, currently pregnant; Previous cesarean delivery, antepartum; Supervision of other normal pregnancy, antepartum; and Obesity in pregnancy, antepartum on their problem list. ? ?Patient reports no complaints.  Contractions: Not present. Vag. Bleeding: None.  Movement: Present. Denies leaking of fluid.  ? ?The following portions of the patient's history were reviewed and updated as appropriate: allergies, current medications, past family history, past medical history, past social history, past surgical history and problem list. Problem list updated. ? ?Objective:  ? ?Vitals:  ? 07/12/21 0916  ?BP: 115/76  ?Pulse: 97  ?Weight: 187 lb (84.8 kg)  ? ? ?Fetal Status:     Movement: Present    ? ?General:  Alert, oriented and cooperative. Patient is in no acute distress.  ?Skin: Skin is warm and dry. No rash noted.   ?Cardiovascular: Normal heart rate noted  ?Respiratory: Normal respiratory effort, no problems with respiration noted  ?Abdomen: Soft, gravid, appropriate for gestational age. Pain/Pressure: Present     ?Pelvic:  Cervical exam deferred        ?Extremities: Normal range of motion.  Edema: None  ?Mental Status: Normal mood and affect. Normal behavior. Normal judgment and thought content.  ? ?Urinalysis:     ? ?Assessment and Plan:  ?Pregnancy: [id number] at [redacted]w[redacted]d ? ?1. Supervision of other normal pregnancy, antepartum ?Rx: ?- Tdap vaccine greater than or equal to 7yo IM ?- Glucose Tolerance, 2 Hours w/1 Hour ?- HIV antibody (with reflex) ?- RPR ?- CBC ? ?2. Previous cesarean delivery, antepartum ? ?3. H/O gestational diabetes in prior pregnancy, currently pregnant ? ?4. Obesity in pregnancy, antepartum ? ?5. Encounter for ultrasound to assess interval growth of fetus ?Rx: ?- [place]  MFM OB FOLLOW UP; Future ? ?6. Seasonal allergic rhinitis due to pollen ?Rx: ?- loratadine (CLARITIN) 10 MG tablet; Take 1 tablet (10 mg total) by mouth daily.  Dispense: 30 tablet; Refill: 11  ? ?Preterm labor symptoms and general obstetric precautions including but not limited to vaginal bleeding, contractions, leaking of fluid and fetal movement were reviewed in detail with the patient. ?Please refer to After Visit Summary for other counseling recommendations.  ? ?Return in about 2 weeks (around 07/26/2021) for ROB. ? ? ?Brock Bad, MD  ?07/12/21  ?

## 2021-07-12 NOTE — Progress Notes (Signed)
Pt in office for ROB visit and GTT. She does not have any concerns today.  ?

## 2021-07-13 LAB — CBC
Hematocrit: 38.9 % (ref 34.0–46.6)
Hemoglobin: 13.1 g/dL (ref 11.1–15.9)
MCH: 29.7 pg (ref 26.6–33.0)
MCHC: 33.7 g/dL (ref 31.5–35.7)
MCV: 88 fL (ref 79–97)
Platelets: 212 10*3/uL (ref 150–450)
RBC: 4.41 x10E6/uL (ref 3.77–5.28)
RDW: 13.6 % (ref 11.7–15.4)
WBC: 12.7 10*3/uL — ABNORMAL HIGH (ref 3.4–10.8)

## 2021-07-13 LAB — GLUCOSE TOLERANCE, 2 HOURS W/ 1HR
Glucose, 1 hour: 190 mg/dL — ABNORMAL HIGH (ref 70–179)
Glucose, 2 hour: 157 mg/dL — ABNORMAL HIGH (ref 70–152)
Glucose, Fasting: 78 mg/dL (ref 70–91)

## 2021-07-13 LAB — RPR: RPR Ser Ql: NONREACTIVE

## 2021-07-13 LAB — HIV ANTIBODY (ROUTINE TESTING W REFLEX): HIV Screen 4th Generation wRfx: NONREACTIVE

## 2021-07-14 ENCOUNTER — Encounter: Payer: Medicaid Other | Admitting: Obstetrics

## 2021-07-14 ENCOUNTER — Other Ambulatory Visit: Payer: Medicaid Other

## 2021-07-17 ENCOUNTER — Ambulatory Visit: Payer: Medicaid Other | Attending: Obstetrics

## 2021-07-17 ENCOUNTER — Telehealth: Payer: Self-pay | Admitting: Emergency Medicine

## 2021-07-17 ENCOUNTER — Other Ambulatory Visit: Payer: Self-pay | Admitting: Obstetrics and Gynecology

## 2021-07-17 ENCOUNTER — Ambulatory Visit: Payer: Medicaid Other | Admitting: *Deleted

## 2021-07-17 VITALS — BP 106/58 | HR 93

## 2021-07-17 DIAGNOSIS — O09299 Supervision of pregnancy with other poor reproductive or obstetric history, unspecified trimester: Secondary | ICD-10-CM

## 2021-07-17 DIAGNOSIS — O09293 Supervision of pregnancy with other poor reproductive or obstetric history, third trimester: Secondary | ICD-10-CM | POA: Insufficient documentation

## 2021-07-17 DIAGNOSIS — O24419 Gestational diabetes mellitus in pregnancy, unspecified control: Secondary | ICD-10-CM | POA: Insufficient documentation

## 2021-07-17 DIAGNOSIS — Z3480 Encounter for supervision of other normal pregnancy, unspecified trimester: Secondary | ICD-10-CM

## 2021-07-17 DIAGNOSIS — O99213 Obesity complicating pregnancy, third trimester: Secondary | ICD-10-CM

## 2021-07-17 DIAGNOSIS — Z363 Encounter for antenatal screening for malformations: Secondary | ICD-10-CM | POA: Diagnosis not present

## 2021-07-17 DIAGNOSIS — O34219 Maternal care for unspecified type scar from previous cesarean delivery: Secondary | ICD-10-CM | POA: Diagnosis not present

## 2021-07-17 DIAGNOSIS — Z3689 Encounter for other specified antenatal screening: Secondary | ICD-10-CM | POA: Diagnosis not present

## 2021-07-17 DIAGNOSIS — O321XX0 Maternal care for breech presentation, not applicable or unspecified: Secondary | ICD-10-CM | POA: Diagnosis not present

## 2021-07-17 DIAGNOSIS — O24410 Gestational diabetes mellitus in pregnancy, diet controlled: Secondary | ICD-10-CM

## 2021-07-17 DIAGNOSIS — Z3A29 29 weeks gestation of pregnancy: Secondary | ICD-10-CM | POA: Diagnosis not present

## 2021-07-17 DIAGNOSIS — Z8632 Personal history of gestational diabetes: Secondary | ICD-10-CM | POA: Insufficient documentation

## 2021-07-17 MED ORDER — ACCU-CHEK SOFTCLIX LANCETS MISC: 12 refills | Status: DC

## 2021-07-17 MED ORDER — ACCU-CHEK GUIDE W/DEVICE KIT: 1.0000 | PACK | Freq: Four times a day (QID) | 0 refills | Status: DC

## 2021-07-17 MED ORDER — ACCU-CHEK GUIDE VI STRP: ORAL_STRIP | 12 refills | Status: DC

## 2021-07-17 NOTE — Telephone Encounter (Signed)
TC to patient to discuss GTT results. Diabetes education referral put in. Diabetes sent to pt pharmacy.  ?Pt verbalized understanding.  ?

## 2021-07-18 ENCOUNTER — Telehealth: Payer: Self-pay

## 2021-07-18 NOTE — Telephone Encounter (Signed)
Left message for patient to call office back - trying to change time of 5/1 ultrasound appointment or move to Friday 4/28 due to [place] Tech. Request for PAL on 5/1 ? ?

## 2021-07-19 ENCOUNTER — Telehealth: Payer: Self-pay | Admitting: Emergency Medicine

## 2021-07-19 NOTE — Telephone Encounter (Signed)
RC to patient who LM on nurse line. Unable to leave message. ?

## 2021-07-24 ENCOUNTER — Encounter: Payer: Medicaid Other | Admitting: Obstetrics

## 2021-08-08 ENCOUNTER — Ambulatory Visit (INDEPENDENT_AMBULATORY_CARE_PROVIDER_SITE_OTHER): Payer: Medicaid Other | Admitting: Obstetrics

## 2021-08-08 ENCOUNTER — Encounter: Payer: Self-pay | Admitting: Obstetrics

## 2021-08-08 VITALS — BP 114/72 | HR 112 | Wt 184.0 lb

## 2021-08-08 DIAGNOSIS — O34219 Maternal care for unspecified type scar from previous cesarean delivery: Secondary | ICD-10-CM

## 2021-08-08 DIAGNOSIS — Z3480 Encounter for supervision of other normal pregnancy, unspecified trimester: Secondary | ICD-10-CM

## 2021-08-08 NOTE — Progress Notes (Signed)
Subjective:  ?Brittany Williams is a 26 y.o. G3P2002 at [redacted]w[redacted]d being seen today for ongoing prenatal care.  She is currently monitored for the following issues for this low-risk pregnancy and has History of gestational diabetes in prior pregnancy, currently pregnant; Previous cesarean delivery, antepartum; Supervision of other normal pregnancy, antepartum; and Obesity in pregnancy, antepartum on their problem list. ? ?Patient reports no complaints.  Contractions: Not present. Vag. Bleeding: None.  Movement: Present. Denies leaking of fluid.  ? ?The following portions of the patient's history were reviewed and updated as appropriate: allergies, current medications, past family history, past medical history, past social history, past surgical history and problem list. Problem list updated. ? ?Objective:  ? ?Vitals:  ? 08/08/21 1343  ?BP: 114/72  ?Pulse: (!) 112  ?Weight: 184 lb (83.5 kg)  ? ? ?Fetal Status:     Movement: Present    ? ?General:  Alert, oriented and cooperative. Patient is in no acute distress.  ?Skin: Skin is warm and dry. No rash noted.   ?Cardiovascular: Normal heart rate noted  ?Respiratory: Normal respiratory effort, no problems with respiration noted  ?Abdomen: Soft, gravid, appropriate for gestational age. Pain/Pressure: Present     ?Pelvic:  Cervical exam deferred        ?Extremities: Normal range of motion.     ?Mental Status: Normal mood and affect. Normal behavior. Normal judgment and thought content.  ? ?Urinalysis:     ? ?Assessment and Plan:  ?Pregnancy: [id number] at [redacted]w[redacted]d ? ?There are no diagnoses linked to this encounter. ?Preterm labor symptoms and general obstetric precautions including but not limited to vaginal bleeding, contractions, leaking of fluid and fetal movement were reviewed in detail with the patient. ?Please refer to After Visit Summary for other counseling recommendations.  ? ?Return in about 2 weeks (around 08/22/2021) for [place]. ? ? ?Shelly Bombard, MD  ?08/09/21  ?

## 2021-08-08 NOTE — Progress Notes (Signed)
Pt states she doesn't feel like she is getting good circulation, has been using belt and pregnancy tape.  ?Pt did have some recent spotting after intercourse - has not continued. ?Was given allergy Rx, states not working well.  ?Pt has had recent changes in HR- going from 90's to 120's.  ? ? ?

## 2021-08-14 ENCOUNTER — Ambulatory Visit: Payer: Medicaid Other | Attending: Obstetrics and Gynecology

## 2021-08-14 ENCOUNTER — Other Ambulatory Visit: Payer: Self-pay | Admitting: *Deleted

## 2021-08-14 ENCOUNTER — Ambulatory Visit: Payer: Medicaid Other | Admitting: *Deleted

## 2021-08-14 ENCOUNTER — Telehealth: Payer: Self-pay | Admitting: Emergency Medicine

## 2021-08-14 VITALS — BP 115/65 | HR 93

## 2021-08-14 DIAGNOSIS — O09293 Supervision of pregnancy with other poor reproductive or obstetric history, third trimester: Secondary | ICD-10-CM | POA: Insufficient documentation

## 2021-08-14 DIAGNOSIS — O34219 Maternal care for unspecified type scar from previous cesarean delivery: Secondary | ICD-10-CM

## 2021-08-14 DIAGNOSIS — O24013 Pre-existing diabetes mellitus, type 1, in pregnancy, third trimester: Secondary | ICD-10-CM

## 2021-08-14 DIAGNOSIS — Z3480 Encounter for supervision of other normal pregnancy, unspecified trimester: Secondary | ICD-10-CM | POA: Insufficient documentation

## 2021-08-14 DIAGNOSIS — Z3A33 33 weeks gestation of pregnancy: Secondary | ICD-10-CM | POA: Diagnosis not present

## 2021-08-14 DIAGNOSIS — Z362 Encounter for other antenatal screening follow-up: Secondary | ICD-10-CM | POA: Diagnosis present

## 2021-08-14 DIAGNOSIS — O24419 Gestational diabetes mellitus in pregnancy, unspecified control: Secondary | ICD-10-CM | POA: Diagnosis not present

## 2021-08-14 DIAGNOSIS — Z8632 Personal history of gestational diabetes: Secondary | ICD-10-CM | POA: Insufficient documentation

## 2021-08-14 DIAGNOSIS — O99213 Obesity complicating pregnancy, third trimester: Secondary | ICD-10-CM

## 2021-08-14 DIAGNOSIS — O09299 Supervision of pregnancy with other poor reproductive or obstetric history, unspecified trimester: Secondary | ICD-10-CM

## 2021-08-14 DIAGNOSIS — E669 Obesity, unspecified: Secondary | ICD-10-CM

## 2021-08-14 DIAGNOSIS — O24410 Gestational diabetes mellitus in pregnancy, diet controlled: Secondary | ICD-10-CM

## 2021-08-14 NOTE — Telephone Encounter (Signed)
Breast Pump order request faxed w/ confirmation. ?

## 2021-08-21 ENCOUNTER — Ambulatory Visit (INDEPENDENT_AMBULATORY_CARE_PROVIDER_SITE_OTHER): Payer: Medicaid Other | Admitting: Obstetrics and Gynecology

## 2021-08-21 ENCOUNTER — Encounter: Payer: Self-pay | Admitting: Obstetrics and Gynecology

## 2021-08-21 VITALS — BP 115/71 | HR 82 | Wt 188.0 lb

## 2021-08-21 DIAGNOSIS — O34219 Maternal care for unspecified type scar from previous cesarean delivery: Secondary | ICD-10-CM

## 2021-08-21 DIAGNOSIS — O99210 Obesity complicating pregnancy, unspecified trimester: Secondary | ICD-10-CM

## 2021-08-21 DIAGNOSIS — O09299 Supervision of pregnancy with other poor reproductive or obstetric history, unspecified trimester: Secondary | ICD-10-CM

## 2021-08-21 DIAGNOSIS — O24410 Gestational diabetes mellitus in pregnancy, diet controlled: Secondary | ICD-10-CM

## 2021-08-21 DIAGNOSIS — Z3480 Encounter for supervision of other normal pregnancy, unspecified trimester: Secondary | ICD-10-CM

## 2021-08-21 DIAGNOSIS — Z8632 Personal history of gestational diabetes: Secondary | ICD-10-CM

## 2021-08-21 DIAGNOSIS — O24419 Gestational diabetes mellitus in pregnancy, unspecified control: Secondary | ICD-10-CM | POA: Insufficient documentation

## 2021-08-21 NOTE — Progress Notes (Signed)
Please discuss VBAC- will sign consent today ?

## 2021-08-21 NOTE — Patient Instructions (Signed)

## 2021-08-21 NOTE — Progress Notes (Signed)
Subjective:  ?Brittany Williams is a 26 y.o. G3P2002 at [redacted]w[redacted]d being seen today for ongoing prenatal care.  She is currently monitored for the following issues for this high-risk pregnancy and has Previous cesarean delivery, antepartum; Supervision of other normal pregnancy, antepartum; Obesity in pregnancy, antepartum; and Gestational diabetes on their problem list. ? ?Patient reports general discomforts of pregnancy.  Contractions: Irregular. Vag. Bleeding: None.  Movement: Present. Denies leaking of fluid.  ? ?The following portions of the patient's history were reviewed and updated as appropriate: allergies, current medications, past family history, past medical history, past social history, past surgical history and problem list. Problem list updated. ? ?Objective:  ? ?Vitals:  ? 08/21/21 1429  ?BP: 115/71  ?Pulse: 82  ?Weight: 188 lb (85.3 kg)  ? ? ?Fetal Status: Fetal Heart Rate (bpm): 150   Movement: Present    ? ?General:  Alert, oriented and cooperative. Patient is in no acute distress.  ?Skin: Skin is warm and dry. No rash noted.   ?Cardiovascular: Normal heart rate noted  ?Respiratory: Normal respiratory effort, no problems with respiration noted  ?Abdomen: Soft, gravid, appropriate for gestational age. Pain/Pressure: Present     ?Pelvic:  Cervical exam deferred        ?Extremities: Normal range of motion.     ?Mental Status: Normal mood and affect. Normal behavior. Normal judgment and thought content.  ? ?Urinalysis:     ? ?Assessment and Plan:  ?Pregnancy: [id number] at [redacted]w[redacted]d ? ?1. Supervision of other normal pregnancy, antepartum ?Stable ?GBS next visit ? ?2. Previous cesarean delivery, antepartum ?VBAC consent today ? ?3. Obesity in pregnancy, antepartum ?Growth scan end of month ? ? ? ? ?4. Diet controlled gestational diabetes mellitus (GDM) in third trimester ?Pt reports CBG's in goal range ?Did not bring readings ?Pt instructed to bring CBG readings to all appt ?Glucose log provided to pt ?Growth scan  scheduled ? ?Preterm labor symptoms and general obstetric precautions including but not limited to vaginal bleeding, contractions, leaking of fluid and fetal movement were reviewed in detail with the patient. ?Please refer to After Visit Summary for other counseling recommendations.  ?Return in about 2 weeks (around 09/04/2021) for OB visit, face to face, MD only. ? ? ?Chancy Milroy, MD ?

## 2021-08-22 ENCOUNTER — Other Ambulatory Visit: Payer: Self-pay

## 2021-08-22 ENCOUNTER — Inpatient Hospital Stay (HOSPITAL_COMMUNITY)
Admission: AD | Admit: 2021-08-22 | Discharge: 2021-08-22 | Disposition: A | Discharge status: Home / Self Care | Payer: Medicaid Other | Attending: Obstetrics and Gynecology | Admitting: Obstetrics and Gynecology

## 2021-08-22 ENCOUNTER — Encounter (HOSPITAL_COMMUNITY): Payer: Self-pay | Admitting: Obstetrics and Gynecology

## 2021-08-22 DIAGNOSIS — B379 Candidiasis, unspecified: Secondary | ICD-10-CM | POA: Diagnosis not present

## 2021-08-22 DIAGNOSIS — Z0371 Encounter for suspected problem with amniotic cavity and membrane ruled out: Secondary | ICD-10-CM | POA: Insufficient documentation

## 2021-08-22 DIAGNOSIS — O98813 Other maternal infectious and parasitic diseases complicating pregnancy, third trimester: Secondary | ICD-10-CM | POA: Diagnosis present

## 2021-08-22 DIAGNOSIS — Z3480 Encounter for supervision of other normal pregnancy, unspecified trimester: Secondary | ICD-10-CM

## 2021-08-22 DIAGNOSIS — Z3A34 34 weeks gestation of pregnancy: Secondary | ICD-10-CM | POA: Insufficient documentation

## 2021-08-22 LAB — POCT FERN TEST: POCT Fern Test: NEGATIVE

## 2021-08-22 LAB — WET PREP, GENITAL
Clue Cells Wet Prep HPF POC: NONE SEEN
Sperm: NONE SEEN
Trich, Wet Prep: NONE SEEN
WBC, Wet Prep HPF POC: >=10 — AB (ref <10)
Yeast Wet Prep HPF POC: NONE SEEN

## 2021-08-22 MED ORDER — TERCONAZOLE 0.4 % VA CREA: 1.0000 | TOPICAL_CREAM | Freq: Every day | VAGINAL | 0 refills | Status: DC

## 2021-08-22 NOTE — MAU Note (Signed)
Brittany Williams is a 26 y.o. at [redacted]w[redacted]d here in MAU reporting: LOF since Friday, states fluid is clear white.  Denies VB.  Endorses +FM. ? ?Onset of complaint: Friday ?Pain score: 4/10 ?Vitals:  ? 08/22/21 1636  ?BP: 120/70  ?Pulse: 95  ?Resp: 20  ?Temp: 98.3 ?F (36.8 ?C)  ?SpO2: 98%  ?   ?FHT: 155 bpm ?Lab orders placed from triage:  UA  ?

## 2021-08-22 NOTE — MAU Provider Note (Signed)
Patient Brittany Williams is a 26 y.o. G3P2002 ? At [redacted]w[redacted]d here with complaints of vaginal discharge since Friday. She is concerned that her water is broken as she has some mucousy discharge that is clear when she sees it but then it turns white on her underwear. She denies decreased fetal movements., vaginal bleeding, contractions, fever, chest pain, SOB.  ?History  ?  ? ?CSN: [id number] ? ?Arrival date and time: 08/22/21 1619 ? ? Event Date/Time  ? First Provider Initiated Contact with Patient 08/22/21 1720   ?  ? ?Chief Complaint  ?Patient presents with  ? Rupture of Membranes  ? ?Vaginal Discharge ?The patient's primary symptoms include vaginal discharge. This is a new problem. The current episode started in the past 7 days. The problem occurs intermittently. The patient is experiencing no pain. Pertinent negatives include no abdominal pain, back pain, chills, dysuria, fever, urgency or vomiting.  ? ?OB History   ? ? Gravida  ?3  ? Para  ?2  ? Term  ?2  ? Preterm  ?   ? AB  ?   ? Living  ?2  ?  ? ? SAB  ?   ? IAB  ?   ? Ectopic  ?   ? Multiple  ?0  ? Live Births  ?2  ?   ?  ?  ? ? ?Past Medical History:  ?Diagnosis Date  ? Abnormal Pap smear   ? Anxiety   ? Asthma   ? Chlamydia   ? Gonorrhea   ? History of gestational diabetes in prior pregnancy, currently pregnant 06/01/2017  ? HgA1C at 9 weeks 5.2  ? Hypoglycemia   ? Ovarian cyst   ? ? ?Past Surgical History:  ?Procedure Laterality Date  ? APPENDECTOMY    ? CESAREAN SECTION N/A 08/20/2017  ? Procedure: CESAREAN SECTION;  Surgeon: Tereso Newcomer, MD;  Location: [facility];  Service: Obstetrics;  Laterality: N/A;  ? CYSTECTOMY    ? WISDOM TOOTH EXTRACTION    ? ? ?Family History  ?Problem Relation Age of Onset  ? Cancer Mother   ? Diabetes Maternal Grandmother   ? Diabetes Maternal Grandfather   ? Hypertension Other   ? Diabetes Other   ? Other Neg Hx   ? ? ?Social History  ? ?Tobacco Use  ? Smoking status: Former  ?  Packs/day: 0.50  ?  Types: Cigarettes  ?   Quit date: 09/23/2014  ?  Years since quitting: 6.9  ? Smokeless tobacco: Never  ?Vaping Use  ? Vaping Use: Never used  ?Substance Use Topics  ? Alcohol use: Not Currently  ?  Comment: not since confirmed pregnancy  ? Drug use: Not Currently  ?  Types: Marijuana  ?  Comment: not since confirmed pregnancy  ? ? ?Allergies:  ?Allergies  ?Allergen Reactions  ? Latex Itching and Rash  ? ? ?No medications prior to admission.  ? ? ?Review of Systems  ?Constitutional:  Negative for chills and fever.  ?Gastrointestinal:  Negative for abdominal pain and vomiting.  ?Genitourinary:  Positive for vaginal discharge. Negative for dysuria and urgency.  ?Musculoskeletal:  Negative for back pain.  ?Physical Exam  ? ?Blood pressure 118/61, pulse 85, temperature 98.3 ?F (36.8 ?C), temperature source Oral, resp. rate 20, height 5' (1.524 m), weight 84.8 kg, last menstrual period 12/21/2020, SpO2 98 %. ? ?Physical Exam ?Constitutional:   ?   Appearance: Normal appearance.  ?Cardiovascular:  ?   Rate and Rhythm:  Normal rate.  ?Pulmonary:  ?   Effort: Pulmonary effort is normal.  ?Abdominal:  ?   General: Abdomen is flat.  ?Genitourinary: ?   General: Normal vulva.  ?   Comments: NEFG; thick mucousy discharge, vaginal walls are red and irritated ?Skin: ?   General: Skin is warm.  ?   Capillary Refill: Capillary refill takes less than 2 seconds.  ?Neurological:  ?   General: No focal deficit present.  ?   Mental Status: She is alert.  ?Psychiatric:     ?   Mood and Affect: Mood normal.  ? ? ?MAU Course  ?Procedures ? ?MDM ?-fern and sterile fern negative ?-NST: 135 bpm, mod var, present acel, no decels, occasional ctx ? ? ?Assessment and Plan  ? ?1. Supervision of other normal pregnancy, antepartum   ?2. Yeast infection   ?3. [redacted] weeks gestation of pregnancy   ?-patient stable for discharge with instructions to take yeast infection cream ?-reviewed signs of LOF, labor precautions ? ?Charlesetta Garibaldi Tan Clopper ?08/22/2021, 7:34 PM  ?

## 2021-08-23 LAB — GC/CHLAMYDIA PROBE AMP (~~LOC~~) NOT AT ARMC
Chlamydia: NEGATIVE
Comment: NEGATIVE
Comment: NORMAL
Neisseria Gonorrhea: NEGATIVE

## 2021-09-05 ENCOUNTER — Encounter: Payer: Medicaid Other | Admitting: Obstetrics & Gynecology

## 2021-09-07 ENCOUNTER — Ambulatory Visit (INDEPENDENT_AMBULATORY_CARE_PROVIDER_SITE_OTHER): Payer: Medicaid Other | Admitting: Obstetrics & Gynecology

## 2021-09-07 VITALS — BP 119/74 | HR 87 | Wt 187.0 lb

## 2021-09-07 DIAGNOSIS — Z3480 Encounter for supervision of other normal pregnancy, unspecified trimester: Secondary | ICD-10-CM

## 2021-09-07 DIAGNOSIS — Z3A37 37 weeks gestation of pregnancy: Secondary | ICD-10-CM

## 2021-09-07 DIAGNOSIS — O24410 Gestational diabetes mellitus in pregnancy, diet controlled: Secondary | ICD-10-CM

## 2021-09-07 DIAGNOSIS — O34219 Maternal care for unspecified type scar from previous cesarean delivery: Secondary | ICD-10-CM

## 2021-09-07 NOTE — Addendum Note (Signed)
Addended by: Jearld Adjutant on: 09/07/2021 03:21 PM   Modules accepted: Orders

## 2021-09-07 NOTE — Progress Notes (Signed)
   PRENATAL VISIT NOTE  Subjective:  Brittany Williams is a 26 y.o. G3P2002 at [redacted]w[redacted]d being seen today for ongoing prenatal care.  She is currently monitored for the following issues for this high-risk pregnancy and has Previous cesarean delivery, antepartum; Supervision of other normal pregnancy, antepartum; Obesity in pregnancy, antepartum; and Gestational diabetes on their problem list.  Patient reports occasional contractions.  Contractions: Irregular. Vag. Bleeding: None.  Movement: Present. Denies leaking of fluid.   The following portions of the patient's history were reviewed and updated as appropriate: allergies, current medications, past family history, past medical history, past social history, past surgical history and problem list.   Objective:   Vitals:   09/07/21 1404  BP: 119/74  Pulse: 87  Weight: 187 lb (84.8 kg)    Fetal Status: Fetal Heart Rate (bpm): 128   Movement: Present  Presentation: Vertex  General:  Alert, oriented and cooperative. Patient is in no acute distress.  Skin: Skin is warm and dry. No rash noted.   Cardiovascular: Normal heart rate noted  Respiratory: Normal respiratory effort, no problems with respiration noted  Abdomen: Soft, gravid, appropriate for gestational age.  Pain/Pressure: Present     Pelvic: Cervical exam performed in the presence of a chaperone Dilation: 3.5 Effacement (%): 50 Station: -3  Extremities: Normal range of motion.  Edema: Trace  Mental Status: Normal mood and affect. Normal behavior. Normal judgment and thought content.   Assessment and Plan:  Pregnancy: G3P2002 at [redacted]w[redacted]d 1. Diet controlled gestational diabetes mellitus (GDM) in third trimester FBS<90 PP<120  2. Supervision of other normal pregnancy, antepartum F/u growth [place] 1 week  3. Previous cesarean delivery, antepartum Plans TOLAC  Term labor symptoms and general obstetric precautions including but not limited to vaginal bleeding, contractions, leaking of fluid  and fetal movement were reviewed in detail with the patient. Please refer to After Visit Summary for other counseling recommendations.   Return in about 1 week (around 09/14/2021).  Future Appointments  Date Time Provider [place]  09/12/2021  3:15 PM [facility]URSE [facility]  09/12/2021  3:30 PM [facility] US2 [facility]    Emeterio Reeve, MD

## 2021-09-07 NOTE — Progress Notes (Signed)
ROB, she passed her mucous plug this Morning, cramping 1/10 x 1 day.

## 2021-09-11 LAB — CULTURE, BETA STREP (GROUP B ONLY): Strep Gp B Culture: NEGATIVE

## 2021-09-12 ENCOUNTER — Ambulatory Visit: Payer: Medicaid Other | Admitting: *Deleted

## 2021-09-12 ENCOUNTER — Ambulatory Visit: Payer: Medicaid Other | Attending: Obstetrics and Gynecology

## 2021-09-12 VITALS — BP 118/73 | HR 97

## 2021-09-12 DIAGNOSIS — Z3480 Encounter for supervision of other normal pregnancy, unspecified trimester: Secondary | ICD-10-CM | POA: Insufficient documentation

## 2021-09-12 DIAGNOSIS — Z362 Encounter for other antenatal screening follow-up: Secondary | ICD-10-CM | POA: Insufficient documentation

## 2021-09-12 DIAGNOSIS — O09293 Supervision of pregnancy with other poor reproductive or obstetric history, third trimester: Secondary | ICD-10-CM

## 2021-09-12 DIAGNOSIS — E669 Obesity, unspecified: Secondary | ICD-10-CM

## 2021-09-12 DIAGNOSIS — O99213 Obesity complicating pregnancy, third trimester: Secondary | ICD-10-CM | POA: Diagnosis not present

## 2021-09-12 DIAGNOSIS — O24410 Gestational diabetes mellitus in pregnancy, diet controlled: Secondary | ICD-10-CM | POA: Diagnosis not present

## 2021-09-12 DIAGNOSIS — Z3A37 37 weeks gestation of pregnancy: Secondary | ICD-10-CM | POA: Diagnosis not present

## 2021-09-12 DIAGNOSIS — O24013 Pre-existing diabetes mellitus, type 1, in pregnancy, third trimester: Secondary | ICD-10-CM | POA: Insufficient documentation

## 2021-09-12 DIAGNOSIS — O34219 Maternal care for unspecified type scar from previous cesarean delivery: Secondary | ICD-10-CM | POA: Diagnosis not present

## 2021-09-13 ENCOUNTER — Inpatient Hospital Stay (HOSPITAL_COMMUNITY)
Admission: AD | Admit: 2021-09-13 | Discharge: 2021-09-13 | Disposition: A | Discharge status: Home / Self Care | Payer: Medicaid Other | Attending: Obstetrics & Gynecology | Admitting: Obstetrics & Gynecology

## 2021-09-13 ENCOUNTER — Encounter (HOSPITAL_COMMUNITY): Payer: Self-pay | Admitting: Obstetrics & Gynecology

## 2021-09-13 DIAGNOSIS — O471 False labor at or after 37 completed weeks of gestation: Secondary | ICD-10-CM

## 2021-09-13 DIAGNOSIS — O4693 Antepartum hemorrhage, unspecified, third trimester: Secondary | ICD-10-CM | POA: Diagnosis present

## 2021-09-13 DIAGNOSIS — O26893 Other specified pregnancy related conditions, third trimester: Secondary | ICD-10-CM

## 2021-09-13 DIAGNOSIS — N898 Other specified noninflammatory disorders of vagina: Secondary | ICD-10-CM

## 2021-09-13 DIAGNOSIS — Z3A38 38 weeks gestation of pregnancy: Secondary | ICD-10-CM | POA: Diagnosis not present

## 2021-09-13 HISTORY — DX: Gestational diabetes mellitus in pregnancy, unspecified control: O24.419

## 2021-09-13 LAB — POCT FERN TEST: POCT Fern Test: NEGATIVE

## 2021-09-13 NOTE — MAU Provider Note (Signed)
Event Date/Time   First Provider Initiated Contact with Patient 09/13/21 [phone]      S: [name] is a 26 y.o. G3P2002 at [redacted]w[redacted]d  who presents to MAU today complaining of irregular contractions since last night. She endorses vaginal bleeding, scant dried blood. She endorses LOF. She reports normal fetal movement.  She was checked in the office last week and was 3.5 cm.   O: BP 110/75 (BP Location: Right Arm)   Pulse (!) 105   Temp 98.2 F (36.8 C) (Oral)   Resp 18   Ht 5\' 1"  (1.549 m)   Wt 85.1 kg   LMP 12/21/2020 (Exact Date)   SpO2 98%   BMI 35.45 kg/m  GENERAL: Well-developed, well-nourished female in no acute distress.  HEAD: Normocephalic, atraumatic.  CHEST: Normal effort of breathing, tachycardia ABDOMEN: Soft, nontender, gravid  Cervical exam:  Dilation: 3.5 Cervical Position: Posterior Presentation: Vertex Exam by:: [email or web address], CNM.   Fetal Monitoring: Baseline: 150 bpm Variability: moderate Accelerations: 15 x 15 and 10 x 10 Decelerations: none Contractions: few, irregular with UI  Results for orders placed or performed during the hospital encounter of 09/13/21 (from the past 24 hour(s))  Fern Test     Status: None   Collection Time: 09/13/21 10:05 AM  Result Value Ref Range   POCT Fern Test Negative = intact amniotic membranes     A: SIUP at [redacted]w[redacted]d  False labor Vaginal discharge in pregnancy, third trimester   P: Discharge home Labor precautions discussed Patient advised to follow-up with [facility] as scheduled on Friday  Patient may return to MAU as needed or if her condition were to change or worsen   Sunday, PA-C 09/13/2021 10:35 AM

## 2021-09-13 NOTE — H&P (Cosign Needed)
History    Brittany Williams is a 26year old G34P2002 woman with a hx of c-section who presents to MAU for vaginal bleeding, pelvic pressure, and irregular contractions. She states she has observed passing mucous and reports she was 3.5cm at her last prenatal visit on Thursday.   From chart review she has been followed this pregnancy for GDM (managed by diet) and has had normal U/S q4weeks starting at [redacted]w[redacted]d She had one episode of vaginal bleeding at 146w1d following pap.   CSN: [id number] date and time: 09/13/21 0906   Event Date/Time   First Provider Initiated Contact with Patient 09/13/21 [phone]    Chief Complaint  Patient presents with   Pelvic Pain   Vaginal Bleeding   Rupture of Membranes   Pelvic Pain The patient's primary symptoms include pelvic pain.  Vaginal Bleeding The patient's primary symptoms include pelvic pain.   OB History     Gravida  3   Para  2   Term  2   Preterm      AB      Living  2      SAB      IAB      Ectopic      Multiple  0   Live Births  2           Past Medical History:  Diagnosis Date   Abnormal Pap smear    Anxiety    Asthma    Chlamydia    Gestational diabetes    Gonorrhea    History of gestational diabetes in prior pregnancy, currently pregnant 06/01/2017   HgA1C at 9 weeks 5.2   Hypoglycemia    Ovarian cyst     Past Surgical History:  Procedure Laterality Date   APPENDECTOMY     CESAREAN SECTION N/A 08/20/2017   Procedure: CESAREAN SECTION;  Surgeon: AnOsborne OmanMD;  [facility] Service: Obstetrics;  Laterality: N/A;   CYSTECTOMY     WISDOM TOOTH EXTRACTION      Family History  Problem Relation Age of Onset   Cancer Mother    Diabetes Maternal Grandmother    Diabetes Maternal Grandfather    Hypertension Other    Diabetes Other    Other Neg Hx     Social History   Tobacco Use   Smoking status: Former    Packs/day: 0.50    Types: Cigarettes    Quit date: 09/23/2014     Years since quitting: 6.9   Smokeless tobacco: Never  Vaping Use   Vaping Use: Never used  Substance Use Topics   Alcohol use: Not Currently    Comment: not since confirmed pregnancy   Drug use: Not Currently    Types: Marijuana    Comment: not since confirmed pregnancy    Allergies:  Allergies  Allergen Reactions   Latex Itching and Rash    Medications Prior to Admission  Medication Sig Dispense Refill Last Dose   aspirin EC 81 MG tablet Take 1 tablet (81 mg total) by mouth daily. Take after 12 weeks for prevention of preeclampsia later in pregnancy 300 tablet 2 09/12/2021   Prenatal Vit-Fe Fumarate-FA (MULTIVITAMIN-PRENATAL) 27-0.8 MG TABS tablet Take 1 tablet by mouth daily at 12 noon. 30 tablet 12 09/12/2021   Accu-Chek Softclix Lancets lancets Check glucose 4 times daily 100 each 12    Blood Glucose Monitoring Suppl (ACCU-CHEK GUIDE) w/Device KIT 1 Device by Does not apply route in the morning,  at noon, in the evening, and at bedtime. 1 kit 0    glucose blood (ACCU-CHEK GUIDE) test strip Use strips, check glucose 4 times daily 100 each 12     Review of Systems  Genitourinary:  Positive for pelvic pain and vaginal bleeding.  Physical Exam   Blood pressure 104/62, pulse (!) 119, temperature 98.2 F (36.8 C), temperature source Oral, resp. rate 17, height _0  (1.549 m), weight 85.1 kg, last menstrual period 12/21/2020, SpO2 98 %.  Physical Exam Vitals and nursing note reviewed. Exam conducted with a chaperone present.  Constitutional:      Appearance: Normal appearance.  HENT:     Head: Normocephalic and atraumatic.  Pulmonary:     Effort: Pulmonary effort is normal.  Genitourinary:    General: Normal vulva.     Vagina: Vaginal discharge present.     Comments: No vaginal bleeding Musculoskeletal:        General: No swelling.  Skin:    General: Skin is warm and dry.  Neurological:     Mental Status: She is alert and oriented to person, place, and time.    MAU  Course  Procedures  MDM -Fern test  -Cervical check  -Fetal monitoring  Assessment and Plan  Vaginal Bleeding No vaginal bleeding seen on speculum exam. Fern test negative. Cervix check at 3cm, unchanged from prenatal cervical check on Thursday. Fetal monitoring was reassuring. Unlikely to be uterine rupture or placental abruption.  -Discharge home with labor and bleeding precautions.    Spero Curb 09/13/2021, 10:14 AM

## 2021-09-13 NOTE — MAU Note (Signed)
Brittany Williams is a 26 y.o. at [redacted]w[redacted]d here in MAU reporting: has been losing mucous plug over last few days, today there was no mucous- just "dried blood", brownish.  Has been having pelvic pain, very difficult to walk, stand up. Discussed with dr and they checked her cx. - was 3.5 last Thurs.  Noted an increase in pelvic pressure, 'feels babie's head on her rectum'.  Ctx's are irregular. Been leaking a lot of water, not d/c- but fluid.  Doesn't know what it is, first noted about a wk ago.  Had [place] yesterday, was told everything was fine. Reports +FM Onset of complaint: ongoing, worsening Pain score: 9/5 Vitals:   09/13/21 0923  BP: 122/79  Pulse: (!) 110  Resp: 18  Temp: 98.2 F (36.8 C)  SpO2: 100%     FHT:146 Lab orders placed from triage:  none

## 2021-09-15 ENCOUNTER — Encounter: Payer: Self-pay | Admitting: Obstetrics

## 2021-09-15 ENCOUNTER — Ambulatory Visit (INDEPENDENT_AMBULATORY_CARE_PROVIDER_SITE_OTHER): Payer: Medicaid Other | Admitting: Obstetrics

## 2021-09-15 VITALS — BP 123/76 | HR 90 | Wt 188.2 lb

## 2021-09-15 DIAGNOSIS — Z3480 Encounter for supervision of other normal pregnancy, unspecified trimester: Secondary | ICD-10-CM

## 2021-09-15 DIAGNOSIS — O99210 Obesity complicating pregnancy, unspecified trimester: Secondary | ICD-10-CM

## 2021-09-15 DIAGNOSIS — O34219 Maternal care for unspecified type scar from previous cesarean delivery: Secondary | ICD-10-CM

## 2021-09-15 NOTE — Progress Notes (Signed)
Pt presents for ROB without complaints today.  

## 2021-09-15 NOTE — Progress Notes (Signed)
Subjective:  Brittany Williams is a 26 y.o. G3P2002 at [redacted]w[redacted]d being seen today for ongoing prenatal care.  She is currently monitored for the following issues for this low-risk pregnancy and has Previous cesarean delivery, antepartum; Supervision of other normal pregnancy, antepartum; Obesity in pregnancy, antepartum; and Gestational diabetes on their problem list.  Patient reports heartburn.  Contractions: Irritability. Vag. Bleeding: None.  Movement: Present. Denies leaking of fluid.   The following portions of the patient's history were reviewed and updated as appropriate: allergies, current medications, past family history, past medical history, past social history, past surgical history and problem list. Problem list updated.  Objective:   Vitals:   09/15/21 0900  BP: 123/76  Pulse: 90  Weight: 188 lb 3.2 oz (85.4 kg)    Fetal Status: Fetal Heart Rate (bpm): 132   Movement: Present     General:  Alert, oriented and cooperative. Patient is in no acute distress.  Skin: Skin is warm and dry. No rash noted.   Cardiovascular: Normal heart rate noted  Respiratory: Normal respiratory effort, no problems with respiration noted  Abdomen: Soft, gravid, appropriate for gestational age. Pain/Pressure: Present     Pelvic:  Cervical exam deferred        Extremities: Normal range of motion.  Edema: Trace  Mental Status: Normal mood and affect. Normal behavior. Normal judgment and thought content.   Urinalysis:      Assessment and Plan:  Pregnancy: G3P2002 at [redacted]w[redacted]d  1. Supervision of high risk pregnancy, antepartum  2. Previous cesarean delivery, antepartum - VBAC consented  3. Obesity in pregnancy, antepartum    Term labor symptoms and general obstetric precautions including but not limited to vaginal bleeding, contractions, leaking of fluid and fetal movement were reviewed in detail with the patient. Please refer to After Visit Summary for other counseling recommendations.   Return in  about 1 week (around 09/22/2021) for [place].   Shelly Bombard, MD  09/15/21

## 2021-09-17 ENCOUNTER — Other Ambulatory Visit: Payer: Self-pay

## 2021-09-17 ENCOUNTER — Inpatient Hospital Stay (HOSPITAL_COMMUNITY)
Admission: AD | Admit: 2021-09-17 | Discharge: 2021-09-18 | DRG: 807 | Disposition: A | Discharge status: Home / Self Care | Payer: Medicaid Other | Attending: Obstetrics & Gynecology | Admitting: Obstetrics & Gynecology

## 2021-09-17 ENCOUNTER — Encounter (HOSPITAL_COMMUNITY): Payer: Self-pay | Admitting: Obstetrics & Gynecology

## 2021-09-17 DIAGNOSIS — O99214 Obesity complicating childbirth: Secondary | ICD-10-CM | POA: Diagnosis present

## 2021-09-17 DIAGNOSIS — Z87891 Personal history of nicotine dependence: Secondary | ICD-10-CM

## 2021-09-17 DIAGNOSIS — O24420 Gestational diabetes mellitus in childbirth, diet controlled: Secondary | ICD-10-CM | POA: Diagnosis present

## 2021-09-17 DIAGNOSIS — Z3A38 38 weeks gestation of pregnancy: Secondary | ICD-10-CM

## 2021-09-17 DIAGNOSIS — O34211 Maternal care for low transverse scar from previous cesarean delivery: Secondary | ICD-10-CM | POA: Diagnosis not present

## 2021-09-17 DIAGNOSIS — O34219 Maternal care for unspecified type scar from previous cesarean delivery: Principal | ICD-10-CM | POA: Diagnosis not present

## 2021-09-17 LAB — CBC
HCT: 40.4 % (ref 36.0–46.0)
Hemoglobin: 14 g/dL (ref 12.0–15.0)
MCH: 30 pg (ref 26.0–34.0)
MCHC: 34.7 g/dL (ref 30.0–36.0)
MCV: 86.7 fL (ref 80.0–100.0)
Platelets: 174 10*3/uL (ref 150–400)
RBC: 4.66 MIL/uL (ref 3.87–5.11)
RDW: 15.1 % (ref 11.5–15.5)
WBC: 12.3 10*3/uL — ABNORMAL HIGH (ref 4.0–10.5)
nRBC: 0 % (ref 0.0–0.2)

## 2021-09-17 LAB — TYPE AND SCREEN
ABO/RH(D): O POS
Antibody Screen: NEGATIVE

## 2021-09-17 LAB — RPR: RPR Ser Ql: NONREACTIVE

## 2021-09-17 MED ORDER — SIMETHICONE 80 MG PO CHEW: 80.0000 mg | CHEWABLE_TABLET | ORAL | Status: DC | PRN

## 2021-09-17 MED ORDER — OXYCODONE-ACETAMINOPHEN 5-325 MG PO TABS: 1.0000 | ORAL_TABLET | ORAL | Status: DC | PRN

## 2021-09-17 MED ORDER — LACTATED RINGERS IV SOLN: 500.0000 mL | INTRAVENOUS | Status: DC | PRN

## 2021-09-17 MED ORDER — COCONUT OIL OIL
1.0000 "application " | TOPICAL_OIL | Status: DC | PRN
  Administered 2021-09-18: 1 via TOPICAL

## 2021-09-17 MED ORDER — OXYTOCIN-SODIUM CHLORIDE 30-0.9 UT/500ML-% IV SOLN
2.5000 [IU]/h | INTRAVENOUS | Status: DC
  Filled 2021-09-17: qty 500

## 2021-09-17 MED ORDER — ACETAMINOPHEN 325 MG PO TABS
650.0000 mg | ORAL_TABLET | ORAL | Status: DC | PRN
  Filled 2021-09-17: qty 2

## 2021-09-17 MED ORDER — LEVONORGESTREL 20 MCG/DAY IU IUD: 1.0000 | INTRAUTERINE_SYSTEM | Freq: Once | INTRAUTERINE | Status: DC

## 2021-09-17 MED ORDER — ONDANSETRON HCL 4 MG/2ML IJ SOLN: 4.0000 mg | INTRAMUSCULAR | Status: DC | PRN

## 2021-09-17 MED ORDER — LIDOCAINE HCL (PF) 1 % IJ SOLN: 30.0000 mL | INTRAMUSCULAR | Status: DC | PRN

## 2021-09-17 MED ORDER — DIBUCAINE (PERIANAL) 1 % EX OINT: 1.0000 "application " | TOPICAL_OINTMENT | CUTANEOUS | Status: DC | PRN

## 2021-09-17 MED ORDER — ACETAMINOPHEN 325 MG PO TABS: 650.0000 mg | ORAL_TABLET | ORAL | Status: DC | PRN

## 2021-09-17 MED ORDER — IBUPROFEN 600 MG PO TABS
600.0000 mg | ORAL_TABLET | Freq: Four times a day (QID) | ORAL | Status: DC
  Filled 2021-09-17 (×2): qty 1

## 2021-09-17 MED ORDER — ONDANSETRON HCL 4 MG/2ML IJ SOLN
4.0000 mg | Freq: Four times a day (QID) | INTRAMUSCULAR | Status: DC | PRN
Start: 2021-09-17 — End: 2021-09-17

## 2021-09-17 MED ORDER — DIPHENHYDRAMINE HCL 25 MG PO CAPS: 25.0000 mg | ORAL_CAPSULE | Freq: Four times a day (QID) | ORAL | Status: DC | PRN

## 2021-09-17 MED ORDER — OXYTOCIN BOLUS FROM INFUSION
333.0000 mL | Freq: Once | INTRAVENOUS | Status: AC
  Administered 2021-09-17: 333 mL via INTRAVENOUS

## 2021-09-17 MED ORDER — SOD CITRATE-CITRIC ACID 500-334 MG/5ML PO SOLN: 30.0000 mL | ORAL | Status: DC | PRN

## 2021-09-17 MED ORDER — BENZOCAINE-MENTHOL 20-0.5 % EX AERO
1.0000 "application " | INHALATION_SPRAY | CUTANEOUS | Status: DC | PRN
  Administered 2021-09-17: 1 via TOPICAL
  Filled 2021-09-17: qty 56

## 2021-09-17 MED ORDER — WITCH HAZEL-GLYCERIN EX PADS: 1.0000 "application " | MEDICATED_PAD | CUTANEOUS | Status: DC | PRN

## 2021-09-17 MED ORDER — PRENATAL MULTIVITAMIN CH
1.0000 | ORAL_TABLET | Freq: Every day | ORAL | Status: DC
  Administered 2021-09-17 – 2021-09-18 (×2): 1 via ORAL
  Filled 2021-09-17 (×2): qty 1

## 2021-09-17 MED ORDER — SENNOSIDES-DOCUSATE SODIUM 8.6-50 MG PO TABS
2.0000 | ORAL_TABLET | ORAL | Status: DC
  Administered 2021-09-17 – 2021-09-18 (×2): 2 via ORAL
  Filled 2021-09-17 (×2): qty 2

## 2021-09-17 MED ORDER — MEASLES, MUMPS & RUBELLA VAC IJ SOLR: 0.5000 mL | Freq: Once | INTRAMUSCULAR | Status: DC

## 2021-09-17 MED ORDER — LACTATED RINGERS IV SOLN: INTRAVENOUS | Status: DC

## 2021-09-17 MED ORDER — ONDANSETRON HCL 4 MG PO TABS: 4.0000 mg | ORAL_TABLET | ORAL | Status: DC | PRN

## 2021-09-17 MED ORDER — TETANUS-DIPHTH-ACELL PERTUSSIS 5-2.5-18.5 LF-MCG/0.5 IM SUSY: 0.5000 mL | PREFILLED_SYRINGE | Freq: Once | INTRAMUSCULAR | Status: DC

## 2021-09-17 MED ORDER — OXYCODONE-ACETAMINOPHEN 5-325 MG PO TABS: 2.0000 | ORAL_TABLET | ORAL | Status: DC | PRN

## 2021-09-17 NOTE — Discharge Summary (Signed)
Postpartum Discharge Summary  Date of Service updated-yes     Patient Name: Brittany Williams DOB: 1995-05-01 MRN: [id number]  Date of admission: 09/17/2021 Delivery date:09/17/2021  Delivering provider: Julianne Handler  Date of discharge: 09/18/2021  Admitting diagnosis: Indication for care in labor and delivery, antepartum [O75.9] Intrauterine pregnancy: [redacted]w[redacted]d    Secondary diagnosis:  Principal Problem:   Indication for care in labor and delivery, antepartum Active Problems:   VBAC (vaginal birth after Cesarean)  Additional problems: previous CS, obesity, A1GDM    Discharge diagnosis: Term Pregnancy Delivered, VBAC, and GDM A1                                              Post partum procedures: none Augmentation: AROM Complications: None  Hospital course: Onset of Labor With Vaginal Delivery      26y.o. yo G3P2002 at 320w4das admitted in Active Labor on 09/17/2021. Patient had an uncomplicated labor course as follows:  Membrane Rupture Time/Date: 9:42 AM ,09/17/2021   Delivery Method:Vaginal, Spontaneous  Episiotomy: None  Lacerations:  None  Patient had an uncomplicated postpartum course.  She is ambulating, tolerating a regular diet, passing flatus, and urinating well. She originally wanted Nexplanon placed IP but did not want to wait for provider on day of discharge therefore she will have it placed at pp visit. Patient is discharged home in stable condition on 09/18/21.  Newborn Data: Birth date:09/17/2021  Birth time:9:55 AM  Gender:Female  Living status:Living  Apgars:8 ,9  Weight:3190 g   Magnesium Sulfate received: No BMZ received: No Rhophylac:N/A MMR:N/A T-DaP:Given prenatally Flu: Yes Transfusion:No  Physical exam  Vitals:   09/17/21 1600 09/17/21 2010 09/18/21 0543 09/18/21 1313  BP: 104/60 110/64 96/60 111/81  Pulse: 81 75 79 70  Resp: 16 18 18 16   Temp: 98 F (36.7 C) 98.8 F (37.1 C) 97.8 F (36.6 C) 98 F (36.7 C)  TempSrc: Oral Oral Oral Oral   SpO2: 97% 97% 98% 97%   General: alert, cooperative, and no distress Lochia: appropriate Uterine Fundus: firm Incision: N/A DVT Evaluation: No evidence of DVT seen on physical exam. Negative Homan's sign. No cords or calf tenderness. No significant calf/ankle edema. Labs: Lab Results  Component Value Date   WBC 12.3 (H) 09/17/2021   HGB 14.0 09/17/2021   HCT 40.4 09/17/2021   MCV 86.7 09/17/2021   PLT 174 09/17/2021      Latest Ref Rng & Units 03/01/2021    3:46 PM  CMP  Glucose 70 - 99 mg/dL 107    BUN 6 - 20 mg/dL 8    Creatinine 0.57 - 1.00 mg/dL 0.59    Sodium 134 - 144 mmol/L 136    Potassium 3.5 - 5.2 mmol/L 4.0    Chloride 96 - 106 mmol/L 101    CO2 20 - 29 mmol/L 19    Calcium 8.7 - 10.2 mg/dL 9.6    Total Protein 6.0 - 8.5 g/dL 6.8    Total Bilirubin 0.0 - 1.2 mg/dL <0.2    Alkaline Phos 44 - 121 IU/L 60    AST 0 - 40 IU/L 14    ALT 0 - 32 IU/L 19     Edinburgh Score:    09/18/2021    9:18 AM  Edinburgh Postnatal Depression Scale Screening Tool  I have been able to  laugh and see the funny side of things. 0  I have looked forward with enjoyment to things. 0  I have blamed myself unnecessarily when things went wrong. 0  I have been anxious or worried for no good reason. 1  I have felt scared or panicky for no good reason. 0  Things have been getting on top of me. 0  I have been so unhappy that I have had difficulty sleeping. 0  I have felt sad or miserable. 0  I have been so unhappy that I have been crying. 0  The thought of harming myself has occurred to me. 0  Edinburgh Postnatal Depression Scale Total 1     After visit meds:  Allergies as of 09/18/2021       Reactions   Latex Itching, Rash        Medication List     STOP taking these medications    Accu-Chek Guide test strip Generic drug: glucose blood   Accu-Chek Guide w/Device Kit   Accu-Chek Softclix Lancets lancets   aspirin EC 81 MG tablet       TAKE these medications     multivitamin-prenatal 27-0.8 MG Tabs tablet Take 1 tablet by mouth daily at 12 noon.         Discharge home in stable condition Infant Feeding: Breast Infant Disposition:home with mother Discharge instruction: per After Visit Summary and Postpartum booklet. Activity: Advance as tolerated. Pelvic rest for 6 weeks.  Diet: routine diet Future Appointments: Future Appointments  Date Time Provider [place]  10/31/2021  1:10 PM Shelly Bombard, MD [place] None   Follow up Visit:  Follow-up Information     [facility]. Schedule an appointment as soon as possible for a visit in 6 week(s).   Contact information: [address] [place] [phone]                 Please schedule this patient for a In person postpartum visit in 6 weeks with the following provider: Any provider. Additional Postpartum F/U:2 hour GTT  Low risk pregnancy complicated by: GDM Delivery mode:  Vaginal, Spontaneous  Anticipated Birth Control:  Nexplanon   09/18/2021 Julianne Handler, CNM

## 2021-09-17 NOTE — Lactation Note (Signed)
This note was copied from a baby's chart. Lactation Consultation Note  Patient Name: Brittany Williams [id number]'E Date: 09/17/2021 Reason for consult: Initial assessment;Early term 37-38.6wks;Breastfeeding assistance Age:28 hours  P3, Early Term, Infant Female  LC entered the room and mom was breastfeeding baby. Per mom, the latch is comfortable. LC observed the latch and baby latched deeply in the cross cradle hold on the right breast. Baby's lips were flanged and swallows were noted. Mom denies any pain.   Per mom she breast fed one of her children for a few weeks and the other for 1 year. Mom states that she has been taught how to hand express and feel comfortable with expressing milk.   Mom says that she has a Motif breast pump in her bag.   LC reviewed lactation services brochure.   Mom states that she would like to see lactation PRN.   Mom is aware of outpatient services and will call lactation if needed.   Maternal Data Has patient been taught Hand Expression?: Yes Does the patient have breastfeeding experience prior to this delivery?: Yes How long did the patient breastfeed?: Per mom she breast fed for a few weeks and 1 year.  Feeding Mother's Current Feeding Choice: Breast Milk  LATCH Score Latch: Grasps breast easily, tongue down, lips flanged, rhythmical sucking.  Audible Swallowing: Spontaneous and intermittent  Type of Nipple: Everted at rest and after stimulation  Comfort (Breast/Nipple): Soft / non-tender  Hold (Positioning): No assistance needed to correctly position infant at breast.  LATCH Score: 10   Lactation Tools Discussed/Used    Interventions Interventions: LC Services brochure;Education  Discharge Pump: DEBP;Personal (Per mom she has a Motif breast pump.)  Consult Status Consult Status: PRN    Orvil Feil Kirin Brandenburger 09/17/2021, 4:07 PM

## 2021-09-17 NOTE — H&P (Signed)
OBSTETRIC ADMISSION HISTORY AND PHYSICAL  Brittany Williams is a 26 y.o. female G3P2002 with IUP at 73w4dpresenting for TOLAC. She reports +FMs. No LOF, VB, blurry vision, headaches, peripheral edema, or RUQ pain. She plans on breastfeeding. She requests ppIUD for birth control.  Dating: By LMP --->  Estimated Date of Delivery: 09/27/21  Sono:    _0 , normal anatomy, ceph presentation, 3589g, 83%ile, EFW 7'15   Prenatal History/Complications: -previous CS -A1GDM -obesity  Past Medical History: Past Medical History:  Diagnosis Date   Abnormal Pap smear    Anxiety    Asthma    Chlamydia    Gestational diabetes    Gonorrhea    History of gestational diabetes in prior pregnancy, currently pregnant 06/01/2017   HgA1C at 9 weeks 5.2   Hypoglycemia    Ovarian cyst     Past Surgical History: Past Surgical History:  Procedure Laterality Date   APPENDECTOMY     CESAREAN SECTION N/A 08/20/2017   Procedure: CESAREAN SECTION;  Surgeon: AOsborne Oman MD;  [facility]  Service: Obstetrics;  Laterality: N/A;   CYSTECTOMY     WISDOM TOOTH EXTRACTION      Obstetrical History: OB History     Gravida  3   Para  2   Term  2   Preterm      AB      Living  2      SAB      IAB      Ectopic      Multiple  0   Live Births  2           Social History: Social History   Socioeconomic History   Marital status: Single    Spouse name: Not on file   Number of children: Not on file   Years of education: Not on file   Highest education level: Not on file  Occupational History   Not on file  Tobacco Use   Smoking status: Former    Packs/day: 0.50    Types: Cigarettes    Quit date: 09/23/2014    Years since quitting: 6.9   Smokeless tobacco: Never  Vaping Use   Vaping Use: Never used  Substance and Sexual Activity   Alcohol use: Not Currently    Comment: not since confirmed pregnancy   Drug use: Not Currently    Types: Marijuana     Comment: not since confirmed pregnancy   Sexual activity: Yes    Partners: Male    Birth control/protection: None  Other Topics Concern   Not on file  Social History Narrative   Not on file   Social Determinants of Health   Financial Resource Strain: Not on file  Food Insecurity: Not on file  Transportation Needs: Not on file  Physical Activity: Not on file  Stress: Not on file  Social Connections: Not on file    Family History: Family History  Problem Relation Age of Onset   Cancer Mother    Diabetes Maternal Grandmother    Diabetes Maternal Grandfather    Hypertension Other    Diabetes Other    Other Neg Hx     Allergies: Allergies  Allergen Reactions   Latex Itching and Rash    Medications Prior to Admission  Medication Sig Dispense Refill Last Dose   Accu-Chek Softclix Lancets lancets Check glucose 4 times daily 100 each 12    aspirin EC 81 MG tablet Take 1 tablet (81 mg  total) by mouth daily. Take after 12 weeks for prevention of preeclampsia later in pregnancy 300 tablet 2    Blood Glucose Monitoring Suppl (ACCU-CHEK GUIDE) w/Device KIT 1 Device by Does not apply route in the morning, at noon, in the evening, and at bedtime. 1 kit 0    glucose blood (ACCU-CHEK GUIDE) test strip Use strips, check glucose 4 times daily 100 each 12    Prenatal Vit-Fe Fumarate-FA (MULTIVITAMIN-PRENATAL) 27-0.8 MG TABS tablet Take 1 tablet by mouth daily at 12 noon. 30 tablet 12      Review of Systems:  All systems reviewed and negative except as stated in HPI  PE: Last menstrual period 12/21/2020. General appearance: alert, cooperative, and moderate distress Lungs: regular rate and effort Heart: regular rate  Abdomen: soft, non-tender Extremities: Homans sign is negative, no sign of DVT Presentation: cephalic EFM: 776 bpm, mod variability, + accels, variable decels Toco: 2-3 SVE: 8.5cm, BBOW by S.Rollene Rotunda, CNM  Prenatal labs: ABO, Rh: O/Positive/-- (11/16 1546) Antibody:  Negative (11/16 1546) Rubella: 4.46 (11/16 1546) RPR: Non Reactive (03/29 1045)  HBsAg: Negative (11/16 1546)  HIV: Non Reactive (03/29 1045)  GBS: Negative/-- (05/25 1523)  2 hr GTT 78/190/157  Prenatal Transfer Tool  Maternal Diabetes: Yes:  Diabetes Type:  Diet controlled Genetic Screening: Normal Maternal Ultrasounds/Referrals: Normal Fetal Ultrasounds or other Referrals:  None Maternal Substance Abuse:  No Significant Maternal Medications:  None Significant Maternal Lab Results: Group B Strep negative  No results found for this or any previous visit (from the past 24 hour(s)).  Patient Active Problem List   Diagnosis Date Noted   Indication for care in labor and delivery, antepartum 09/17/2021   Gestational diabetes 08/21/2021   Obesity in pregnancy, antepartum 03/01/2021   Supervision of other normal pregnancy, antepartum 02/15/2021   Previous cesarean delivery, antepartum 08/20/2017    Assessment: Brittany Williams is a 26 y.o. G3P2002 at 45w4dhere for labor/TOLAC  1. Labor: active 2. FWB: Cat II 3. Pain: NO, declines anesthesia 4. GBS: neg   Plan: Admit to LD Anticipate VArrington CNM  09/17/2021, 9:20 AM

## 2021-09-17 NOTE — MAU Note (Signed)
Brittany Williams is a 26 y.o. at [redacted]w[redacted]d here in MAU reporting: brought to room 130 with complaints of needing to poop. Pt reports contractions started 1 hour ago. No LOF.   FHT 178  Shay Payne CNM at bedside for VE. 8.5cm  Onset of complaint: today

## 2021-09-18 LAB — GLUCOSE, CAPILLARY: Glucose-Capillary: 92 mg/dL (ref 70–99)

## 2021-09-18 MED ORDER — ETONOGESTREL 68 MG ~~LOC~~ IMPL: 68.0000 mg | DRUG_IMPLANT | Freq: Once | SUBCUTANEOUS | Status: DC

## 2021-09-18 MED ORDER — LIDOCAINE HCL 1 % IJ SOLN: 0.0000 mL | Freq: Once | INTRAMUSCULAR | Status: DC | PRN

## 2021-09-18 NOTE — Progress Notes (Signed)
Post Partum Day 1 Subjective: no complaints, voiding, and feeling tired. She reports some pain but declines pain control at this time. Bleeding has decreased and she is having smaller clots.   Objective: Blood pressure 96/60, pulse 79, temperature 97.8 F (36.6 C), temperature source Oral, resp. rate 18, last menstrual period 12/21/2020, SpO2 98 %, unknown if currently breastfeeding.  Physical Exam:  General: alert Lochia: appropriate Uterine Fundus: firm DVT Evaluation: No evidence of DVT seen on physical exam.  Recent Labs    09/17/21 0919  HGB 14.0  HCT 40.4    Assessment/Plan: Discharge home   LOS: 1 day   Brittany Williams Brittany Williams 09/18/2021, 7:43 AM

## 2021-09-18 NOTE — Lactation Note (Signed)
This note was copied from a baby's chart. Lactation Consultation Note  Patient Name: Brittany Williams [id number] Date: 09/18/2021 Reason for consult: Other (Comment) (per mom having soreness, coconut provided and mom declined [place] visit prior to D/C.) Age:28 hours  Maternal Data    Feeding Mother's Current Feeding Choice: Breast Milk  LATCH Score                    Lactation Tools Discussed/Used    Interventions    Discharge    Consult Status Consult Status: Complete Date: 09/18/21    Myer Haff 09/18/2021, 2:37 PM

## 2021-09-19 ENCOUNTER — Telehealth: Payer: Self-pay | Admitting: Emergency Medicine

## 2021-09-19 NOTE — Telephone Encounter (Signed)
Pt apt changed to 6/15 for nexplanon insertion only.

## 2021-09-21 ENCOUNTER — Encounter: Payer: Medicaid Other | Admitting: Obstetrics

## 2021-09-26 ENCOUNTER — Telehealth (HOSPITAL_COMMUNITY): Payer: Self-pay

## 2021-09-26 NOTE — Telephone Encounter (Signed)
"  I'm doing good." Patient declines questions or concerns about her healing.   "She's good. She is healthy.She sleeps in a bassinet." RN reviewed ABC's of safe sleep with patient. Patient declines any questions or concerns about baby.  EPDS score is 0.  Sharyn Lull [facility] 06/13//2023,1450

## 2021-09-28 ENCOUNTER — Encounter: Payer: Self-pay | Admitting: Obstetrics and Gynecology

## 2021-09-28 ENCOUNTER — Ambulatory Visit (INDEPENDENT_AMBULATORY_CARE_PROVIDER_SITE_OTHER): Payer: Medicaid Other | Admitting: Obstetrics and Gynecology

## 2021-09-28 VITALS — BP 121/79 | HR 77 | Ht 61.0 in | Wt 172.2 lb

## 2021-09-28 DIAGNOSIS — Z30017 Encounter for initial prescription of implantable subdermal contraceptive: Secondary | ICD-10-CM

## 2021-09-28 MED ORDER — ETONOGESTREL 68 MG ~~LOC~~ IMPL: 68.0000 mg | DRUG_IMPLANT | Freq: Once | SUBCUTANEOUS | Status: DC

## 2021-09-28 NOTE — Progress Notes (Signed)
Pt presents for Nexplanon insertion. Pt has no concerns at this time.

## 2021-09-28 NOTE — Patient Instructions (Signed)
Nexplanon Instructions After Insertion  Keep bandage clean and dry for 24 hours  May use ice/Tylenol/Ibuprofen for soreness or pain  If you develop fever, drainage or increased warmth from incision site-contact office immediately  Etonogestrel Implant What is this medication? ETONOGESTREL (et oh noe JES trel) prevents ovulation and pregnancy. It belongs to a group of medications called contraceptives. This medication is a progestin hormone. This medicine may be used for other purposes; ask your health care provider or pharmacist if you have questions. COMMON BRAND NAME(S): Implanon, Nexplanon What should I tell my care team before I take this medication? They need to know if you have any of these conditions: Abnormal vaginal bleeding Blood vessel disease or blood clots Breast, cervical, endometrial, ovarian, liver, or uterine cancer Diabetes Gallbladder disease Heart disease or recent heart attack High blood pressure High cholesterol or triglycerides Kidney disease Liver disease Migraine headaches Seizures Stroke Tobacco smoker An unusual or allergic reaction to etonogestrel, anesthetics or antiseptics, other medications, foods, dyes, or preservatives Pregnant or trying to get pregnant Breast-feeding How should I use this medication? This device is inserted just under the skin on the inner side of your upper arm by your care team. Talk to your care team about the use of this medication in children. Special care may be needed. Overdosage: If you think you have taken too much of this medicine contact a poison control center or emergency room at once. NOTE: This medicine is only for you. Do not share this medicine with others. What if I miss a dose? This does not apply. What may interact with this medication? Do not take this medication with any of the following: Amprenavir Fosamprenavir This medication may also interact with the  following: Acitretin Aprepitant Armodafinil Bexarotene Bosentan Carbamazepine Certain medications for fungal infections like fluconazole, ketoconazole, itraconazole and voriconazole Certain medications to treat hepatitis, HIV or AIDS Cyclosporine Felbamate Griseofulvin Lamotrigine Modafinil Oxcarbazepine Phenobarbital Phenytoin Primidone Rifabutin Rifampin Rifapentine St. John's wort Topiramate This list may not describe all possible interactions. Give your health care provider a list of all the medicines, herbs, non-prescription drugs, or dietary supplements you use. Also tell them if you smoke, drink alcohol, or use illegal drugs. Some items may interact with your medicine. What should I watch for while using this medication? This product does not protect you against HIV infection (AIDS) or other sexually transmitted diseases. You should be able to feel the implant by pressing your fingertips over the skin where it was inserted. Contact your care team if you cannot feel the implant, and use a non-hormonal birth control method (such as condoms) until your care team confirms that the implant is in place. Contact your care team if you think that the implant may have broken or become bent while in your arm. You will receive a user card from your care team after the implant is inserted. The card is a record of the location of the implant in your upper arm and when it should be removed. Keep this card with your health records. What side effects may I notice from receiving this medication? Side effects that you should report to your care team as soon as possible: Allergic reactions--skin rash, itching, hives, swelling of the face, lips, tongue, or throat Blood clot--pain, swelling, or warmth in the leg, shortness of breath, chest pain Gallbladder problems--severe stomach pain, nausea, vomiting, fever Increase in blood pressure Liver injury--right upper belly pain, loss of appetite,  nausea, light-colored stool, dark yellow or brown urine, yellowing  skin or eyes, unusual weakness or fatigue New or worsening migraines or headaches Pain, redness, or irritation at injection site Stroke--sudden numbness or weakness of the face, arm, or leg, trouble speaking, confusion, trouble walking, loss of balance or coordination, dizziness, severe headache, change in vision Unusual vaginal discharge, itching, or odor Worsening mood, feelings of depression Side effects that usually do not require medical attention (report to your care team if they continue or are bothersome): Breast pain or tenderness Dark patches of skin on the face or other sun-exposed areas Irregular menstrual cycles or spotting Nausea Weight gain This list may not describe all possible side effects. Call your doctor for medical advice about side effects. You may report side effects to FDA at 1-800-FDA-1088. Where should I keep my medication? This medication is given in a hospital or clinic and will not be stored at home. NOTE: This sheet is a summary. It may not cover all possible information. If you have questions about this medicine, talk to your doctor, pharmacist, or health care provider.  2023 Elsevier/Gold Standard (2021-03-03 00:00:00)

## 2021-09-28 NOTE — Progress Notes (Signed)
GYNECOLOGY OFFICE PROCEDURE NOTE   [name] is a 26 y.o. [phone] here for Nexplanon insertion. No complaints.  BP 121/79   Pulse 77   Ht 5\' 1"  (1.549 m)   Wt 172 lb 3.2 oz (78.1 kg)   LMP 12/21/2020 (Exact Date)   BMI 32.54 kg/m    No results found for this or any previous visit (from the past 24 hour(s)).    Nexplanon Insertion Procedure Patient identified, informed consent performed, consent signed.   Patient does understand that irregular bleeding is a very common side effect of this medication. She was advised to have backup contraception for one week after placement. Pregnancy test in clinic today was negative.  Appropriate time out taken.  Patient's left arm was prepped and draped in the usual sterile fashion. The ruler used to measure and mark insertion area.  Patient was prepped with alcohol swab and then injected with 3 ml of 1% lidocaine.  She was prepped with betadine, Nexplanon removed from packaging,  Device confirmed in needle, then inserted full length of needle and withdrawn per handbook instructions. Nexplanon was able to palpated in the patient's arm; patient palpated the insert herself. There was minimal blood loss.  Patient insertion site covered with guaze and a pressure bandage to reduce any bruising.  The patient tolerated the procedure well and was given post procedure instructions. Follow-up via My Chart video visit , unless having problems and need to be seen in the office.  Nexplanon Lot#: 02/20/2021 / Expiration Date: 09/02/2023  Total time spent  discussing procedure, explaining r/b and performing procedure was 10 minutes. There was 5 minutes of chart review time spent prior to this encounter. Total time spent = 15 minutes.   09/04/2023, CNM  09/28/2021 11:35 AM

## 2021-10-31 ENCOUNTER — Ambulatory Visit: Payer: Medicaid Other | Admitting: Obstetrics

## 2021-11-09 ENCOUNTER — Ambulatory Visit: Payer: Medicaid Other | Admitting: Student

## 2021-11-22 NOTE — Progress Notes (Signed)
Patient did not present for appointment

## 2021-11-28 ENCOUNTER — Ambulatory Visit (INDEPENDENT_AMBULATORY_CARE_PROVIDER_SITE_OTHER): Payer: Medicaid Other | Admitting: Obstetrics and Gynecology

## 2021-11-28 ENCOUNTER — Encounter: Payer: Self-pay | Admitting: Obstetrics and Gynecology

## 2021-11-28 ENCOUNTER — Other Ambulatory Visit (HOSPITAL_COMMUNITY)
Admission: RE | Admit: 2021-11-28 | Discharge: 2021-11-28 | Disposition: A | Discharge status: Home / Self Care | Payer: Medicaid Other | Source: Ambulatory Visit | Attending: Obstetrics | Admitting: Obstetrics

## 2021-11-28 NOTE — Progress Notes (Signed)
Post Partum Visit Note  LYNSEE WANDS is a 26 y.o. [phone] female who presents for a postpartum visit. She is 10 weeks postpartum following a normal spontaneous vaginal delivery.  I have fully reviewed the prenatal and intrapartum course. The delivery was at 38 gestational weeks.  Anesthesia: none. Postpartum course has been adjusting well, but having some emotional days. Baby is doing well. Baby is feeding by breast. Bleeding no bleeding. Bowel function is normal. Bladder function is normal. Patient is sexually active. Contraception method is Nexplanon. Postpartum depression screening: negative.   The pregnancy intention screening data noted above was reviewed. Potential methods of contraception were discussed. The patient elected to proceed with No data recorded.   Edinburgh Postnatal Depression Scale - 11/28/21 1425       Edinburgh Postnatal Depression Scale:  In the Past 7 Days   I have been able to laugh and see the funny side of things. 0    I have looked forward with enjoyment to things. 0    I have blamed myself unnecessarily when things went wrong. 0    I have been anxious or worried for no good reason. 0    I have felt scared or panicky for no good reason. 0    Things have been getting on top of me. 0    I have been so unhappy that I have had difficulty sleeping. 0    I have felt sad or miserable. 0    I have been so unhappy that I have been crying. 0    The thought of harming myself has occurred to me. 0    Edinburgh Postnatal Depression Scale Total 0             Health Maintenance Due  Topic Date Due   URINE MICROALBUMIN  Never done   COVID-19 Vaccine (2 - Pfizer series) 01/23/2021   INFLUENZA VACCINE  11/14/2021    The following portions of the patient's history were reviewed and updated as appropriate: allergies, current medications, past family history, past medical history, past social history, past surgical history, and problem list.  Review of  Systems Pertinent items are noted in HPI.  Objective:  BP 114/69   Pulse 82   Ht 5\' 1"  (1.549 m)   Wt 178 lb 12.8 oz (81.1 kg)   LMP 12/21/2020 (Exact Date)   Breastfeeding Yes   BMI 33.78 kg/m    General:  alert, cooperative, and no distress   Breasts:  not indicated  Lungs: clear to auscultation bilaterally  Heart:  regular rate and rhythm  Abdomen: soft, non-tender; bowel sounds normal; no masses,  no organomegaly   Wound N/a  GU exam:  normal      Pap taken Assessment:     normal postpartum exam.   Plan:   Essential components of care per ACOG recommendations:  1.  Mood and well being: Patient with negative depression screening today. Reviewed local resources for support.  - Patient tobacco use? No.   - hx of drug use? No.    2. Infant care and feeding:  -Patient currently breastmilk feeding? Yes. Reviewed importance of draining breast regularly to support lactation.  -Social determinants of health (SDOH) reviewed in EPIC.   3. Sexuality, contraception and birth spacing - Patient does not want a pregnancy in the next year.  Desired family size is 3 children.  - Reviewed reproductive life planning. Reviewed contraceptive methods based on pt preferences and effectiveness.  Patient desired  Hormonal Implant today.   - Discussed birth spacing of 18 months  4. Sleep and fatigue -Encouraged family/partner/community support of 4 hrs of uninterrupted sleep to help with mood and fatigue  5. Physical Recovery  - Discussed patients delivery and complications. She describes her labor as good. - Patient had a Vaginal, no problems at delivery. Patient had a  small periclitoral  laceration. Perineal healing reviewed. Patient expressed understanding - Patient has urinary incontinence? No. - Patient is safe to resume physical and sexual activity  6.  Health Maintenance - HM due items addressed Yes - Last pap smear  Diagnosis  Date Value Ref Range Status  03/01/2021 -  Non-diagnostic (A)  Final   Pap smear done at today's visit.  -Breast Cancer screening indicated? No.   7. Chronic Disease/Pregnancy Condition follow up: Gestational Diabetes  - PCP follow up  Warden Fillers, MD [facility], [facility]

## 2021-12-04 LAB — CYTOLOGY - PAP: Diagnosis: NEGATIVE

## 2021-12-13 ENCOUNTER — Other Ambulatory Visit: Payer: Medicaid Other

## 2021-12-25 ENCOUNTER — Telehealth: Payer: Medicaid Other | Admitting: Physician Assistant

## 2021-12-25 ENCOUNTER — Other Ambulatory Visit: Payer: Medicaid Other

## 2021-12-25 DIAGNOSIS — B9689 Other specified bacterial agents as the cause of diseases classified elsewhere: Secondary | ICD-10-CM

## 2021-12-25 DIAGNOSIS — J019 Acute sinusitis, unspecified: Secondary | ICD-10-CM | POA: Diagnosis not present

## 2021-12-25 DIAGNOSIS — J9801 Acute bronchospasm: Secondary | ICD-10-CM | POA: Diagnosis not present

## 2021-12-25 DIAGNOSIS — O99815 Abnormal glucose complicating the puerperium: Secondary | ICD-10-CM

## 2021-12-26 LAB — GLUCOSE TOLERANCE, 2 HOURS
Glucose, 2 hour: 104 mg/dL (ref 70–139)
Glucose, GTT - Fasting: 74 mg/dL (ref 70–99)

## 2021-12-26 MED ORDER — AMOXICILLIN 875 MG PO TABS: 875.0000 mg | ORAL_TABLET | Freq: Two times a day (BID) | ORAL | 0 refills | Status: AC

## 2021-12-26 MED ORDER — ALBUTEROL SULFATE HFA 108 (90 BASE) MCG/ACT IN AERS: 2.0000 | INHALATION_SPRAY | Freq: Four times a day (QID) | RESPIRATORY_TRACT | 0 refills | Status: AC | PRN

## 2021-12-26 NOTE — Progress Notes (Signed)
E-Visit for Sinus Problems  We are sorry that you are not feeling well.  Here is how we plan to help!  Based on what you have shared with me it looks like you have sinusitis.  Sinusitis is inflammation and infection in the sinus cavities of the head.  Based on your presentation I believe you most likely have Acute Bacterial Sinusitis.  This is an infection caused by bacteria and is treated with antibiotics. I have prescribed Amoxicillin twice daily for 10 days (this is safe for pregnancy and breastfeeding). You may use plain Mucinex for congestion. Avoid decongestants and limit antihistamines due to possibility of reducing milk supply.. Saline nasal spray help and can safely be used as often as needed for congestion.  If you develop worsening sinus pain, fever or notice severe headache and vision changes, or if symptoms are not better after completion of antibiotic, please schedule an appointment with a health care provider.    I have sent in an albuterol inhaler to use as directed to help calm down bronchospasm, cough and wheezing.   Sinus infections are not as easily transmitted as other respiratory infection, however we still recommend that you avoid close contact with loved ones, especially the very young and elderly.  Remember to wash your hands thoroughly throughout the day as this is the number one way to prevent the spread of infection!  Home Care: Only take medications as instructed by your medical team. Complete the entire course of an antibiotic. Do not take these medications with alcohol. A steam or ultrasonic humidifier can help congestion.  You can place a towel over your head and breathe in the steam from hot water coming from a faucet. Avoid close contacts especially the very young and the elderly. Cover your mouth when you cough or sneeze. Always remember to wash your hands.  Get Help Right Away If: You develop worsening fever or sinus pain. You develop a severe head ache or  visual changes. Your symptoms persist after you have completed your treatment plan.  Make sure you Understand these instructions. Will watch your condition. Will get help right away if you are not doing well or get worse.  Thank you for choosing an e-visit.  Your e-visit answers were reviewed by a board certified advanced clinical practitioner to complete your personal care plan. Depending upon the condition, your plan could have included both over the counter or prescription medications.  Please review your pharmacy choice. Make sure the pharmacy is open so you can pick up prescription now. If there is a problem, you may contact your provider through [employer] and have the prescription routed to another pharmacy.  Your safety is important to [place]. If you have drug allergies check your prescription carefully.   For the next 24 hours you can use MyChart to ask questions about today's visit, request a non-urgent call back, or ask for a work or school excuse. You will get an email in the next two days asking about your experience. I hope that your e-visit has been valuable and will speed your recovery.

## 2021-12-26 NOTE — Progress Notes (Signed)
I have spent 5 minutes in review of e-visit questionnaire, review and updating patient chart, medical decision making and response to patient.   Ananias Kolander Cody Ryver Poblete, PA-C    

## 2022-01-03 ENCOUNTER — Telehealth: Payer: Medicaid Other | Admitting: Physician Assistant

## 2022-01-03 DIAGNOSIS — B379 Candidiasis, unspecified: Secondary | ICD-10-CM

## 2022-01-03 DIAGNOSIS — T3695XA Adverse effect of unspecified systemic antibiotic, initial encounter: Secondary | ICD-10-CM

## 2022-01-04 MED ORDER — FLUCONAZOLE 150 MG PO TABS: 150.0000 mg | ORAL_TABLET | Freq: Once | ORAL | 0 refills | Status: AC

## 2022-01-04 NOTE — Progress Notes (Signed)

## 2022-01-04 NOTE — Progress Notes (Signed)
I have spent 5 minutes in review of e-visit questionnaire, review and updating patient chart, medical decision making and response to patient.   Sharona Rovner Cody Hagen Tidd, PA-C    

## 2022-02-06 ENCOUNTER — Telehealth: Payer: Medicaid Other | Admitting: Physician Assistant

## 2022-02-06 DIAGNOSIS — J069 Acute upper respiratory infection, unspecified: Secondary | ICD-10-CM | POA: Diagnosis not present

## 2022-02-06 MED ORDER — BENZONATATE 100 MG PO CAPS: 100.0000 mg | ORAL_CAPSULE | Freq: Three times a day (TID) | ORAL | 0 refills | Status: DC | PRN

## 2022-02-06 MED ORDER — FLUTICASONE PROPIONATE 50 MCG/ACT NA SUSP: 2.0000 | Freq: Every day | NASAL | 0 refills | Status: DC

## 2022-02-06 NOTE — Progress Notes (Signed)
I have spent 5 minutes in review of e-visit questionnaire, review and updating patient chart, medical decision making and response to patient.   Theoden Mauch Cody Kaneisha Ellenberger, PA-C    

## 2022-02-06 NOTE — Progress Notes (Signed)
E-Visit for Upper Respiratory Infection   We are sorry you are not feeling well.  Here is how we plan to help!  Based on what you have shared with me, it looks like you may have a viral upper respiratory infection.  Upper respiratory infections are caused by a large number of viruses; however, rhinovirus is the most common cause.   Symptoms vary from person to person, with common symptoms including sore throat, cough, fatigue or lack of energy and feeling of general discomfort.  A low-grade fever of up to 100.4 may present, but is often uncommon.  Symptoms vary however, and are closely related to a person's age or underlying illnesses.  The most common symptoms associated with an upper respiratory infection are nasal discharge or congestion, cough, sneezing, headache and pressure in the ears and face.  These symptoms usually persist for about 3 to 10 days, but can last up to 2 weeks.  It is important to know that upper respiratory infections do not cause serious illness or complications in most cases.    Upper respiratory infections can be transmitted from person to person, with the most common method of transmission being a person's hands.  The virus is able to live on the skin and can infect other persons for up to 2 hours after direct contact.  Also, these can be transmitted when someone coughs or sneezes; thus, it is important to cover the mouth to reduce this risk.  To keep the spread of the illness at bay, good hand hygiene is very important.  This is an infection that is most likely caused by a virus. There are no specific treatments other than to help you with the symptoms until the infection runs its course.  We are sorry you are not feeling well.  Here is how we plan to help!   For nasal congestion, you may use an oral decongestants such as Mucinex D or if you have glaucoma or high blood pressure use plain Mucinex.  Saline nasal spray or nasal drops can help and can safely be used as often as  needed for congestion.  The drainage is likely causing increase in reflux which is causing the heartburn. Treating drainage should help this which in turn should also help with cough, but you can take an OTC Pepcid for a few days if needed. Avoid late-night eating and elevate the head of your bed.   If you do not have a history of heart disease, hypertension, diabetes or thyroid disease, prostate/bladder issues or glaucoma, you may also use Sudafed to treat nasal congestion.  It is highly recommended that you consult with a pharmacist or your primary care physician to ensure this medication is safe for you to take.     If you have a cough, you may use cough suppressants such as Delsym and Robitussin.    If you have a sore or scratchy throat, use a saltwater gargle-  to  teaspoon of salt dissolved in a 4-ounce to 8-ounce glass of warm water.  Gargle the solution for approximately 15-30 seconds and then spit.  It is important not to swallow the solution.  You can also use throat lozenges/cough drops and Chloraseptic spray to help with throat pain or discomfort.  Warm or cold liquids can also be helpful in relieving throat pain.  For headache, pain or general discomfort, you can use Ibuprofen or Tylenol as directed.   Some authorities believe that zinc sprays or the use of Echinacea may shorten  the course of your symptoms.   HOME CARE Only take medications as instructed by your medical team. Be sure to drink plenty of fluids. Water is fine as well as fruit juices, sodas and electrolyte beverages. You may want to stay away from caffeine or alcohol. If you are nauseated, try taking small sips of liquids. How do you know if you are getting enough fluid? Your urine should be a pale yellow or almost colorless. Get rest. Taking a steamy shower or using a humidifier may help nasal congestion and ease sore throat pain. You can place a towel over your head and breathe in the steam from hot water coming from a  faucet. Using a saline nasal spray works much the same way. Cough drops, hard candies and sore throat lozenges may ease your cough. Avoid close contacts especially the very young and the elderly Cover your mouth if you cough or sneeze Always remember to wash your hands.   GET HELP RIGHT AWAY IF: You develop worsening fever. If your symptoms do not improve within 10 days You develop yellow or green discharge from your nose over 3 days. You have coughing fits You develop a severe head ache or visual changes. You develop shortness of breath, difficulty breathing or start having chest pain Your symptoms persist after you have completed your treatment plan  MAKE SURE YOU  Understand these instructions. Will watch your condition. Will get help right away if you are not doing well or get worse.  Thank you for choosing an e-visit.  Your e-visit answers were reviewed by a board certified advanced clinical practitioner to complete your personal care plan. Depending upon the condition, your plan could have included both over the counter or prescription medications.  Please review your pharmacy choice. Make sure the pharmacy is open so you can pick up prescription now. If there is a problem, you may contact your provider through [employer] and have the prescription routed to another pharmacy.  Your safety is important to [place]. If you have drug allergies check your prescription carefully.   For the next 24 hours you can use MyChart to ask questions about today's visit, request a non-urgent call back, or ask for a work or school excuse. You will get an email in the next two days asking about your experience. I hope that your e-visit has been valuable and will speed your recovery.

## 2022-02-21 ENCOUNTER — Telehealth: Payer: Self-pay

## 2022-02-21 ENCOUNTER — Ambulatory Visit: Payer: Medicaid Other | Admitting: Obstetrics

## 2022-02-21 NOTE — Telephone Encounter (Signed)
Patient called and stated that she is currently breastfeeding, and requested a mammogram for the left breast. Patient stated for 3 months, off and on, she can feel a clog in her left breast with sharp pain throughout her left breast to her upper back. She denies fever, chills, or any other symptoms. Patient states when she releases, her left breast feels better temporarily, but when she feels the clog, her nipple remains hard. Patient states this is her 3 rd baby, and has never experience this with breastfeeding. Patient informed to contact her OB/GYN. [facility] insurance). Patient verbalized understanding.

## 2022-04-16 HISTORY — PX: COSMETIC SURGERY: SHX468

## 2022-04-21 ENCOUNTER — Telehealth: Payer: Medicaid Other | Admitting: Nurse Practitioner

## 2022-04-21 DIAGNOSIS — H6591 Unspecified nonsuppurative otitis media, right ear: Secondary | ICD-10-CM | POA: Diagnosis not present

## 2022-04-21 MED ORDER — AMOXICILLIN 875 MG PO TABS: 875.0000 mg | ORAL_TABLET | Freq: Two times a day (BID) | ORAL | 0 refills | Status: AC

## 2022-04-21 MED ORDER — FLUTICASONE PROPIONATE 50 MCG/ACT NA SUSP: 2.0000 | Freq: Every day | NASAL | 0 refills | Status: AC

## 2022-04-21 NOTE — Progress Notes (Signed)
E-Visit for Ear Pain - Acute Otitis Media   We are sorry that you are not feeling well. Here is how we plan to help!  Based on what you have shared with me it looks like you have Acute Otitis Media.  Acute Otitis Media is an infection of the middle or "inner" ear. This type of infection can cause redness, inflammation, and fluid buildup behind the tympanic membrane (ear drum).  The usual symptoms include: Earache/Pain Fever Upper respiratory symptoms Lack of energy/Fatigue/Malaise Slight hearing loss gradually worsening- if the inner ear fills with fluid What causes middle ear infections? Most middle ear infections occur when an infection such as a cold, leads to a build-up of mucus in the middle ear and causes the Eustachian tube (a thin tube that runs from the middle ear to the back of the nose) to become swollen or blocked.   This means mucus can't drain away properly, making it easier for an infection to spread into the middle ear.  How middle ear infections are treated: Most ear infections clear up within three to five days and don't need any specific treatment. If necessary, tylenol or ibuprofen should be used to relieve pain and a high temperature.  If you develop a fever higher than 102, or any significantly worsening symptoms, this could indicate a more serious infection moving to the middle/inner and needs face to face evaluation in an office by a provider.   Antibiotics aren't routinely used to treat middle ear infections, although they may occasionally be prescribed if symptoms persist or are particularly severe. Given your presentation,   I have prescribed Amoxicillin 875 mg one tablet twice daily for 10 days And I have also sent in a nasal spray.    Your symptoms should improve over the next 3 days and should resolve in about 7 days. Be sure to complete ALL of the prescription(s) given.  HOME CARE: Wash your hands frequently. If you are prescribed an ear drop, do not place  the tip of the bottle on your ear or touch it with your fingers. You can take Acetaminophen 650 mg every 4-6 hours as needed for pain.  If pain is severe or moderate, you can apply a heating pad (set on low) or hot water bottle (wrapped in a towel) to outer ear for 20 minutes.  This will also increase drainage.  GET HELP RIGHT AWAY IF: Fever is over 102.2 degrees. You develop progressive ear pain or hearing loss. Ear symptoms persist longer than 3 days after treatment.  MAKE SURE YOU: Understand these instructions. Will watch your condition. Will get help right away if you are not doing well or get worse.  Thank you for choosing an e-visit.  Your e-visit answers were reviewed by a board certified advanced clinical practitioner to complete your personal care plan. Depending upon the condition, your plan could have included both over the counter or prescription medications.  Please review your pharmacy choice. Make sure the pharmacy is open so you can pick up the prescription now. If there is a problem, you may contact your provider through [employer] and have the prescription routed to another pharmacy.  Your safety is important to [place]. If you have drug allergies check your prescription carefully.   For the next 24 hours you can use MyChart to ask questions about today's visit, request a non-urgent call back, or ask for a work or school excuse. You will get an email with a survey after your eVisit asking  about your experience. We would appreciate your feedback. I hope that your e-visit has been valuable and will aid in your recovery.

## 2022-04-21 NOTE — Progress Notes (Signed)
I have spent 5 minutes in review of e-visit questionnaire, review and updating patient chart, medical decision making and response to patient.  ° °Abhijay Morriss W Kimarie Coor, NP ° °  °

## 2022-05-10 ENCOUNTER — Other Ambulatory Visit: Payer: Self-pay | Admitting: Obstetrics

## 2022-05-10 DIAGNOSIS — O99210 Obesity complicating pregnancy, unspecified trimester: Secondary | ICD-10-CM

## 2022-08-06 ENCOUNTER — Ambulatory Visit (INDEPENDENT_AMBULATORY_CARE_PROVIDER_SITE_OTHER): Payer: Medicaid Other | Admitting: Obstetrics

## 2022-08-06 VITALS — BP 107/69 | HR 79 | Ht 61.0 in | Wt 185.6 lb

## 2022-08-06 DIAGNOSIS — Z3046 Encounter for surveillance of implantable subdermal contraceptive: Secondary | ICD-10-CM

## 2022-08-06 MED ORDER — NORELGESTROMIN-ETH ESTRADIOL 150-35 MCG/24HR TD PTWK: 1.0000 | MEDICATED_PATCH | TRANSDERMAL | 12 refills | Status: DC

## 2022-08-06 NOTE — Progress Notes (Addendum)
Subjective:    Brittany Williams is a 27 y.o. female who presents for contraception counseling. The patient has no complaints today. The patient is sexually active. Pertinent past medical history: none.  The information documented in the HPI was reviewed and verified.  Menstrual History: OB History     Gravida  3   Para  3   Term  3   Preterm      AB      Living  3      SAB      IAB      Ectopic      Multiple  0   Live Births  3            No LMP recorded.   Patient Active Problem List   Diagnosis Date Noted   Indication for care in labor and delivery, antepartum 09/17/2021   VBAC (vaginal birth after Cesarean) 09/17/2021   Gestational diabetes 08/21/2021   Obesity in pregnancy, antepartum 03/01/2021   Supervision of other normal pregnancy, antepartum 02/15/2021   Previous cesarean delivery, antepartum 08/20/2017   Past Medical History:  Diagnosis Date   Abnormal Pap smear    Anxiety    Asthma    Chlamydia    Gestational diabetes    Gonorrhea    History of gestational diabetes in prior pregnancy, currently pregnant 06/01/2017   HgA1C at 9 weeks 5.2   Hypoglycemia    Ovarian cyst     Past Surgical History:  Procedure Laterality Date   APPENDECTOMY     CESAREAN SECTION N/A 08/20/2017   Procedure: CESAREAN SECTION;  Surgeon: Tereso Newcomer, MD;  [facility]: [facility];  Service: Obstetrics;  Laterality: N/A;   CYSTECTOMY     WISDOM TOOTH EXTRACTION       Current Outpatient Medications:    norelgestromin-ethinyl estradiol Burr Medico) 150-35 MCG/24HR transdermal patch, Place 1 patch onto the skin once a week., Disp: 3 patch, Rfl: 12   albuterol (VENTOLIN HFA) 108 (90 Base) MCG/ACT inhaler, Inhale 2 puffs into the lungs every 6 (six) hours as needed for wheezing or shortness of breath. (Patient not taking: Reported on 08/06/2022), Disp: 8 g, Rfl: 0   fluticasone (FLONASE) 50 MCG/ACT nasal spray, Place 2 sprays into both nostrils daily. (Patient not  taking: Reported on 08/06/2022), Disp: 16 g, Rfl: 0   Prenatal 27-1 MG TABS, Take 1 tablet by mouth daily before breakfast. (Patient not taking: Reported on 08/06/2022), Disp: 30 tablet, Rfl: 11  Current Facility-Administered Medications:    etonogestrel (NEXPLANON) implant 68 mg, 68 mg, Subdermal, Once, Arita Miss, Beaver, CNM Allergies  Allergen Reactions   Latex Itching and Rash    Social History   Tobacco Use   Smoking status: Former    Packs/day: .5    Types: Cigarettes    Quit date: 09/23/2014    Years since quitting: 7.8   Smokeless tobacco: Never  Substance Use Topics   Alcohol use: Not Currently    Comment: not since confirmed pregnancy    Family History  Problem Relation Age of Onset   Cancer Mother    Diabetes Maternal Grandmother    Diabetes Maternal Grandfather    Hypertension Other    Diabetes Other    Other Neg Hx        Review of Systems Constitutional: negative for weight loss Genitourinary:negative for abnormal menstrual periods and vaginal discharge   Objective:   BP 107/69   Pulse 79   Ht  (1.549 m)  Wt 185 lb 9.6 oz (84.2 kg)   BMI 35.07 kg/m    General:   alert  Skin:   no rash or abnormalities  Lungs:   clear to auscultation bilaterally  Heart:   regular rate and rhythm, S1, S2 normal, no murmur, click, rub or gallop  Breasts:   normal without suspicious masses, skin or nipple changes or axillary nodes  Abdomen:  normal findings: no organomegaly, soft, non-tender and no hernia  Pelvis:  External genitalia: normal general appearance Urinary system: urethral meatus normal and bladder without fullness, nontender Vaginal: normal without tenderness, induration or masses Cervix: normal appearance Adnexa: normal bimanual exam Uterus: anteverted and non-tender, normal size   Lab Review Urine pregnancy test Labs reviewed yes Radiologic studies reviewed no  I have spent a total of 20 minutes of face-to-face time, excluding clinical staff  time, reviewing notes and preparing to see patient, ordering tests and/or medications, and counseling the patient.   Assessment:    27 y.o., discontinuing Nexplanon, and starting IUD.  No contraindications.  Plan:    All questions answered. Contraception: IUD. Discussed healthy lifestyle modifications. Follow up as needed.  Meds ordered this encounter  Medications   norelgestromin-ethinyl estradiol Burr Medico) 150-35 MCG/24HR transdermal patch    Sig: Place 1 patch onto the skin once a week.    Dispense:  3 patch    Refill:  12   No orders of the defined types were placed in this encounter.  Brock Bad, MD 08/06/2022 1:52 PM

## 2022-08-06 NOTE — Progress Notes (Signed)
Pt getting nexplanon removed for surgical procedure. Wants pills until IUD,  insertion in June.

## 2022-08-14 ENCOUNTER — Ambulatory Visit (INDEPENDENT_AMBULATORY_CARE_PROVIDER_SITE_OTHER): Payer: Medicaid Other | Admitting: Certified Nurse Midwife

## 2022-08-14 ENCOUNTER — Encounter: Payer: Self-pay | Admitting: Certified Nurse Midwife

## 2022-08-14 VITALS — BP 130/84 | HR 95 | Ht 61.0 in | Wt 190.2 lb

## 2022-08-14 DIAGNOSIS — Z30430 Encounter for insertion of intrauterine contraceptive device: Secondary | ICD-10-CM | POA: Diagnosis not present

## 2022-08-14 MED ORDER — LEVONORGESTREL 20 MCG/DAY IU IUD
1.0000 | INTRAUTERINE_SYSTEM | Freq: Once | INTRAUTERINE | Status: AC
  Administered 2022-08-14: 1 via INTRAUTERINE

## 2022-08-14 NOTE — Progress Notes (Addendum)
    GYNECOLOGY OFFICE PROCEDURE NOTE  Brittany Williams is a 27 y.o. [phone] here for Mirena IUD insertion. No GYN concerns.  Last pap smear was on 11/28/2021 and was normal.  IUD Insertion Procedure Note Patient identified, informed consent performed, consent signed.   Discussed risks of irregular bleeding, cramping, infection, malpositioning or misplacement of the IUD outside the uterus which may require further procedure such as laparoscopy. Also discussed >99% contraception efficacy, increased risk of ectopic pregnancy with failure of method.   Emphasized that this did not protect against STIs, condoms recommended during all sexual encounters. Time out was performed.  Urine pregnancy test negative.  Speculum placed in the vagina.  Cervix visualized.  Cleaned with Betadine x 3.  Grasped anteriorly with a single tooth tenaculum.  Uterus sounded to 8 cm.  Mirena IUD placed per manufacturer's recommendations.  Strings trimmed to 3 cm. Tenaculum was removed, good hemostasis noted.  Patient tolerated procedure well.   Patient was given post-procedure instructions.  She was advised to have backup contraception for one week.  Patient was also asked to check IUD strings periodically and follow up in 4 weeks for IUD check.  S/N [id number] Exp: 2026/ May  Lot# [id number]  Brittany Williams (Danella Deis) Suzie Portela, MSN, CNM  [facility]  08/14/22 9:02 AM

## 2022-08-14 NOTE — Addendum Note (Signed)
Addended by: Sharlyne Pacas on: 08/14/2022 09:38 AM   Modules accepted: Orders

## 2022-08-14 NOTE — Progress Notes (Signed)
Pt presents for IUD insertion. No concerns.

## 2022-09-04 ENCOUNTER — Other Ambulatory Visit: Payer: Self-pay | Admitting: Obstetrics

## 2022-09-04 DIAGNOSIS — J301 Allergic rhinitis due to pollen: Secondary | ICD-10-CM

## 2022-09-12 ENCOUNTER — Ambulatory Visit: Payer: Medicaid Other | Admitting: Obstetrics & Gynecology

## 2022-09-18 ENCOUNTER — Ambulatory Visit: Payer: Medicaid Other | Admitting: Obstetrics and Gynecology

## 2022-09-26 ENCOUNTER — Ambulatory Visit: Payer: Medicaid Other | Admitting: Obstetrics & Gynecology

## 2022-09-26 VITALS — BP 109/75 | HR 92 | Wt 189.0 lb

## 2022-09-26 DIAGNOSIS — Z30431 Encounter for routine checking of intrauterine contraceptive device: Secondary | ICD-10-CM

## 2022-09-26 NOTE — Progress Notes (Signed)
Patient ID: Brittany Williams, female   DOB: [birth year], 27 y.o.   MRN: [id number]  Chief Complaint  Patient presents with   Contraception  String check  HPI Brittany Williams is a 27 y.o. female.  [id number] She had LNGIUD placed 4/30 and comes for string check HPI  Past Medical History:  Diagnosis Date   Abnormal Pap smear    Anxiety    Asthma    Chlamydia    Gestational diabetes    Gonorrhea    History of gestational diabetes in prior pregnancy, currently pregnant 06/01/2017   HgA1C at 9 weeks 5.2   Hypoglycemia    Ovarian cyst     Past Surgical History:  Procedure Laterality Date   APPENDECTOMY     CESAREAN SECTION N/A 08/20/2017   Procedure: CESAREAN SECTION;  Surgeon: Tereso Newcomer, MD;  [facility]: [facility];  Service: Obstetrics;  Laterality: N/A;   COSMETIC SURGERY  2024   CYSTECTOMY     WISDOM TOOTH EXTRACTION      Family History  Problem Relation Age of Onset   Cancer Mother    Diabetes Maternal Grandmother    Diabetes Maternal Grandfather    Hypertension Other    Diabetes Other    Other Neg Hx     Social History Social History   Tobacco Use   Smoking status: Former    Packs/day: .5    Types: Cigarettes    Quit date: 09/23/2014    Years since quitting: 8.0   Smokeless tobacco: Never  Vaping Use   Vaping Use: Never used  Substance Use Topics   Alcohol use: Not Currently    Comment: not since confirmed pregnancy   Drug use: Not Currently    Types: Marijuana    Comment: not since confirmed pregnancy    Allergies  Allergen Reactions   Latex Itching and Rash    Current Outpatient Medications  Medication Sig Dispense Refill   albuterol (VENTOLIN HFA) 108 (90 Base) MCG/ACT inhaler Inhale 2 puffs into the lungs every 6 (six) hours as needed for wheezing or shortness of breath. (Patient not taking: Reported on 08/06/2022) 8 g 0   fluticasone (FLONASE) 50 MCG/ACT nasal spray Place 2 sprays into both nostrils daily. (Patient not taking: Reported  on 08/06/2022) 16 g 0   loratadine (CLARITIN) 10 MG tablet TAKE 1 TABLET BY MOUTH EVERY DAY 30 tablet 11   norelgestromin-ethinyl estradiol (XULANE) 150-35 MCG/24HR transdermal patch Place 1 patch onto the skin once a week. (Patient not taking: Reported on 08/14/2022) 3 patch 12   Prenatal 27-1 MG TABS Take 1 tablet by mouth daily before breakfast. 30 tablet 11   Current Facility-Administered Medications  Medication Dose Route Frequency Provider Last Rate Last Admin   etonogestrel (NEXPLANON) implant 68 mg  68 mg Subdermal Once Raelyn Mora, CNM        Review of Systems Review of Systems  Genitourinary:  Negative for menstrual problem, pelvic pain and vaginal discharge.    Blood pressure 109/75, pulse 92, weight 189 lb (85.7 kg), currently breastfeeding.  Physical Exam Physical Exam Vitals and nursing note reviewed. Exam conducted with a chaperone present.  Constitutional:      Appearance: Normal appearance.  Pulmonary:     Effort: Pulmonary effort is normal.  Genitourinary:    General: Normal vulva.     Exam position: Lithotomy position.     Vagina: Normal. No vaginal discharge.     Cervix: Normal.     Uterus: Normal.  Comments: String is palpable at the os, had been cut short    Data Reviewed   Assessment IUD check up, doing well  Plan Routine care, last pap nl 11/2021    Scheryl Darter 09/26/2022, 10:27 AM

## 2022-09-26 NOTE — Progress Notes (Signed)
Pt is in office for IUD check. Pt states partner can feel strings. Pt states she has had a few episodes of irregular bleeding.  Pt just recently stopped breastfeeding.

## 2022-09-27 ENCOUNTER — Ambulatory Visit: Payer: Medicaid Other | Admitting: Obstetrics and Gynecology

## 2022-11-27 ENCOUNTER — Ambulatory Visit (INDEPENDENT_AMBULATORY_CARE_PROVIDER_SITE_OTHER): Payer: PRIVATE HEALTH INSURANCE | Admitting: Obstetrics & Gynecology

## 2022-11-27 VITALS — BP 106/70 | HR 79 | Wt 185.0 lb

## 2022-11-27 DIAGNOSIS — Z3046 Encounter for surveillance of implantable subdermal contraceptive: Secondary | ICD-10-CM

## 2022-11-27 DIAGNOSIS — Z30432 Encounter for removal of intrauterine contraceptive device: Secondary | ICD-10-CM

## 2022-11-27 MED ORDER — NORELGESTROMIN-ETH ESTRADIOL 150-35 MCG/24HR TD PTWK: 1.0000 | MEDICATED_PATCH | TRANSDERMAL | 12 refills | Status: DC

## 2022-11-27 NOTE — Progress Notes (Signed)
Patient ID: Brittany Williams, female   DOB: September 29, 1995, 27 y.o.   MRN: [id number]    GYNECOLOGY OFFICE PROCEDURE NOTE  CYRENA MORMANDO is a 27 y.o. [id number] here for  IUD removal. No GYN concerns. She has mood disturbance that she attributes to the levonorgestrel IUD Last pap smear was on 11/2021 and was normal.  IUD Removal  Patient identified, informed consent performed, consent signed.   Chaperone present.  Patient was placed in the dorsal lithotomy position, normal external genitalia was noted.  A speculum was placed in the patient's vagina, normal discharge was noted, no lesions. The cervix was visualized, no lesions, no abnormal discharge.  The strings of the IUD were grasped and pulled using ring forceps. The IUD was removed in its entirety. Patient tolerated the procedure well.    Patient will use Xulane for contraception.  Routine preventative health maintenance measures emphasized.   Adam Phenix, MD Obstetrician & Gynecologist, [facility] for [facility], [facility]

## 2022-11-27 NOTE — Progress Notes (Signed)
Pt is in office for IUD removal, would like to switch to Patch.  Pt states she has not been doing well emotionally with IUD.

## 2023-05-20 ENCOUNTER — Encounter: Payer: Self-pay | Admitting: Obstetrics and Gynecology

## 2023-05-20 ENCOUNTER — Ambulatory Visit: Payer: Medicaid Other | Admitting: Obstetrics and Gynecology

## 2023-05-20 VITALS — BP 108/72 | HR 96 | Ht 61.0 in | Wt 172.0 lb

## 2023-05-20 DIAGNOSIS — Z30017 Encounter for initial prescription of implantable subdermal contraceptive: Secondary | ICD-10-CM

## 2023-05-20 MED ORDER — ETONOGESTREL 68 MG ~~LOC~~ IMPL
68.0000 mg | DRUG_IMPLANT | Freq: Once | SUBCUTANEOUS | Status: AC
  Administered 2023-05-20: 68 mg via SUBCUTANEOUS

## 2023-05-20 NOTE — Progress Notes (Addendum)
28 y.o. GYN presents for Nexplanon Insertion.  Pt is switching from the Patch.  Administrations This Visit     etonogestrel (NEXPLANON) implant 68 mg     Admin Date 05/20/2023 Action Given Dose 68 mg Route Subdermal Documented By Maretta Bees, RMA

## 2023-05-20 NOTE — Progress Notes (Signed)
28 yo P3 presenting for Nexplanon insertion. Patient is currently using patch for contraception. She has used the nexplanon previously without complaints. Patient is without any other complaints  Past Medical History:  Diagnosis Date   Abnormal Pap smear    Anxiety    Asthma    Chlamydia    Gestational diabetes    Gonorrhea    History of gestational diabetes in prior pregnancy, currently pregnant 06/01/2017   HgA1C at 9 weeks 5.2   Hypoglycemia    Ovarian cyst    Past Surgical History:  Procedure Laterality Date   APPENDECTOMY     CESAREAN SECTION N/A 08/20/2017   Procedure: CESAREAN SECTION;  Surgeon: Tereso Newcomer, MD;  [facility]: [facility];  Service: Obstetrics;  Laterality: N/A;   COSMETIC SURGERY  2024   CYSTECTOMY     WISDOM TOOTH EXTRACTION     Family History  Problem Relation Age of Onset   Cancer Mother    Diabetes Maternal Grandmother    Diabetes Maternal Grandfather    Hypertension Other    Diabetes Other    Other Neg Hx    Social History   Tobacco Use   Smoking status: Former    Current packs/day: 0.00    Types: Cigarettes    Quit date: 09/23/2014    Years since quitting: 8.6   Smokeless tobacco: Never  Vaping Use   Vaping status: Never Used  Substance Use Topics   Alcohol use: Not Currently    Comment: not since confirmed pregnancy   Drug use: Not Currently    Types: Marijuana    Comment: not since confirmed pregnancy   ROS See pertinent in HPI. All other systems reviewed and non contributory Blood pressure 108/72, pulse 96, height 5\' 1"  (1.549 m), weight 172 lb (78 kg), last menstrual period 05/12/2023, currently breastfeeding. GENERAL: Well-developed, well-nourished female in no acute distress.  NEURO: alert and oriented x 3 EXTREMITIES: No cyanosis, clubbing, or edema, 2+ distal pulses.  A/P 28 yo here for Nexplanon insertion Patient given informed consent, signed copy in the chart, time out was performed. Pregnancy test was  negative. Appropriate time out taken.  Patient's left arm was prepped and draped in the usual sterile fashion.. The ruler used to measure and mark insertion area.  Patient was prepped with alcohol swab and then injected with 3 cc of 1% lidocaine with epinephrine.  Patient was prepped with betadine, Nexplanon removed form packaging.  Device confirmed in needle, then inserted full length of needle and withdrawn per handbook instructions.  Patient insertion site covered with band aid and [place].   Minimal blood loss.  Patient tolerated the procedure well. Patient advised to complete current month supply of patch. She will be applying her second patch tomorrow Patient was reminded of the irregular vaginal bleeding that can happen as a normal side effect with Nexplanon without affecting its effectiveness

## 2023-12-05 ENCOUNTER — Ambulatory Visit
Admission: RE | Admit: 2023-12-05 | Discharge: 2023-12-05 | Disposition: A | Discharge status: Home / Self Care | Source: Ambulatory Visit

## 2023-12-05 VITALS — BP 115/79 | HR 87 | Temp 98.0°F | Resp 16

## 2023-12-05 DIAGNOSIS — N898 Other specified noninflammatory disorders of vagina: Secondary | ICD-10-CM

## 2023-12-05 LAB — POCT URINE DIPSTICK
Bilirubin, UA: NEGATIVE
Blood, UA: NEGATIVE
Glucose, UA: NEGATIVE mg/dL
Ketones, POC UA: NEGATIVE mg/dL
Nitrite, UA: NEGATIVE
Protein Ur, POC: NEGATIVE mg/dL
Spec Grav, UA: 1.015 (ref 1.010–1.025)
Urobilinogen, UA: 0.2 U/dL
pH, UA: 7 (ref 5.0–8.0)

## 2023-12-05 LAB — POCT URINE PREGNANCY: Preg Test, Ur: NEGATIVE

## 2023-12-05 NOTE — Discharge Instructions (Signed)
  1. Vaginal lesion (Primary) - HSV 1/2 PCR (Surface) swab collected in UC and sent to lab, results should be available in 1 to 2 days - POCT urine pregnancy complete and UC is negative - POCT URINE DIPSTICK completed in UC shows a trace leukocytes, no blood, no nitrite, no sign of urinary infection - Cervicovaginal swab collected in UC and sent to lab for further testing results should be available in 2 to 3 days. - Results should be available on MyChart if any abnormal results you will be contacted and appropriate guidance provided.

## 2023-12-05 NOTE — ED Provider Notes (Signed)
 [facility]  Note:  This document was prepared using Dragon voice recognition software and may include unintentional dictation errors.  MRN: [id number] DOB: 16-Feb-1996  Subjective:   Brittany Williams is a 28 y.o. female presenting for for painful labial lesion x 2 to 3 days.  Patient reports that she has had similar lesions in the past and would like HSV testing.  Patient denies having testing previously.  Patient has been using over-the-counter and homemade topical treatment with minimal improvement.  Patient is concern for HSV infection.  Patient would also like other STD screening as well.  Denies any vaginal discharge, internal vaginal pain, abdominal pain, flank pain, dysuria, increased urinary frequency.  Patient also reports a similar presentation to lesion to her external anus.  No current facility-administered medications for this encounter.  Current Outpatient Medications:    albuterol  (VENTOLIN  HFA) 108 (90 Base) MCG/ACT inhaler, Inhale 2 puffs into the lungs every 6 (six) hours as needed for wheezing or shortness of breath. (Patient not taking: Reported on 08/06/2022), Disp: 8 g, Rfl: 0   fluticasone  (FLONASE ) 50 MCG/ACT nasal spray, Place 2 sprays into both nostrils daily. (Patient not taking: Reported on 08/06/2022), Disp: 16 g, Rfl: 0   loratadine  (CLARITIN ) 10 MG tablet, TAKE 1 TABLET BY MOUTH EVERY DAY, Disp: 30 tablet, Rfl: 11   norelgestromin -ethinyl estradiol  (XULANE) 150-35 MCG/24HR transdermal patch, Place 1 patch onto the skin once a week., Disp: 3 patch, Rfl: 12   Prenatal 27-1 MG TABS, Take 1 tablet by mouth daily before breakfast., Disp: 30 tablet, Rfl: 11   Allergies  Allergen Reactions   Latex Itching and Rash    Past Medical History:  Diagnosis Date   Abnormal Pap smear    Anxiety    Asthma    Chlamydia    Gestational diabetes    Gonorrhea    History of gestational diabetes in prior pregnancy, currently pregnant 06/01/2017   HgA1C at 9  weeks 5.2   Hypoglycemia    Ovarian cyst      Past Surgical History:  Procedure Laterality Date   APPENDECTOMY     CESAREAN SECTION N/A 08/20/2017   Procedure: CESAREAN SECTION;  Surgeon: Herchel Gloris LABOR, MD;  [facility]: [facility];  Service: Obstetrics;  Laterality: N/A;   COSMETIC SURGERY  2024   CYSTECTOMY     WISDOM TOOTH EXTRACTION      Family History  Problem Relation Age of Onset   Cancer Mother    Diabetes Maternal Grandmother    Diabetes Maternal Grandfather    Hypertension Other    Diabetes Other    Other Neg Hx     Social History   Tobacco Use   Smoking status: Former    Current packs/day: 0.00    Types: Cigarettes    Quit date: 09/23/2014    Years since quitting: 9.2   Smokeless tobacco: Never  Vaping Use   Vaping status: Never Used  Substance Use Topics   Alcohol use: Not Currently    Comment: not since confirmed pregnancy   Drug use: Not Currently    Types: Marijuana    Comment: not since confirmed pregnancy    ROS Refer to HPI for ROS details.  Objective:    Vitals: BP 115/79 (BP Location: Right Arm)   Pulse 87   Temp 98 F (36.7 C) (Oral)   Resp 16   SpO2 98%   Physical Exam Vitals and nursing note reviewed. Exam conducted with a chaperone present.  Constitutional:      General: She is not in acute distress.    Appearance: Normal appearance. She is not ill-appearing.  HENT:     Head: Normocephalic.  Cardiovascular:     Rate and Rhythm: Normal rate.  Pulmonary:     Effort: Pulmonary effort is normal. No respiratory distress.  Abdominal:     General: There is no distension.     Palpations: Abdomen is soft.     Tenderness: There is no abdominal tenderness. There is no right CVA tenderness or left CVA tenderness.  Genitourinary:    Labia:        Right: Tenderness and lesion present. No rash.    Skin:    General: Skin is warm and dry.     Capillary Refill: Capillary refill takes less than 2 seconds.  Neurological:      General: No focal deficit present.     Mental Status: She is alert and oriented to person, place, and time.  Psychiatric:        Mood and Affect: Mood normal.        Behavior: Behavior normal.     Procedures  Results for orders placed or performed during the hospital encounter of 12/05/23 (from the past 24 hours)  POCT urine pregnancy     Status: Normal   Collection Time: 12/05/23 10:49 AM  Result Value Ref Range   Preg Test, Ur Negative Negative  POCT URINE DIPSTICK     Status: Abnormal   Collection Time: 12/05/23 10:56 AM  Result Value Ref Range   Color, UA yellow yellow   Clarity, UA clear clear   Glucose, UA negative negative mg/dL   Bilirubin, UA negative negative   Ketones, POC UA negative negative mg/dL   Spec Grav, UA 8.984 8.989 - 1.025   Blood, UA negative negative   pH, UA 7.0 5.0 - 8.0   Protein Ur, POC negative negative mg/dL   Urobilinogen, UA 0.2 0.2 or 1.0 E.U./dL   Nitrite, UA Negative Negative   Leukocytes, UA Trace (A) Negative    Assessment and Plan :     Discharge Instructions       1. Vaginal lesion (Primary) - HSV 1/2 PCR (Surface) swab collected in UC and sent to lab, results should be available in 1 to 2 days - POCT urine pregnancy complete and UC is negative - POCT URINE DIPSTICK completed in UC shows a trace leukocytes, no blood, no nitrite, no sign of urinary infection - Cervicovaginal swab collected in UC and sent to lab for further testing results should be available in 2 to 3 days. - Results should be available on MyChart if any abnormal results you will be contacted and appropriate guidance provided.      Zeva Leber B Johan Creveling   Burna Atlas, [place], [place] 12/05/23 [phone]

## 2023-12-05 NOTE — ED Triage Notes (Signed)
 Pt presents for HSV testing. States she has blister like bumps near perianal that starting 3 days ago. States she has had break out in past

## 2023-12-06 LAB — CERVICOVAGINAL ANCILLARY ONLY
Bacterial Vaginitis (gardnerella): POSITIVE — AB
Candida Glabrata: NEGATIVE
Candida Vaginitis: NEGATIVE
Chlamydia: NEGATIVE
Comment: NEGATIVE
Comment: NEGATIVE
Comment: NEGATIVE
Comment: NEGATIVE
Comment: NEGATIVE
Comment: NORMAL
Neisseria Gonorrhea: NEGATIVE
Trichomonas: NEGATIVE

## 2023-12-06 LAB — HSV 1/2 PCR (SURFACE)
HSV-1 DNA: NOT DETECTED
HSV-2 DNA: DETECTED — AB

## 2023-12-09 ENCOUNTER — Telehealth: Payer: Self-pay | Admitting: Internal Medicine

## 2023-12-09 ENCOUNTER — Encounter: Payer: Self-pay | Admitting: Internal Medicine

## 2023-12-09 DIAGNOSIS — B9689 Other specified bacterial agents as the cause of diseases classified elsewhere: Secondary | ICD-10-CM

## 2023-12-09 DIAGNOSIS — B009 Herpesviral infection, unspecified: Secondary | ICD-10-CM

## 2023-12-09 MED ORDER — METRONIDAZOLE 500 MG PO TABS: 500.0000 mg | ORAL_TABLET | Freq: Two times a day (BID) | ORAL | 0 refills | Status: DC

## 2023-12-09 NOTE — Telephone Encounter (Signed)
 Patient called urgent care requesting to discuss results that she saw on her MyChart from recent urgent care visit where she was seen on December 05, 2023 for vaginal lesion and vaginal discharge.  Returned patient's call using 2 patient identifiers to go over results.  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 Patient states that her HSV lesion has fully healed at this time and declines Valtrex prescription. She states these lesions happen once a month around the time of her menstrual cycle. I advised her to speak with her primary care provider to discuss suppressive therapy with an antiviral around the time of her menstrual cycle to prevent frequent HSV outbreaks. Advised to notify sexual partners of positive test result.  Flagyl  has been sent in to treat BV.  Advised to avoid alcohol intake within 72 hours of taking Flagyl .  Follow-up with PCP as discussed, all questions answered.

## 2023-12-13 ENCOUNTER — Ambulatory Visit (HOSPITAL_COMMUNITY): Payer: Self-pay

## 2024-01-09 ENCOUNTER — Ambulatory Visit: Admitting: Obstetrics and Gynecology

## 2024-01-09 ENCOUNTER — Ambulatory Visit (INDEPENDENT_AMBULATORY_CARE_PROVIDER_SITE_OTHER): Admitting: Obstetrics and Gynecology

## 2024-01-09 ENCOUNTER — Encounter: Payer: Self-pay | Admitting: Obstetrics and Gynecology

## 2024-01-09 VITALS — BP 115/71 | HR 84 | Ht 61.0 in | Wt 157.4 lb

## 2024-01-09 DIAGNOSIS — Z30016 Encounter for initial prescription of transdermal patch hormonal contraceptive device: Secondary | ICD-10-CM

## 2024-01-09 DIAGNOSIS — Z3046 Encounter for surveillance of implantable subdermal contraceptive: Secondary | ICD-10-CM

## 2024-01-09 DIAGNOSIS — N898 Other specified noninflammatory disorders of vagina: Secondary | ICD-10-CM

## 2024-01-09 MED ORDER — NORELGESTROMIN-ETH ESTRADIOL 150-35 MCG/24HR TD PTWK: 1.0000 | MEDICATED_PATCH | TRANSDERMAL | 15 refills | Status: AC

## 2024-01-09 NOTE — Progress Notes (Signed)
     GYNECOLOGY OFFICE PROCEDURE NOTE  Brittany Williams is a 28 y.o. [phone] here for Nexplanon  removal. Last pap smear was on 11/28/21 and was normal.  No other gynecologic concerns.  Nexplanon  Removal Patient identified, informed consent performed, consent signed.   Appropriate time out taken.   Nexplanon  site identified.  Area prepped in usual sterile fashon. Two ml of 1% lidocaine  was used to anesthetize the area at the distal end of the implant. A small stab incision was made right beside the implant on the distal portion.  The Nexplanon  rod was grasped using hemostats and removed without difficulty.  There was minimal blood loss. There were no complications.   Steri-strips were applied over the small incision.  A pressure bandage was applied to reduce any bruising.    The patient tolerated the procedure well and was given post procedure instructions.  Patient is planning to use birth control patch   [facility] for [employer], [facility]

## 2024-01-09 NOTE — Progress Notes (Signed)
 Pt presents for Nexplanon  removal  Nexplanon  placed 05/2023 Pt wants BC that allows periods  Pt wants to switch to [facility] patches

## 2024-01-10 ENCOUNTER — Other Ambulatory Visit (HOSPITAL_COMMUNITY)
Admission: RE | Admit: 2024-01-10 | Discharge: 2024-01-10 | Disposition: A | Discharge status: Home / Self Care | Source: Ambulatory Visit | Attending: Obstetrics and Gynecology | Admitting: Obstetrics and Gynecology

## 2024-01-10 DIAGNOSIS — N898 Other specified noninflammatory disorders of vagina: Secondary | ICD-10-CM | POA: Insufficient documentation

## 2024-01-10 NOTE — Addendum Note (Signed)
 Addended by: Delawrence Fridman V on: 01/10/2024 11:59 AM   Modules accepted: Orders

## 2024-01-13 ENCOUNTER — Ambulatory Visit: Payer: Self-pay | Admitting: Obstetrics and Gynecology

## 2024-01-13 ENCOUNTER — Other Ambulatory Visit: Payer: Self-pay | Admitting: Obstetrics and Gynecology

## 2024-01-13 DIAGNOSIS — B9689 Other specified bacterial agents as the cause of diseases classified elsewhere: Secondary | ICD-10-CM

## 2024-01-13 LAB — CERVICOVAGINAL ANCILLARY ONLY
Bacterial Vaginitis (gardnerella): POSITIVE — AB
Candida Glabrata: NEGATIVE
Candida Vaginitis: NEGATIVE
Chlamydia: NEGATIVE
Comment: NEGATIVE
Comment: NEGATIVE
Comment: NEGATIVE
Comment: NEGATIVE
Comment: NEGATIVE
Comment: NORMAL
Neisseria Gonorrhea: NEGATIVE
Trichomonas: NEGATIVE

## 2024-01-13 MED ORDER — METRONIDAZOLE 500 MG PO TABS: 500.0000 mg | ORAL_TABLET | Freq: Two times a day (BID) | ORAL | 0 refills | Status: AC

## 2024-01-13 MED ORDER — METRONIDAZOLE 500 MG PO TABS: 500.0000 mg | ORAL_TABLET | Freq: Two times a day (BID) | ORAL | 0 refills | Status: DC

## 2024-01-13 MED ORDER — METRONIDAZOLE 0.75 % VA GEL: 1.0000 | Freq: Every day | VAGINAL | 0 refills | Status: AC

## 2024-01-21 ENCOUNTER — Ambulatory Visit: Admitting: Obstetrics and Gynecology

## 2024-02-14 ENCOUNTER — Ambulatory Visit

## 2024-02-28 IMAGING — US US MFM OB FOLLOW-UP
1 series · 16 of 28 positions shown · non-contrast
Comparison: none

[Series 1: us mfm ob follow-up · 36 acquisitions, 16 frames shown]
[im 1/36]
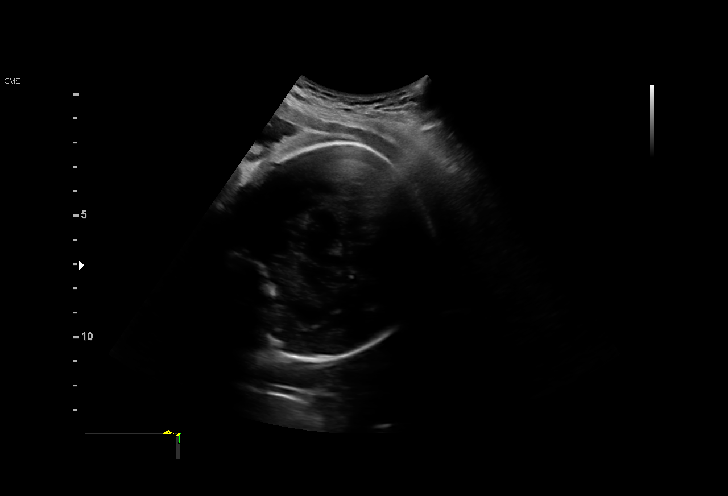
[im 3/36]
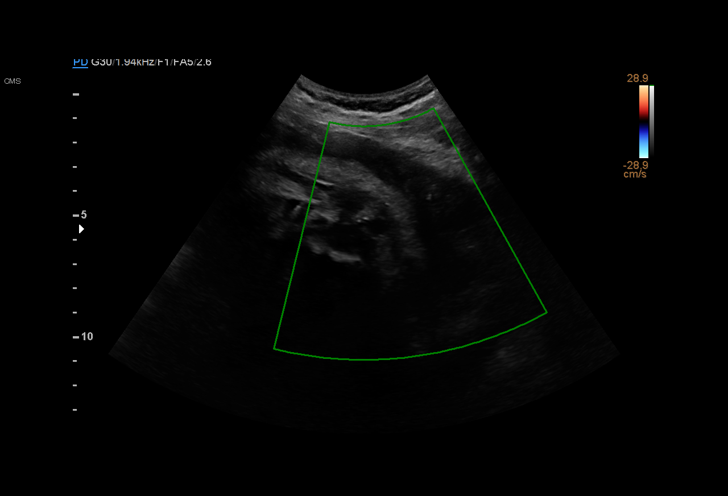
[im 6/36]
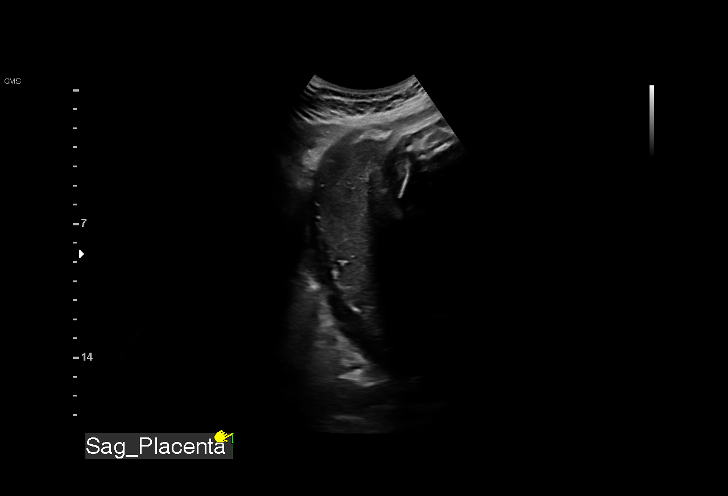
[im 8/36]
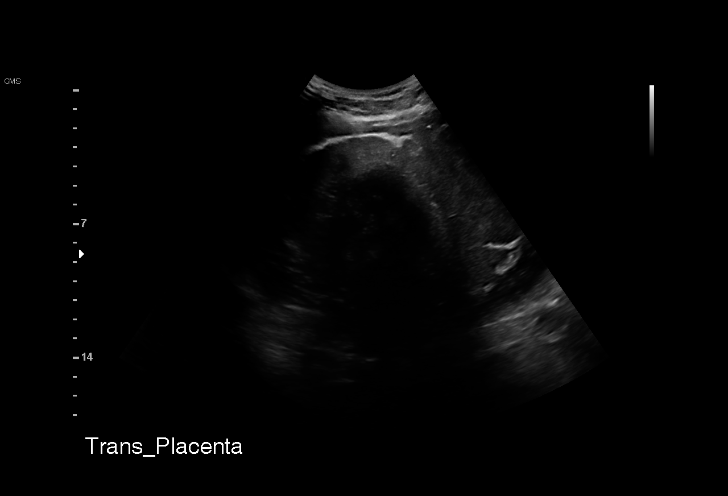
[im 10/36]
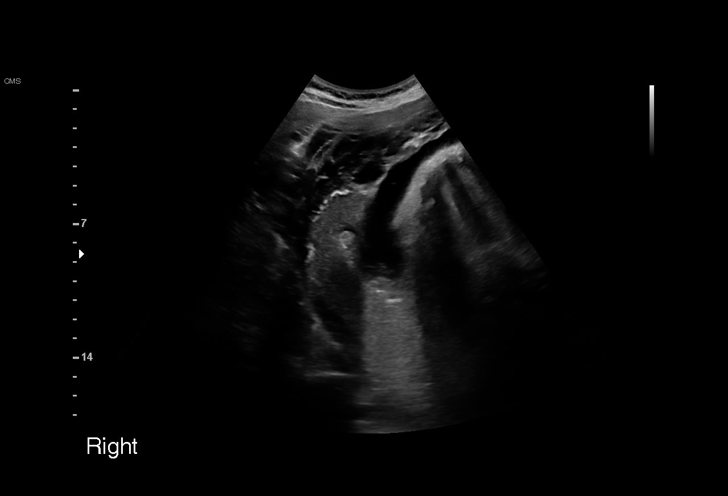
[im 12/36]
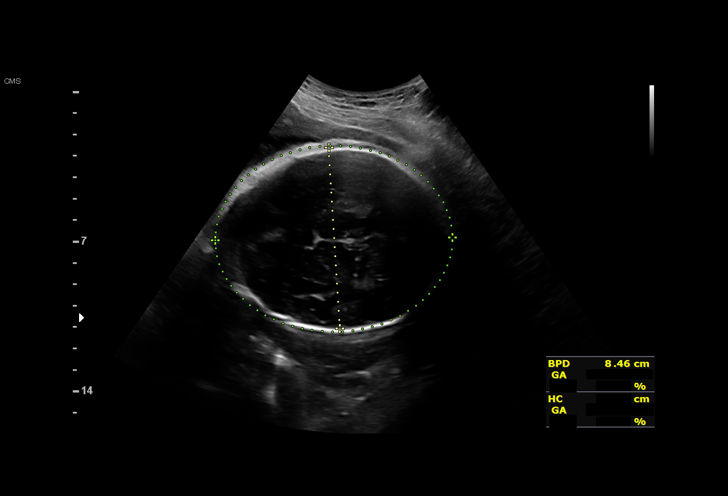
[im 15/36]
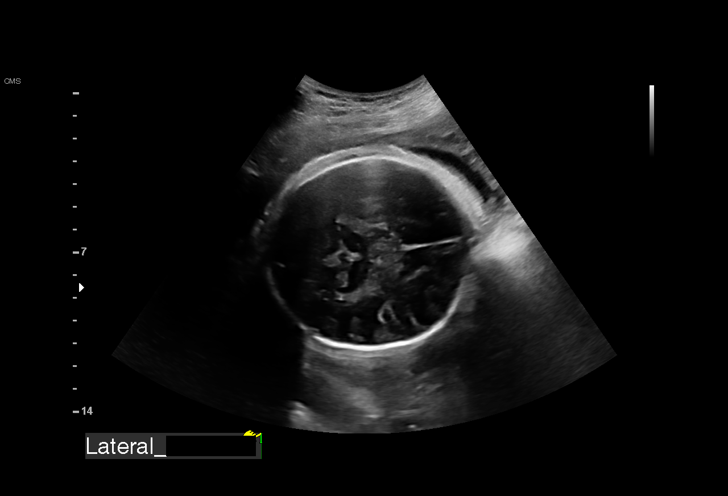
[im 17/36]
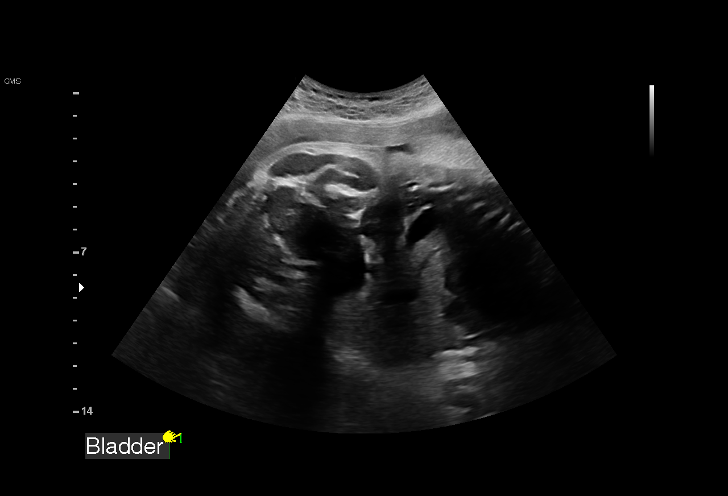
[im 19/36]
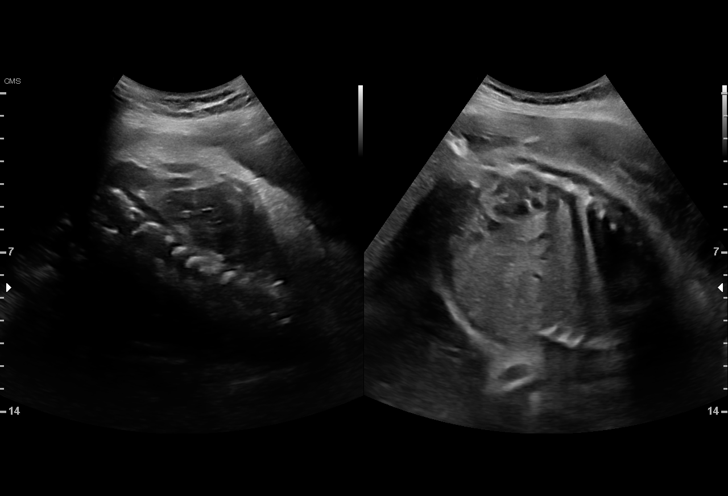
[im 21/36]
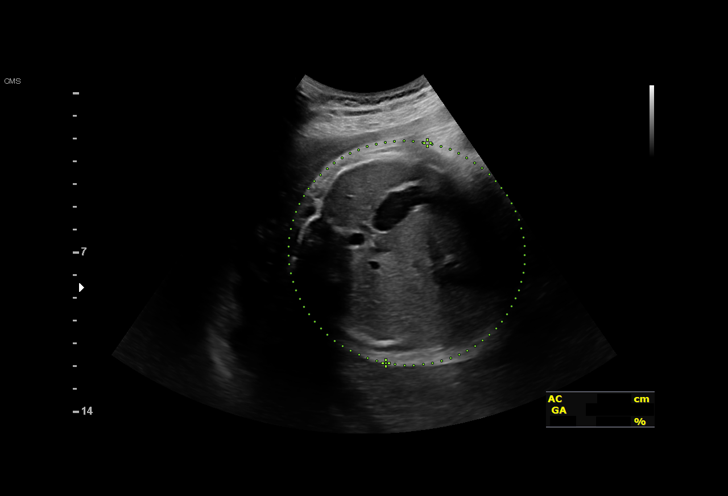
[im 24/36]
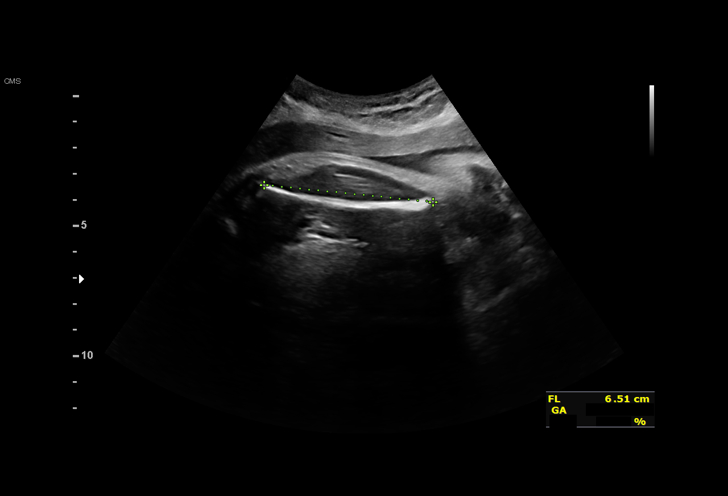
[im 26/36]
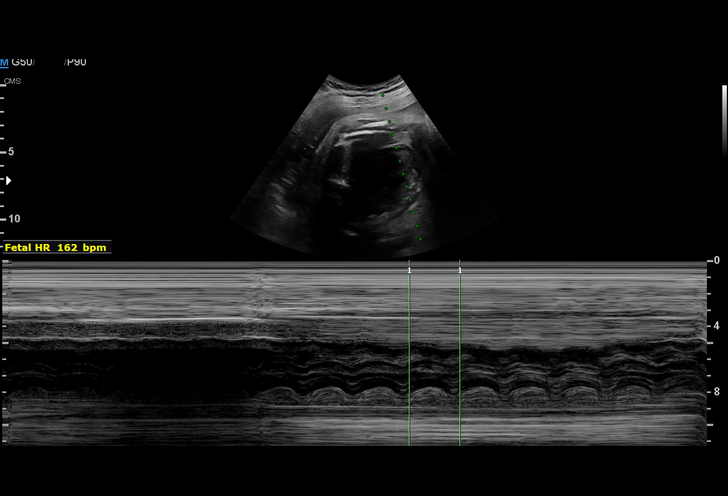
[im 28/36]
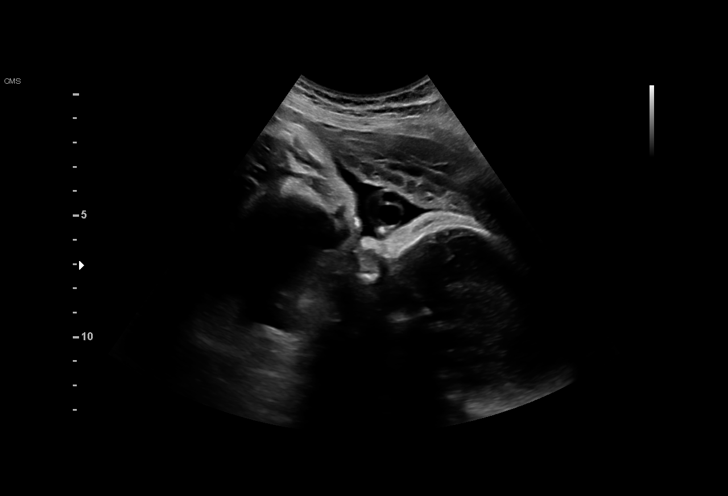
[im 30/36]
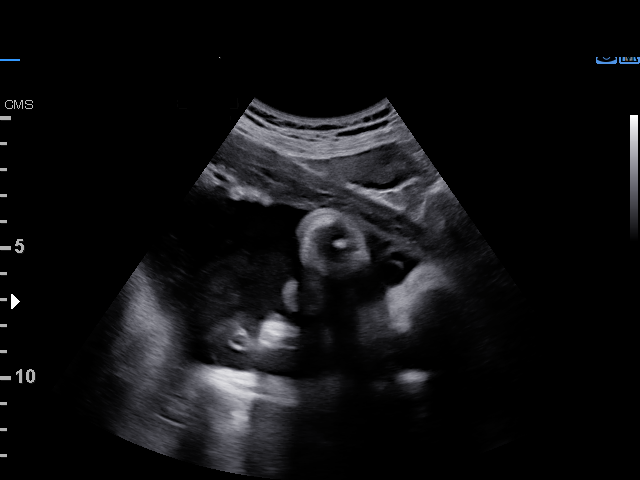
[im 33/36]
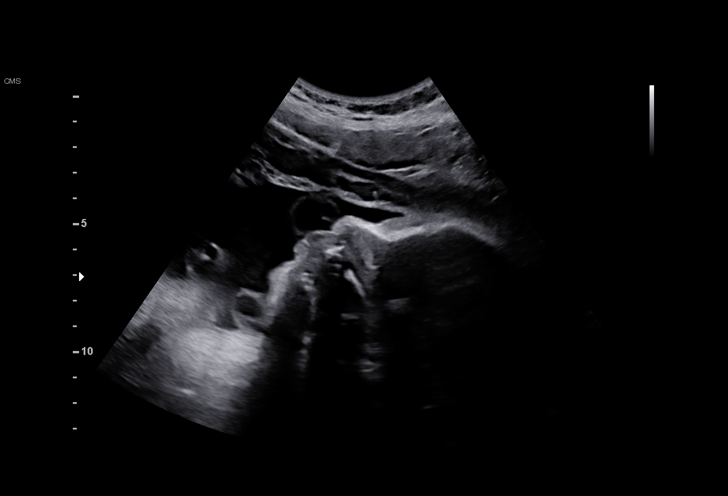
[im 36/36]
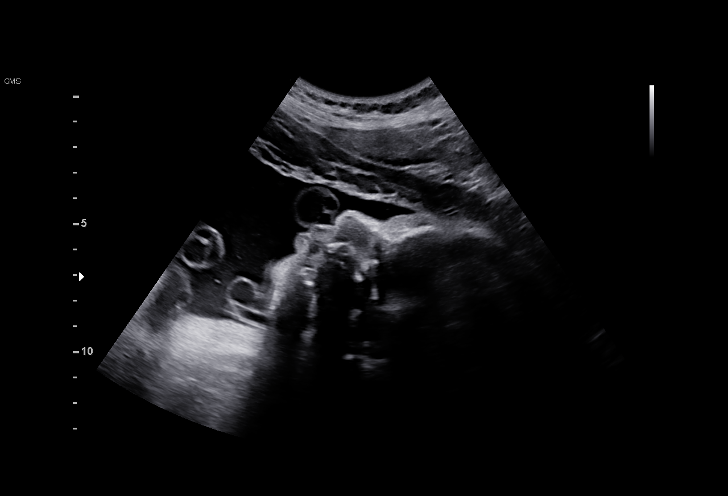

[16 of 28 positions shown; findings below may reference images not displayed]

Indications

 33 weeks gestation of pregnancy
 Gestational diabetes in pregnancy,
 unspecified control
 Obesity complicating pregnancy, third
 trimester (pregravid BMI 32)
 History of cesarean delivery, currently
 pregnant
 Poor obstetric history: Previous gestational
 diabetes
 Encounter for other antenatal screening
 follow-up
 Low Risk NIPS
